# Patient Record
Sex: Female | Born: 1935 | Race: Black or African American | Hispanic: No | Smoking: Never smoker
Health system: Southern US, Community
[De-identification: ages and names within clinical notes are randomized; demographics above are authoritative.]

## PROBLEM LIST (undated history)

## (undated) DIAGNOSIS — N189 Chronic kidney disease, unspecified: Secondary | ICD-10-CM

## (undated) DIAGNOSIS — F0280 Dementia in other diseases classified elsewhere without behavioral disturbance: Secondary | ICD-10-CM

## (undated) DIAGNOSIS — K259 Gastric ulcer, unspecified as acute or chronic, without hemorrhage or perforation: Secondary | ICD-10-CM

## (undated) DIAGNOSIS — R001 Bradycardia, unspecified: Secondary | ICD-10-CM

## (undated) DIAGNOSIS — I96 Gangrene, not elsewhere classified: Secondary | ICD-10-CM

## (undated) DIAGNOSIS — I5020 Unspecified systolic (congestive) heart failure: Secondary | ICD-10-CM

## (undated) DIAGNOSIS — R4702 Dysphasia: Secondary | ICD-10-CM

## (undated) DIAGNOSIS — M1990 Unspecified osteoarthritis, unspecified site: Secondary | ICD-10-CM

## (undated) DIAGNOSIS — K269 Duodenal ulcer, unspecified as acute or chronic, without hemorrhage or perforation: Secondary | ICD-10-CM

## (undated) DIAGNOSIS — G309 Alzheimer's disease, unspecified: Secondary | ICD-10-CM

## (undated) DIAGNOSIS — K275 Chronic or unspecified peptic ulcer, site unspecified, with perforation: Secondary | ICD-10-CM

## (undated) DIAGNOSIS — F329 Major depressive disorder, single episode, unspecified: Secondary | ICD-10-CM

## (undated) DIAGNOSIS — I10 Essential (primary) hypertension: Secondary | ICD-10-CM

## (undated) DIAGNOSIS — T68XXXA Hypothermia, initial encounter: Secondary | ICD-10-CM

## (undated) DIAGNOSIS — F32A Depression, unspecified: Secondary | ICD-10-CM

## (undated) DIAGNOSIS — F419 Anxiety disorder, unspecified: Secondary | ICD-10-CM

## (undated) DIAGNOSIS — E039 Hypothyroidism, unspecified: Secondary | ICD-10-CM

## (undated) DIAGNOSIS — J9600 Acute respiratory failure, unspecified whether with hypoxia or hypercapnia: Secondary | ICD-10-CM

## (undated) DIAGNOSIS — L409 Psoriasis, unspecified: Secondary | ICD-10-CM

## (undated) DIAGNOSIS — I2510 Atherosclerotic heart disease of native coronary artery without angina pectoris: Secondary | ICD-10-CM

## (undated) HISTORY — PX: JOINT REPLACEMENT: SHX530

---

## 1997-09-04 ENCOUNTER — Ambulatory Visit (HOSPITAL_COMMUNITY): Admission: RE | Admit: 1997-09-04 | Discharge: 1997-09-04 | Payer: Self-pay | Admitting: Internal Medicine

## 1997-09-25 ENCOUNTER — Other Ambulatory Visit: Admission: RE | Admit: 1997-09-25 | Discharge: 1997-09-25 | Payer: Self-pay | Admitting: Family Medicine

## 1997-11-11 ENCOUNTER — Ambulatory Visit (HOSPITAL_COMMUNITY): Admission: RE | Admit: 1997-11-11 | Discharge: 1997-11-11 | Payer: Self-pay | Admitting: Family Medicine

## 1997-11-25 ENCOUNTER — Encounter: Admission: RE | Admit: 1997-11-25 | Discharge: 1998-02-23 | Payer: Self-pay | Admitting: Family Medicine

## 1999-03-11 ENCOUNTER — Other Ambulatory Visit: Admission: RE | Admit: 1999-03-11 | Discharge: 1999-03-11 | Payer: Self-pay | Admitting: Family Medicine

## 2000-01-28 ENCOUNTER — Encounter: Payer: Self-pay | Admitting: Family Medicine

## 2000-01-28 ENCOUNTER — Ambulatory Visit (HOSPITAL_COMMUNITY): Admission: RE | Admit: 2000-01-28 | Discharge: 2000-01-28 | Payer: Self-pay | Admitting: Family Medicine

## 2000-02-01 ENCOUNTER — Encounter: Payer: Self-pay | Admitting: Emergency Medicine

## 2000-02-01 ENCOUNTER — Emergency Department (HOSPITAL_COMMUNITY): Admission: EM | Admit: 2000-02-01 | Discharge: 2000-02-02 | Payer: Self-pay | Admitting: Emergency Medicine

## 2000-02-14 ENCOUNTER — Ambulatory Visit (HOSPITAL_COMMUNITY): Admission: RE | Admit: 2000-02-14 | Discharge: 2000-02-14 | Payer: Self-pay | Admitting: Family Medicine

## 2000-02-14 ENCOUNTER — Encounter: Payer: Self-pay | Admitting: Family Medicine

## 2000-06-01 ENCOUNTER — Ambulatory Visit (HOSPITAL_COMMUNITY): Admission: RE | Admit: 2000-06-01 | Discharge: 2000-06-01 | Payer: Self-pay | Admitting: Gastroenterology

## 2000-06-01 ENCOUNTER — Encounter (INDEPENDENT_AMBULATORY_CARE_PROVIDER_SITE_OTHER): Payer: Self-pay

## 2000-06-20 DIAGNOSIS — K275 Chronic or unspecified peptic ulcer, site unspecified, with perforation: Secondary | ICD-10-CM

## 2000-06-20 HISTORY — DX: Chronic or unspecified peptic ulcer, site unspecified, with perforation: K27.5

## 2000-10-11 ENCOUNTER — Encounter: Payer: Self-pay | Admitting: Emergency Medicine

## 2000-10-12 ENCOUNTER — Encounter: Payer: Self-pay | Admitting: Emergency Medicine

## 2000-10-12 ENCOUNTER — Inpatient Hospital Stay (HOSPITAL_COMMUNITY): Admission: EM | Admit: 2000-10-12 | Discharge: 2000-10-19 | Payer: Self-pay | Admitting: Emergency Medicine

## 2001-11-29 ENCOUNTER — Encounter: Admission: RE | Admit: 2001-11-29 | Discharge: 2001-11-29 | Payer: Self-pay | Admitting: Internal Medicine

## 2001-12-24 ENCOUNTER — Ambulatory Visit (HOSPITAL_COMMUNITY): Admission: RE | Admit: 2001-12-24 | Discharge: 2001-12-24 | Payer: Self-pay | Admitting: Internal Medicine

## 2001-12-24 ENCOUNTER — Encounter: Admission: RE | Admit: 2001-12-24 | Discharge: 2001-12-24 | Payer: Self-pay | Admitting: Internal Medicine

## 2002-03-22 ENCOUNTER — Ambulatory Visit (HOSPITAL_COMMUNITY): Admission: RE | Admit: 2002-03-22 | Discharge: 2002-03-23 | Payer: Self-pay | Admitting: Cardiology

## 2002-03-22 ENCOUNTER — Encounter: Payer: Self-pay | Admitting: Cardiology

## 2002-09-09 ENCOUNTER — Encounter: Admission: RE | Admit: 2002-09-09 | Discharge: 2002-09-09 | Payer: Self-pay | Admitting: Internal Medicine

## 2002-10-29 ENCOUNTER — Encounter: Admission: RE | Admit: 2002-10-29 | Discharge: 2002-10-29 | Payer: Self-pay | Admitting: Internal Medicine

## 2003-02-12 ENCOUNTER — Encounter: Admission: RE | Admit: 2003-02-12 | Discharge: 2003-02-12 | Payer: Self-pay | Admitting: Internal Medicine

## 2003-02-18 ENCOUNTER — Encounter: Admission: RE | Admit: 2003-02-18 | Discharge: 2003-02-18 | Payer: Self-pay | Admitting: Internal Medicine

## 2003-05-02 ENCOUNTER — Encounter: Admission: RE | Admit: 2003-05-02 | Discharge: 2003-05-02 | Payer: Self-pay | Admitting: Internal Medicine

## 2004-04-20 ENCOUNTER — Ambulatory Visit: Payer: Self-pay | Admitting: Internal Medicine

## 2004-10-12 ENCOUNTER — Ambulatory Visit: Payer: Self-pay | Admitting: Internal Medicine

## 2004-10-20 ENCOUNTER — Ambulatory Visit (HOSPITAL_COMMUNITY): Admission: RE | Admit: 2004-10-20 | Discharge: 2004-10-20 | Payer: Self-pay | Admitting: Internal Medicine

## 2004-10-20 ENCOUNTER — Ambulatory Visit: Payer: Self-pay | Admitting: Internal Medicine

## 2004-10-22 ENCOUNTER — Ambulatory Visit (HOSPITAL_COMMUNITY): Admission: RE | Admit: 2004-10-22 | Discharge: 2004-10-22 | Payer: Self-pay | Admitting: Internal Medicine

## 2004-11-03 ENCOUNTER — Ambulatory Visit: Payer: Self-pay | Admitting: Internal Medicine

## 2004-11-18 ENCOUNTER — Ambulatory Visit: Payer: Self-pay | Admitting: Internal Medicine

## 2005-01-06 ENCOUNTER — Ambulatory Visit: Payer: Self-pay | Admitting: Internal Medicine

## 2005-01-13 ENCOUNTER — Ambulatory Visit (HOSPITAL_COMMUNITY): Admission: RE | Admit: 2005-01-13 | Discharge: 2005-01-13 | Payer: Self-pay | Admitting: Internal Medicine

## 2005-01-13 ENCOUNTER — Ambulatory Visit: Payer: Self-pay | Admitting: Internal Medicine

## 2005-01-27 ENCOUNTER — Ambulatory Visit: Payer: Self-pay | Admitting: Internal Medicine

## 2005-02-14 ENCOUNTER — Ambulatory Visit: Payer: Self-pay | Admitting: Cardiology

## 2005-02-24 ENCOUNTER — Ambulatory Visit (HOSPITAL_COMMUNITY): Admission: RE | Admit: 2005-02-24 | Discharge: 2005-02-24 | Payer: Self-pay | Admitting: *Deleted

## 2005-02-24 ENCOUNTER — Ambulatory Visit: Payer: Self-pay | Admitting: Cardiology

## 2005-03-02 ENCOUNTER — Ambulatory Visit: Payer: Self-pay | Admitting: Cardiology

## 2005-03-07 ENCOUNTER — Inpatient Hospital Stay (HOSPITAL_BASED_OUTPATIENT_CLINIC_OR_DEPARTMENT_OTHER): Admission: RE | Admit: 2005-03-07 | Discharge: 2005-03-07 | Payer: Self-pay | Admitting: Internal Medicine

## 2005-03-17 ENCOUNTER — Ambulatory Visit: Payer: Self-pay | Admitting: Cardiology

## 2005-06-20 HISTORY — PX: TOTAL HIP ARTHROPLASTY: SHX124

## 2005-07-19 ENCOUNTER — Ambulatory Visit: Payer: Self-pay | Admitting: Hospitalist

## 2005-07-25 ENCOUNTER — Ambulatory Visit: Payer: Self-pay | Admitting: Internal Medicine

## 2005-08-08 ENCOUNTER — Ambulatory Visit: Payer: Self-pay | Admitting: Internal Medicine

## 2005-09-05 ENCOUNTER — Emergency Department (HOSPITAL_COMMUNITY): Admission: EM | Admit: 2005-09-05 | Discharge: 2005-09-05 | Payer: Self-pay | Admitting: Emergency Medicine

## 2005-09-29 ENCOUNTER — Ambulatory Visit: Payer: Self-pay | Admitting: Internal Medicine

## 2005-10-06 ENCOUNTER — Ambulatory Visit: Payer: Self-pay | Admitting: Internal Medicine

## 2005-11-24 ENCOUNTER — Inpatient Hospital Stay (HOSPITAL_COMMUNITY): Admission: RE | Admit: 2005-11-24 | Discharge: 2005-11-27 | Payer: Self-pay | Admitting: Orthopedic Surgery

## 2005-12-09 ENCOUNTER — Ambulatory Visit: Payer: Self-pay | Admitting: Internal Medicine

## 2006-03-08 ENCOUNTER — Ambulatory Visit (HOSPITAL_COMMUNITY): Admission: RE | Admit: 2006-03-08 | Discharge: 2006-03-08 | Payer: Self-pay | Admitting: Internal Medicine

## 2006-03-08 ENCOUNTER — Ambulatory Visit: Payer: Self-pay | Admitting: Internal Medicine

## 2006-03-15 ENCOUNTER — Ambulatory Visit: Payer: Self-pay | Admitting: Internal Medicine

## 2006-04-07 ENCOUNTER — Ambulatory Visit: Payer: Self-pay | Admitting: Internal Medicine

## 2006-04-07 ENCOUNTER — Inpatient Hospital Stay (HOSPITAL_COMMUNITY): Admission: EM | Admit: 2006-04-07 | Discharge: 2006-04-13 | Payer: Self-pay | Admitting: Emergency Medicine

## 2006-04-29 ENCOUNTER — Emergency Department (HOSPITAL_COMMUNITY): Admission: EM | Admit: 2006-04-29 | Discharge: 2006-04-29 | Payer: Self-pay | Admitting: Emergency Medicine

## 2006-06-29 DIAGNOSIS — M1990 Unspecified osteoarthritis, unspecified site: Secondary | ICD-10-CM | POA: Insufficient documentation

## 2006-06-29 DIAGNOSIS — R634 Abnormal weight loss: Secondary | ICD-10-CM

## 2006-06-29 DIAGNOSIS — Z9861 Coronary angioplasty status: Secondary | ICD-10-CM | POA: Insufficient documentation

## 2006-06-29 DIAGNOSIS — L408 Other psoriasis: Secondary | ICD-10-CM | POA: Insufficient documentation

## 2006-06-29 DIAGNOSIS — E7800 Pure hypercholesterolemia, unspecified: Secondary | ICD-10-CM

## 2006-06-29 DIAGNOSIS — K279 Peptic ulcer, site unspecified, unspecified as acute or chronic, without hemorrhage or perforation: Secondary | ICD-10-CM

## 2006-06-29 DIAGNOSIS — E1129 Type 2 diabetes mellitus with other diabetic kidney complication: Secondary | ICD-10-CM | POA: Insufficient documentation

## 2006-06-29 DIAGNOSIS — I10 Essential (primary) hypertension: Secondary | ICD-10-CM | POA: Insufficient documentation

## 2006-06-29 DIAGNOSIS — Z8659 Personal history of other mental and behavioral disorders: Secondary | ICD-10-CM | POA: Insufficient documentation

## 2006-06-29 DIAGNOSIS — Z9079 Acquired absence of other genital organ(s): Secondary | ICD-10-CM | POA: Insufficient documentation

## 2006-06-29 DIAGNOSIS — E669 Obesity, unspecified: Secondary | ICD-10-CM

## 2006-06-29 DIAGNOSIS — Z96649 Presence of unspecified artificial hip joint: Secondary | ICD-10-CM | POA: Insufficient documentation

## 2006-06-29 DIAGNOSIS — R413 Other amnesia: Secondary | ICD-10-CM

## 2006-06-29 DIAGNOSIS — Z9889 Other specified postprocedural states: Secondary | ICD-10-CM | POA: Insufficient documentation

## 2006-08-09 ENCOUNTER — Telehealth: Payer: Self-pay | Admitting: *Deleted

## 2006-08-09 DIAGNOSIS — E785 Hyperlipidemia, unspecified: Secondary | ICD-10-CM | POA: Insufficient documentation

## 2007-01-29 ENCOUNTER — Ambulatory Visit: Payer: Self-pay | Admitting: *Deleted

## 2007-01-29 ENCOUNTER — Encounter (INDEPENDENT_AMBULATORY_CARE_PROVIDER_SITE_OTHER): Payer: Self-pay | Admitting: *Deleted

## 2007-01-29 LAB — CONVERTED CEMR LAB
ALT: 10 units/L (ref 0–35)
Alkaline Phosphatase: 90 units/L (ref 39–117)
BUN: 15 mg/dL (ref 6–23)
Blood Glucose, Fingerstick: 117
CO2: 24 meq/L (ref 19–32)
Creatinine, Ser: 1.05 mg/dL (ref 0.40–1.20)
Glucose, Bld: 78 mg/dL (ref 70–99)
Hemoglobin: 12.6 g/dL (ref 12.0–15.0)
MCV: 79 fL (ref 78.0–100.0)
Platelets: 221 10*3/uL (ref 150–400)
RBC: 4.8 M/uL (ref 3.87–5.11)
Total Protein: 7.1 g/dL (ref 6.0–8.3)
WBC: 5.7 10*3/uL (ref 4.0–10.5)

## 2007-04-12 ENCOUNTER — Encounter: Payer: Self-pay | Admitting: Internal Medicine

## 2007-04-26 ENCOUNTER — Encounter (INDEPENDENT_AMBULATORY_CARE_PROVIDER_SITE_OTHER): Payer: Self-pay | Admitting: *Deleted

## 2007-05-24 ENCOUNTER — Telehealth: Payer: Self-pay | Admitting: Internal Medicine

## 2007-05-31 ENCOUNTER — Ambulatory Visit: Payer: Self-pay | Admitting: Internal Medicine

## 2007-05-31 ENCOUNTER — Encounter: Payer: Self-pay | Admitting: Licensed Clinical Social Worker

## 2007-05-31 DIAGNOSIS — I2510 Atherosclerotic heart disease of native coronary artery without angina pectoris: Secondary | ICD-10-CM

## 2007-05-31 DIAGNOSIS — D126 Benign neoplasm of colon, unspecified: Secondary | ICD-10-CM

## 2007-05-31 LAB — CONVERTED CEMR LAB
ALT: <8 units/L (ref 0–35)
Albumin: 4 g/dL (ref 3.5–5.2)
BUN: 20 mg/dL (ref 6–23)
Basophils Relative: 1 % (ref 0–1)
Blood Glucose, Fingerstick: 121
Chloride: 107 meq/L (ref 96–112)
Cholesterol: 149 mg/dL (ref 0–200)
Creatinine, Ser: 1.05 mg/dL (ref 0.40–1.20)
Eosinophils Absolute: 0.2 10*3/uL (ref 0.2–0.7)
HCT: 37.3 % (ref 36.0–46.0)
HDL: 62 mg/dL (ref >39)
Lymphocytes Relative: 23 % (ref 12–46)
MCHC: 33.2 g/dL (ref 30.0–36.0)
Monocytes Relative: 8 % (ref 3–12)
Neutro Abs: 4 10*3/uL (ref 1.7–7.7)
Potassium: 4.7 meq/L (ref 3.5–5.3)
RBC: 4.62 M/uL (ref 3.87–5.11)
RDW: 16.1 % — ABNORMAL HIGH (ref 11.5–15.5)
Sodium: 143 meq/L (ref 135–145)
TSH: 1.704 microintl units/mL (ref 0.350–5.50)
Total CHOL/HDL Ratio: 2.4
Total Protein: 7.5 g/dL (ref 6.0–8.3)
Triglycerides: 95 mg/dL (ref <150)

## 2007-06-04 LAB — CONVERTED CEMR LAB: OCCULT 3: NEGATIVE

## 2007-06-07 ENCOUNTER — Ambulatory Visit: Payer: Self-pay | Admitting: Internal Medicine

## 2007-06-21 DIAGNOSIS — K259 Gastric ulcer, unspecified as acute or chronic, without hemorrhage or perforation: Secondary | ICD-10-CM

## 2007-06-21 DIAGNOSIS — K269 Duodenal ulcer, unspecified as acute or chronic, without hemorrhage or perforation: Secondary | ICD-10-CM

## 2007-06-21 HISTORY — DX: Duodenal ulcer, unspecified as acute or chronic, without hemorrhage or perforation: K26.9

## 2007-06-21 HISTORY — DX: Gastric ulcer, unspecified as acute or chronic, without hemorrhage or perforation: K25.9

## 2007-06-29 ENCOUNTER — Encounter: Payer: Self-pay | Admitting: Internal Medicine

## 2007-08-07 ENCOUNTER — Telehealth: Payer: Self-pay | Admitting: Internal Medicine

## 2007-08-15 ENCOUNTER — Ambulatory Visit: Payer: Self-pay | Admitting: *Deleted

## 2007-08-15 ENCOUNTER — Encounter: Payer: Self-pay | Admitting: Internal Medicine

## 2007-08-15 ENCOUNTER — Ambulatory Visit: Payer: Self-pay | Admitting: Cardiology

## 2007-08-15 ENCOUNTER — Encounter: Admission: RE | Admit: 2007-08-15 | Discharge: 2007-08-15 | Payer: Self-pay | Admitting: Internal Medicine

## 2007-08-15 ENCOUNTER — Inpatient Hospital Stay (HOSPITAL_COMMUNITY): Admission: AD | Admit: 2007-08-15 | Discharge: 2007-08-18 | Payer: Self-pay | Admitting: *Deleted

## 2007-08-15 ENCOUNTER — Ambulatory Visit: Payer: Self-pay | Admitting: Internal Medicine

## 2007-08-15 DIAGNOSIS — R079 Chest pain, unspecified: Secondary | ICD-10-CM

## 2007-08-15 LAB — CONVERTED CEMR LAB: Hgb A1c MFr Bld: 6.3 %

## 2007-08-16 ENCOUNTER — Encounter (INDEPENDENT_AMBULATORY_CARE_PROVIDER_SITE_OTHER): Payer: Self-pay | Admitting: *Deleted

## 2007-08-17 ENCOUNTER — Encounter: Payer: Self-pay | Admitting: Gastroenterology

## 2007-08-17 ENCOUNTER — Encounter: Payer: Self-pay | Admitting: Internal Medicine

## 2007-08-17 DIAGNOSIS — K2980 Duodenitis without bleeding: Secondary | ICD-10-CM | POA: Insufficient documentation

## 2007-08-17 LAB — HM COLONOSCOPY

## 2007-08-20 ENCOUNTER — Ambulatory Visit: Payer: Self-pay | Admitting: Gastroenterology

## 2007-08-23 ENCOUNTER — Encounter: Payer: Self-pay | Admitting: Internal Medicine

## 2007-08-23 LAB — HM DIABETES EYE EXAM

## 2007-09-14 DIAGNOSIS — K5730 Diverticulosis of large intestine without perforation or abscess without bleeding: Secondary | ICD-10-CM | POA: Insufficient documentation

## 2007-09-17 ENCOUNTER — Encounter: Payer: Self-pay | Admitting: Internal Medicine

## 2007-09-17 ENCOUNTER — Ambulatory Visit: Payer: Self-pay

## 2007-10-09 ENCOUNTER — Telehealth: Payer: Self-pay | Admitting: Internal Medicine

## 2007-10-24 ENCOUNTER — Emergency Department (HOSPITAL_COMMUNITY): Admission: EM | Admit: 2007-10-24 | Discharge: 2007-10-24 | Payer: Self-pay | Admitting: Emergency Medicine

## 2007-10-31 ENCOUNTER — Ambulatory Visit: Payer: Self-pay | Admitting: Internal Medicine

## 2007-10-31 LAB — CONVERTED CEMR LAB
ALT: 10 units/L (ref 0–35)
Alkaline Phosphatase: 116 units/L (ref 39–117)
Basophils Relative: 1 % (ref 0–1)
Blood Glucose, Fingerstick: 104
Calcium: 9.6 mg/dL (ref 8.4–10.5)
Chloride: 107 meq/L (ref 96–112)
Eosinophils Absolute: 1 10*3/uL — ABNORMAL HIGH (ref 0.0–0.7)
Eosinophils Relative: 15 % — ABNORMAL HIGH (ref 0–5)
HCT: 37.7 % (ref 36.0–46.0)
Hgb A1c MFr Bld: 6.1 %
Lymphocytes Relative: 17 % (ref 12–46)
Lymphs Abs: 1.1 10*3/uL (ref 0.7–4.0)
MCHC: 32.9 g/dL (ref 30.0–36.0)
Monocytes Absolute: 0.5 10*3/uL (ref 0.1–1.0)
Monocytes Relative: 8 % (ref 3–12)
Neutro Abs: 3.9 10*3/uL (ref 1.7–7.7)
Potassium: 5.1 meq/L (ref 3.5–5.3)
RBC: 4.71 M/uL (ref 3.87–5.11)
Total Bilirubin: 1 mg/dL (ref 0.3–1.2)
Total Protein: 8 g/dL (ref 6.0–8.3)
WBC: 6.6 10*3/uL (ref 4.0–10.5)

## 2007-11-22 ENCOUNTER — Encounter: Payer: Self-pay | Admitting: Internal Medicine

## 2008-06-09 ENCOUNTER — Telehealth: Payer: Self-pay | Admitting: Internal Medicine

## 2008-08-11 ENCOUNTER — Telehealth: Payer: Self-pay | Admitting: Internal Medicine

## 2008-09-03 ENCOUNTER — Ambulatory Visit: Payer: Self-pay | Admitting: Internal Medicine

## 2008-09-03 LAB — CONVERTED CEMR LAB
BUN: 12 mg/dL (ref 6–23)
Basophils Absolute: 0 10*3/uL (ref 0.0–0.1)
Basophils Relative: 1 % (ref 0–1)
CO2: 25 meq/L (ref 19–32)
Calcium: 9 mg/dL (ref 8.4–10.5)
Chloride: 104 meq/L (ref 96–112)
Creatinine, Ser: 1 mg/dL (ref 0.40–1.20)
HCT: 36.3 % (ref 36.0–46.0)
HDL: 51 mg/dL (ref >39)
Hgb A1c MFr Bld: 6 %
LDL Cholesterol: 129 mg/dL — ABNORMAL HIGH (ref 0–99)
Lymphocytes Relative: 17 % (ref 12–46)
Lymphs Abs: 0.9 10*3/uL (ref 0.7–4.0)
MCV: 75.2 fL — ABNORMAL LOW (ref 78.0–100.0)
Monocytes Absolute: 0.4 10*3/uL (ref 0.1–1.0)
Monocytes Relative: 8 % (ref 3–12)
Neutro Abs: 3.7 10*3/uL (ref 1.7–7.7)
Neutrophils Relative %: 71 % (ref 43–77)
Potassium: 4.4 meq/L (ref 3.5–5.3)
RBC: 4.83 M/uL (ref 3.87–5.11)
Total Bilirubin: 0.6 mg/dL (ref 0.3–1.2)
Total CHOL/HDL Ratio: 3.9
Total Protein: 7.9 g/dL (ref 6.0–8.3)
VLDL: 19 mg/dL (ref 0–40)

## 2008-09-08 ENCOUNTER — Ambulatory Visit (HOSPITAL_COMMUNITY): Admission: RE | Admit: 2008-09-08 | Discharge: 2008-09-08 | Payer: Self-pay | Admitting: Internal Medicine

## 2008-09-10 ENCOUNTER — Telehealth (INDEPENDENT_AMBULATORY_CARE_PROVIDER_SITE_OTHER): Payer: Self-pay | Admitting: Pharmacy Technician

## 2008-11-20 ENCOUNTER — Telehealth: Payer: Self-pay | Admitting: Internal Medicine

## 2009-02-27 ENCOUNTER — Ambulatory Visit: Payer: Self-pay | Admitting: Infectious Diseases

## 2009-03-13 ENCOUNTER — Ambulatory Visit (HOSPITAL_COMMUNITY): Admission: RE | Admit: 2009-03-13 | Discharge: 2009-03-13 | Payer: Self-pay | Admitting: Internal Medicine

## 2009-03-13 ENCOUNTER — Encounter: Payer: Self-pay | Admitting: Internal Medicine

## 2009-03-31 ENCOUNTER — Ambulatory Visit: Payer: Self-pay | Admitting: Internal Medicine

## 2009-04-02 LAB — CONVERTED CEMR LAB
ALT: 9 units/L (ref 0–35)
Albumin: 4.4 g/dL (ref 3.5–5.2)
BUN: 20 mg/dL (ref 6–23)
CO2: 26 meq/L (ref 19–32)
Chloride: 103 meq/L (ref 96–112)
Cholesterol: 210 mg/dL — ABNORMAL HIGH (ref 0–200)
Glucose, Bld: 91 mg/dL (ref 70–99)
HDL: 50 mg/dL (ref >39)
LDL Cholesterol: 142 mg/dL — ABNORMAL HIGH (ref 0–99)
Potassium: 4.3 meq/L (ref 3.5–5.3)
Total CHOL/HDL Ratio: 4.2

## 2009-04-14 ENCOUNTER — Ambulatory Visit: Payer: Self-pay | Admitting: Internal Medicine

## 2009-04-15 ENCOUNTER — Telehealth: Payer: Self-pay | Admitting: Internal Medicine

## 2009-04-23 ENCOUNTER — Telehealth: Payer: Self-pay | Admitting: Internal Medicine

## 2009-07-06 ENCOUNTER — Telehealth: Payer: Self-pay | Admitting: *Deleted

## 2009-07-29 ENCOUNTER — Ambulatory Visit: Payer: Self-pay | Admitting: Internal Medicine

## 2009-07-29 DIAGNOSIS — R718 Other abnormality of red blood cells: Secondary | ICD-10-CM

## 2009-07-29 LAB — CONVERTED CEMR LAB: Hgb A1c MFr Bld: 6.4 %

## 2009-09-29 ENCOUNTER — Telehealth: Payer: Self-pay | Admitting: Internal Medicine

## 2009-11-19 ENCOUNTER — Telehealth: Payer: Self-pay | Admitting: *Deleted

## 2009-11-25 ENCOUNTER — Ambulatory Visit: Payer: Self-pay | Admitting: Internal Medicine

## 2009-11-25 LAB — CONVERTED CEMR LAB
Albumin: 4.1 g/dL (ref 3.5–5.2)
BUN: 13 mg/dL (ref 6–23)
Basophils Absolute: 0 10*3/uL (ref 0.0–0.1)
CO2: 22 meq/L (ref 19–32)
Calcium: 9.2 mg/dL (ref 8.4–10.5)
Chloride: 109 meq/L (ref 96–112)
Cholesterol: 168 mg/dL (ref 0–200)
Creatinine, Ser: 0.97 mg/dL (ref 0.40–1.20)
Eosinophils Relative: 7 % — ABNORMAL HIGH (ref 0–5)
Glucose, Bld: 88 mg/dL (ref 70–99)
HCT: 36.8 % (ref 36.0–46.0)
HDL: 50 mg/dL (ref >39)
Hemoglobin: 12.2 g/dL (ref 12.0–15.0)
LDL Cholesterol: 105 mg/dL — ABNORMAL HIGH (ref 0–99)
Lymphocytes Relative: 19 % (ref 12–46)
Lymphs Abs: 0.8 10*3/uL (ref 0.7–4.0)
Monocytes Absolute: 0.3 10*3/uL (ref 0.1–1.0)
Neutro Abs: 2.8 10*3/uL (ref 1.7–7.7)
Potassium: 3.9 meq/L (ref 3.5–5.3)
RDW: 16.4 % — ABNORMAL HIGH (ref 11.5–15.5)
Triglycerides: 66 mg/dL (ref <150)
VLDL: 13 mg/dL (ref 0–40)
WBC: 4.3 10*3/uL (ref 4.0–10.5)

## 2009-12-09 ENCOUNTER — Ambulatory Visit: Payer: Self-pay | Admitting: Internal Medicine

## 2009-12-09 LAB — HM DIABETES FOOT EXAM

## 2010-01-09 ENCOUNTER — Emergency Department (HOSPITAL_COMMUNITY): Admission: EM | Admit: 2010-01-09 | Discharge: 2010-01-09 | Payer: Self-pay | Admitting: Family Medicine

## 2010-07-07 ENCOUNTER — Ambulatory Visit: Admit: 2010-07-07 | Payer: Self-pay | Admitting: Internal Medicine

## 2010-07-10 ENCOUNTER — Encounter: Payer: Self-pay | Admitting: Internal Medicine

## 2010-07-11 ENCOUNTER — Encounter: Payer: Self-pay | Admitting: Internal Medicine

## 2010-07-20 NOTE — Progress Notes (Signed)
Summary: refill/ hla  Phone Note Refill Request Message from:  Fax from Pharmacy on July 06, 2009 5:14 PM  Refills Requested: Medication #1:  PRAVACHOL 40 MG TABS take 1 pill by mouth daily.   Last Refilled: 12/4  Medication #2:  FUROSEMIDE 40 MG TABS Take 1 tablet by mouth once a day Initial call taken by: Marin Roberts RN,  July 06, 2009 5:15 PM  Follow-up for Phone Call        Refilled electronically. Please remind patient to keep appointment as scheduled in February. Follow-up by: Margarito Liner MD,  July 14, 2009 7:39 PM    Prescriptions: FUROSEMIDE 40 MG TABS (FUROSEMIDE) Take 1 tablet by mouth once a day  #30 Each x 0   Entered and Authorized by:   Margarito Liner MD   Signed by:   Margarito Liner MD on 07/14/2009   Method used:   Electronically to        [facility] [phone]* (retail)       [address]       [place], [place]  [address]       Ph: [phone]       Fax: [phone]   RxID:   [id number] PRAVACHOL 40 MG TABS (PRAVASTATIN SODIUM) take 1 pill by mouth daily.  #30 x 0   Entered and Authorized by:   Margarito Liner MD   Signed by:   Margarito Liner MD on 07/14/2009   Method used:   Electronically to        [facility] [phone]* (retail)       [address]       [place], [place]  [address]       Ph: [phone]       Fax: [phone]   RxID:   [id number]

## 2010-07-20 NOTE — Assessment & Plan Note (Signed)
Summary: EST-CK/FU/MEDS/CFB   Vital Signs:  Patient profile:   77 year old female Height:      69 inches Weight:      202.8 pounds BMI:     30.06 Temp:     97.8 degrees F oral Pulse rate:   66 / minute BP sitting:   160 / 91  (right arm)  Vitals Entered By: Filomena Jungling NT II (July 29, 2009 9:18 AM) CC: checkup Is Patient Diabetic? Yes Pain Assessment Patient in pain? no      Nutritional Status BMI of > 30 = obese  Does patient need assistance? Functional Status Self care Ambulation Normal   Primary Care Provider:  Dr. Meredith Pel  CC:  checkup.  History of Present Illness: Patient returns for follow-up of her hypertension, hyperlipidemia, diet-controlled diabetes mellitus, severe psoriasis, and other chronic medical problems.  She was accompanied to clinic today by her daughter.  Her main complaint is itching related to her psoriasis, which is extensive and involved her trunk and extremities.  Although we advised dermatology referral last year, she has apparently not yet seen a dermatologist.  She reports that she is compliant with her medications.  Preventive Screening-Counseling & Management  Alcohol-Tobacco     Smoking Status: quit     Smoking Cessation Counseling: no     Year Started: used to smoke 'years ago'; dip snuff     Cans of tobacco/week: yes     Passive Smoke Exposure: no  Caffeine-Diet-Exercise     Does Patient Exercise: no  Current Medications (verified): 1)  Lisinopril 10 Mg Tabs (Lisinopril) .... Take 4 Tablets By Mouth Two Times A Day 2)  Pravachol 40 Mg Tabs (Pravastatin Sodium) .... Take 1 Pill By Mouth Daily. 3)  Aspirin 325 Mg Tabs (Aspirin) .... Take 1 Tablet By Mouth Once A Day 4)  Furosemide 40 Mg Tabs (Furosemide) .... Take 1 Tablet By Mouth Once A Day 5)  Coreg 6.25 Mg Tabs (Carvedilol) .... Take 1 Pill By Mouth Two Times A Day. 6)  Amlodipine Besylate 10 Mg  Tabs (Amlodipine Besylate) .... Take 1 Tablet By Mouth Once A Day 7)  Klor-Con  M20 20 Meq Cr-Tabs (Potassium Chloride Crys Cr) .... Take 2 Pills By Mouth Daily.  Allergies (verified): No Known Drug Allergies  Past History:  Past Medical History: Coronary artery disease, S/P PTCA of RCA 03/2002 by Dr. Elsie Lincoln; moderate multivessel CAD by cath 02/2005 by Dr. Eden Emms Diabetes mellitus, type II Diverticulosis, colon Hypertension Psoriasis Hyperlipidemia Degenerative joint disease Peptic ulcer disease Obesity Memory Loss Adenomatous colonic polyp by colonoscopy 05/2000 by Dr. Randa Evens; last colonoscopy was in 2009 by Dr. Melvia Heaps  Review of Systems CV:  Denies chest pain or discomfort. Resp:  Denies shortness of breath. GI:  Denies abdominal pain, nausea, and vomiting. MS:  Denies muscle aches and cramps.  Physical Exam  General:  alert, no distress Lungs:  normal respiratory effort, normal breath sounds, no crackles, and no wheezes.   Heart:  normal rate, regular rhythm, no murmur, no gallop, and no rub.   Abdomen:  soft, non-tender, and normal bowel sounds.   Extremities:  no edema  Diabetes Management Exam:    Foot Exam (with socks and/or shoes not present):       Sensory-Monofilament:          Left foot: normal          Right foot: normal   Impression & Recommendations:  Problem # 1:  DIABETES MELLITUS,  TYPE II (ICD-250.00) Patient's diabetes mellitus is well controlled on diet alone.  Plan is to continue current management.  Her updated medication list for this problem includes:    Lisinopril 10 Mg Tabs (Lisinopril) .Marland Kitchen... Take 4 tablets by mouth two times a day    Aspirin 325 Mg Tabs (Aspirin) .Marland Kitchen... Take 1 tablet by mouth once a day  Labs Reviewed: Creat: 1.00 (03/31/2009)     Last Eye Exam: No diabetic retinopathy.   Cataract OD.   Visual acuity OD (best corrected):     20/40 Visual acuity OS (best corrected):     20/40 -1 Intraocular pressure OD:     WNL Intraocular pressure OS:     WNL  Exam by Dr. Loraine Leriche [facility]  (08/23/2007) Reviewed HgBA1c results: 6.4 (07/29/2009)  6.0 (09/03/2008)  Orders: T- Capillary Blood Glucose ([id number]) T-Hgb A1C (in-house) ([id number])Future Orders: T-Urine Microalbumin w/creat. ratio [id number]) ... 07/30/2009 T-Comprehensive Metabolic Panel [id number]) ... 07/30/2009  Problem # 2:  HYPERTENSION (ICD-401.9)  Patient's blood pressure is moderately elevated.  The plan is to increase carvedilol to a dose of 12.5 mg b.i.d.  Her updated medication list for this problem includes:    Lisinopril 10 Mg Tabs (Lisinopril) .Marland Kitchen... Take 4 tablets by mouth two times a day    Furosemide 40 Mg Tabs (Furosemide) .Marland Kitchen... Take 1 tablet by mouth once a day    Carvedilol 12.5 Mg Tabs (Carvedilol) .Marland Kitchen... Take 1 tablet by mouth two times a day    Amlodipine Besylate 10 Mg Tabs (Amlodipine besylate) .Marland Kitchen... Take 1 tablet by mouth once a day  BP today: 160/91 Prior BP: 179/86 (04/14/2009)  10 Yr Risk Heart Disease: 27 %  Labs Reviewed: K+: 4.3 (03/31/2009) Creat: : 1.00 (03/31/2009)   Chol: 210 (03/31/2009)   HDL: 50 (03/31/2009)   LDL: 142 (03/31/2009)   TG: 88 (03/31/2009)  Problem # 3:  HYPERLIPIDEMIA (ICD-272.4) Patient is doing well on current dose of pravastatin with no apparent side effects.  She will return for a fasting lipid panel within one week.  Her updated medication list for this problem includes:    Pravachol 40 Mg Tabs (Pravastatin sodium) .Marland Kitchen... Take 1 pill by mouth daily.  Future Orders: T-Lipid Profile [id number]) ... 07/30/2009  Labs Reviewed: SGOT: 17 (03/31/2009)   SGPT: 9 (03/31/2009)  10 Yr Risk Heart Disease: 27 %   HDL:50 (03/31/2009), 51 (09/03/2008)  LDL:142 (03/31/2009), 129 (09/03/2008)  Chol:210 (03/31/2009), 199 (09/03/2008)  Trig:88 (03/31/2009), 97 (09/03/2008)  Problem # 4:  PSORIASIS (ICD-696.1) Patient has extensive psoriasis, and has not seen a dermatologist in over a year.  The plan is to refer for dermatology evaluation and  treatment.  Orders: Dermatology Referral (Derma)  Problem # 5:  MICROCYTOSIS (ICD-790.09) Will check a CBC and ferritin level today.  Future Orders: T-CBC w/Diff [id number]) ... 07/30/2009 T-Ferritin ([id number]) ... 07/30/2009  Complete Medication List: 1)  Lisinopril 10 Mg Tabs (Lisinopril) .... Take 4 tablets by mouth two times a day 2)  Pravachol 40 Mg Tabs (Pravastatin sodium) .... Take 1 pill by mouth daily. 3)  Aspirin 325 Mg Tabs (Aspirin) .... Take 1 tablet by mouth once a day 4)  Furosemide 40 Mg Tabs (Furosemide) .... Take 1 tablet by mouth once a day 5)  Carvedilol 12.5 Mg Tabs (Carvedilol) .... Take 1 tablet by mouth two times a day 6)  Amlodipine Besylate 10 Mg Tabs (Amlodipine besylate) .... Take 1 tablet by mouth once a day 7)  Klor-con  M20 20 Meq Cr-tabs (Potassium chloride crys cr) .... Take 2 pills by mouth daily.  Other Orders: Pneumococcal Vaccine (16109) Admin 1st Vaccine (60454)  Patient Instructions: 1)  Please schedule a follow-up appointment in 2 months. 2)  Please return within 1 week for fasting labs. 3)  Increase carvedilol (Coreg) to a dose of 12.5 mg one tablet two times a day . 4)  Please keep appointment with dermatologist. 5)  Please make an appointment with your podiatrist for nail and foot care. Prescriptions: CARVEDILOL 12.5 MG TABS (CARVEDILOL) Take 1 tablet by mouth two times a day  #60 x 5   Entered and Authorized by:   Margarito Liner MD   Signed by:   Margarito Liner MD on 07/30/2009   Method used:   Electronically to        [facility] [phone]* (retail)       [address]       [place], [place]  [address]       Ph: [phone]       Fax: [phone]   RxID:   [id number]   Prevention & Chronic Care Immunizations   Influenza vaccine: Fluvax MCR  (04/14/2009)    Tetanus booster: Not documented    Pneumococcal vaccine: Pneumovax (Medicare)  (07/29/2009)    H. zoster vaccine: Not documented  Colorectal Screening    Hemoccult: negative x 3  (06/07/2007)   Hemoccult action/deferral: Deferred  (07/29/2009)   Hemoccult due: 06/2008    Colonoscopy: Diffuse diverticulosis. Exam by Barbette Hair. Arlyce Dice, M.D.  (08/17/2007)  Other Screening   Pap smear: Not documented   Pap smear action/deferral: Not indicated S/P hysterectomy  (07/29/2009)    Mammogram: ASSESSMENT: Negative - BI-RADS 1^MM DIGITAL SCREENING  (09/08/2008)    DXA bone density scan: Lumbar spine T score was -1.4, consistent with osteopenia.  (03/13/2009)   DXA bone density action/deferral: Ordered  (02/27/2009)  Reports requested:  Smoking status: quit  (07/29/2009)  Diabetes Mellitus   HgbA1C: 6.4  (07/29/2009)    Eye exam: No diabetic retinopathy.   Cataract OD.   Visual acuity OD (best corrected):     20/40 Visual acuity OS (best corrected):     20/40 -1 Intraocular pressure OD:     WNL Intraocular pressure OS:     WNL  Exam by Dr. Loraine Leriche [facility]   (08/23/2007)   Last eye exam report requested.   Eye exam due: 08/2008    Foot exam: yes  (07/29/2009)   Foot exam action/deferral: Do today   High risk foot: Yes  (07/29/2009)   Foot care education: Done  (07/29/2009)   Foot exam due: 07/29/2010    Urine microalbumin/creatinine ratio: Not documented   Urine microalbumin action/deferral: Ordered    Diabetes flowsheet reviewed?: Yes   Progress toward A1C goal: At goal  Lipids   Total Cholesterol: 210  (03/31/2009)   Lipid panel action/deferral: Lipid Panel ordered   LDL: 142  (03/31/2009)   LDL Direct: Not documented   HDL: 50  (03/31/2009)   Triglycerides: 88  (03/31/2009)    SGOT (AST): 17  (03/31/2009)   BMP action: Ordered   SGPT (ALT): 9  (03/31/2009) CMP ordered    Alkaline phosphatase: 102  (03/31/2009)   Total bilirubin: 0.7  (03/31/2009)    Lipid flowsheet reviewed?: Yes   Progress toward LDL goal: Unchanged  Hypertension   Last Blood Pressure: 160 / 91  (07/29/2009)   Serum creatinine:  1.00  (03/31/2009)  BMP action: Ordered   Serum potassium 4.3  (03/31/2009) CMP ordered     Hypertension flowsheet reviewed?: Yes   Progress toward BP goal: Unchanged  Self-Management Support :   Personal Goals (by the next clinic visit) :     Personal A1C goal: 7  (03/31/2009)     Personal blood pressure goal: 130/80  (07/29/2009)     Personal LDL goal: 70  (03/31/2009)    Patient will work on the following items until the next clinic visit to reach self-care goals:     Medications and monitoring: take my medicines every day  (07/29/2009)     Eating: eat more vegetables, use fresh or frozen vegetables, eat foods that are low in salt, eat baked foods instead of fried foods  (02/27/2009)    Diabetes self-management support: Written self-care plan  (07/29/2009)   Diabetes care plan printed    Hypertension self-management support: Written self-care plan  (07/29/2009)   Hypertension self-care plan printed.    Lipid self-management support: Written self-care plan  (07/29/2009)   Lipid self-care plan printed.   Nursing Instructions: Give Pneumovax today Diabetic foot exam today Request report of last diabetic eye exam     Diabetic Foot Exam Foot Inspection Is there a history of a foot ulcer?              No Is there a foot ulcer now?              No Can the patient see the bottom of their feet?          Yes Are the shoes appropriate in style and fit?          No Is there swelling or an abnormal foot shape?          No Are the toenails long?                Yes Are the toenails thick?                Yes Is there heavy callous build-up?              Yes Is there pain in the calf muscle (Intermittent claudication) when walking?    NoIs there a claw toe deformity?              No Is there elevated skin temperature?            No Is there limited ankle dorsiflexion?            No Is there foot or ankle muscle weakness?            No  Diabetic Foot Care Education Patient  educated on appropriate care of diabetic feet.  Pulse Check          Right Foot          Left Foot Posterior Tibial:        diminished            diminished Dorsalis Pedis:        diminished            diminished Comments: long and thick toenails on both sides High Risk Feet? Yes Set Next Diabetic Foot Exam here: 07/29/2010   10-g (5.07) Semmes-Weinstein Monofilament Test Performed by: Filomena Jungling NT II          Right Foot          Left Foot Site 1  normal         normal Site 2         normal         normal Site 3         normal         normal Site 4         normal         normal Site 5         normal         normal Site 6         normal         normal Site 7         normal         normal Site 8         normal         normal Site 9         normal         normal  Impression      normal         normal   Process Orders Check Orders Results:     [facility]: Check successful Tests Sent for requisitioning (July 30, 2009 3:02 PM):     07/30/2009: [facility] -- T-Urine Microalbumin w/creat. ratio [[id number]] (signed)     07/30/2009: [facility] -- T-Comprehensive Metabolic Panel [[id number]] (signed)     07/30/2009: [facility] -- T-CBC w/Diff [[id number]] (signed)     07/30/2009: [facility] -- T-Ferritin [phone] (signed)     07/30/2009: [facility] -- T-Lipid Profile [phone] (signed)    Laboratory Results   Blood Tests   Date/Time Received: July 29, 2009 9:45 AM  Date/Time Reported: Burke Keels  July 29, 2009 9:45 AM   HGBA1C: 6.4%   (Normal Range: Non-Diabetic - 3-6%   Control Diabetic - 6-8%) CBG Fasting:: 99mg /dL       Pneumovax Vaccine    Vaccine Type: Pneumovax (Medicare)    Site: right deltoid    Mfr: Merck    Dose: 0.5 ml    Route: IM    Given by: Chinita Pester RN    Exp. Date: 10/08/2010    Lot #: [id number]    VIS  given: 01/16/96 version given July 29, 2009.

## 2010-07-20 NOTE — Progress Notes (Signed)
----   Converted from flag ---- ---- 11/19/2009 9:00 AM, Shon Hough wrote: Contacted the caregiver and set up lab appt for 11/24/2009 and  Dr. Alfonzo Beers on 12/09/2009.  ---- 11/19/2009 8:41 AM, Chinita Pester RN wrote: Juanell Fairly, Ms. Angola needs labs and f/u appt. per Dr. Meredith Pel. Thanks ------------------------------  Appended Document:     Clinical Lists Changes  Orders: Added new Test order of T-Comprehensive Metabolic Panel [id number]) - Signed Added new Test order of T-Lipid Profile ([id number]) - Signed Added new Test order of T-CBC w/Diff [id number]) - Signed

## 2010-07-20 NOTE — Progress Notes (Signed)
Summary: refill/gg  Phone Note Refill Request  on September 29, 2009 9:39 AM  Refills Requested: Medication #1:  LISINOPRIL 10 MG TABS Take 4 tablets by mouth two times a day   Last Refilled: 07/24/2009  Method Requested: Electronic Initial call taken by: Merrie Roof RN,  September 29, 2009 9:40 AM  Follow-up for Phone Call        Refilled electronically.  Follow-up by: Margarito Liner MD,  September 30, 2009 3:30 PM    Prescriptions: LISINOPRIL 10 MG TABS (LISINOPRIL) Take 4 tablets by mouth two times a day  #240 x 5   Entered and Authorized by:   Margarito Liner MD   Signed by:   Margarito Liner MD on 09/30/2009   Method used:   Electronically to        [facility] [phone]* (retail)       [address]       [place], [place]  [address]       Ph: [phone]       Fax: [phone]   RxID:   [id number]

## 2010-07-20 NOTE — Assessment & Plan Note (Signed)
Summary: EST-CK/FU/MEDS/CFB   Vital Signs:  Patient profile:   77 year old female Weight:      195.7 pounds (88.95 kg) BMI:     29.00 Temp:     97.6 degrees F (36.44 degrees C) Pulse rate:   58 / minute BP sitting:   156 / 82  (left arm) Cuff size:   regular  Vitals Entered By: Theotis Barrio NT II (December 09, 2009 12:08 PM) CC: dm/COMPLAINS OF BILATERAL LEG PAIN AND RIGHT ARM PAIN Is Patient Diabetic? Yes Did you bring your meter with you today? No/NEED A METER Pain Assessment Patient in pain? yes     Location: B/LEGS AND R/ARM Intensity: 5 Type: STIFFNESS Onset of pain  Chronic Nutritional Status BMI of 25 - 29 = overweight  Have you ever been in a relationship where you felt threatened, hurt or afraid?No   Does patient need assistance? Functional Status Self care Ambulation Normal   Primary Care Provider:  Dr. Meredith Pel  CC:  dm/COMPLAINS OF BILATERAL LEG PAIN AND RIGHT ARM PAIN.  History of Present Illness: Patient returns for follow-up of her hypertension, hyperlipidemia, diet-controlled diabetes mellitus, , and other medical problems.  Her daughter accompanied her to clinic today.  She is doing well without acute complaints.  She reports that she is compliant with her medications.  She has not yet seen a dermatologist for management of her psoriasis, although I have recommended this more than once in the past  Preventive Screening-Counseling & Management  Alcohol-Tobacco     Smoking Status: quit  Current Medications (verified): 1)  Lisinopril 10 Mg Tabs (Lisinopril) .... Take 4 Tablets By Mouth Two Times A Day 2)  Pravachol 40 Mg Tabs (Pravastatin Sodium) .... Take 1 Pill By Mouth Daily. 3)  Aspirin 325 Mg Tabs (Aspirin) .... Take 1 Tablet By Mouth Once A Day 4)  Furosemide 40 Mg Tabs (Furosemide) .... Take 1 Tablet By Mouth Once A Day 5)  Carvedilol 12.5 Mg Tabs (Carvedilol) .... Take 1 Tablet By Mouth Two Times A Day 6)  Amlodipine Besylate 10 Mg  Tabs  (Amlodipine Besylate) .... Take 1 Tablet By Mouth Once A Day 7)  Klor-Con M20 20 Meq Cr-Tabs (Potassium Chloride Crys Cr) .... Take 2 Pills By Mouth Daily.  Allergies (verified): No Known Drug Allergies  Review of Systems General:  Denies chills, fever, loss of appetite, sleep disorder, and sweats. CV:  Denies chest pain or discomfort, difficulty breathing at night, difficulty breathing while lying down, shortness of breath with exertion, and swelling of feet. Resp:  Denies chest discomfort and shortness of breath. GI:  Denies abdominal pain, loss of appetite, and vomiting; occasional nausea. GU:  Denies dysuria. MS:  Denies muscle aches and cramps.  Physical Exam  General:  alert, no distress Lungs:  normal respiratory effort, normal breath sounds, no crackles, and no wheezes.   Heart:  normal rate, regular rhythm, no murmur, no gallop, and no rub.   Abdomen:  soft, non-tender, and normal bowel sounds.   Extremities:  no edema  Diabetes Management Exam:    Foot Exam (with socks and/or shoes not present):       Inspection:          Left foot: abnormal          Right foot: abnormal       Nails:          Left foot: thickened          Right foot: thickened  Impression & Recommendations:  Problem # 1:  DIABETES MELLITUS, TYPE II (ICD-250.00) Patient's diabetes is well controlled on diet alone.  Her updated medication list for this problem includes:    Lisinopril 10 Mg Tabs (Lisinopril) .Marland Kitchen... Take 4 tablets by mouth two times a day    Aspirin 325 Mg Tabs (Aspirin) .Marland Kitchen... Take 1 tablet by mouth once a day  Orders: T-Hgb A1C (in-house) (45809XI) T- Capillary Blood Glucose (33825)  Labs Reviewed: Creat: 0.97 (11/25/2009)     Last Eye Exam: No diabetic retinopathy.   Cataract OD.   Visual acuity OD (best corrected):     20/40 Visual acuity OS (best corrected):     20/40 -1 Intraocular pressure OD:     WNL Intraocular pressure OS:     WNL  Exam by Dr. Loraine Leriche [facility]  (08/23/2007) Reviewed HgBA1c results: 6.6 (12/09/2009)  6.4 (07/29/2009)  Problem # 2:  HYPERTENSION (ICD-401.9) Patient's blood pressure is above target range on current regimen; the plan is to increase carvedilol 12.5 mg to a dose of 1-1/2 tablets 2 times a day.  Her updated medication list for this problem includes:    Lisinopril 10 Mg Tabs (Lisinopril) .Marland Kitchen... Take 4 tablets by mouth two times a day    Furosemide 40 Mg Tabs (Furosemide) .Marland Kitchen... Take 1 tablet by mouth once a day    Carvedilol 12.5 Mg Tabs (Carvedilol) .Marland Kitchen... Take 1and 1/2  tablets by mouth two times a day    Amlodipine Besylate 10 Mg Tabs (Amlodipine besylate) .Marland Kitchen... Take 1 tablet by mouth once a day  BP today: 156/82 Prior BP: 160/91 (07/29/2009)  Prior 10 Yr Risk Heart Disease: 27 % (07/29/2009)  Labs Reviewed: K+: 3.9 (11/25/2009) Creat: : 0.97 (11/25/2009)   Chol: 168 (11/25/2009)   HDL: 50 (11/25/2009)   LDL: 105 (11/25/2009)   TG: 66 (11/25/2009)  Problem # 3:  HYPERLIPIDEMIA (ICD-272.4) Patient's LDL is slightly above target range. The plan is to increase pravastatin 40 mg to a dose of 1-1/2 tablets daily.  Her updated medication list for this problem includes:    Pravachol 40 Mg Tabs (Pravastatin sodium) .Marland Kitchen... Take 1 and 1/2 tablets by mouth once a day  Labs Reviewed: SGOT: 13 (11/25/2009)   SGPT: <8 U/L (11/25/2009)  Prior 10 Yr Risk Heart Disease: 27 % (07/29/2009)   HDL:50 (11/25/2009), 50 (03/31/2009)  LDL:105 (11/25/2009), 142 (03/31/2009)  Chol:168 (11/25/2009), 210 (03/31/2009)  Trig:66 (11/25/2009), 88 (03/31/2009)  Complete Medication List: 1)  Lisinopril 10 Mg Tabs (Lisinopril) .... Take 4 tablets by mouth two times a day 2)  Pravachol 40 Mg Tabs (Pravastatin sodium) .... Take 1 and 1/2 tablets by mouth once a day 3)  Aspirin 325 Mg Tabs (Aspirin) .... Take 1 tablet by mouth once a day 4)  Furosemide 40 Mg Tabs (Furosemide) .... Take 1 tablet by mouth once a day 5)  Carvedilol  12.5 Mg Tabs (Carvedilol) .... Take 1and 1/2  tablets by mouth two times a day 6)  Amlodipine Besylate 10 Mg Tabs (Amlodipine besylate) .... Take 1 tablet by mouth once a day 7)  Klor-con M20 20 Meq Cr-tabs (Potassium chloride crys cr) .... Take 2 pills by mouth daily.  Other Orders: T-Hemoccult Card-Multiple (take home) (05397)  Patient Instructions: 1)  Please schedule a follow-up appointment in 2 months. 2)  Please increase Pravachol 40 mg to a dose of 1 and 1/2 tablets daily. 3)  Please increase carvedilol 12.5 mg to a dose of 1  and 1/2 tablets two times a day 4)  Please schedule an appointment with your dermatologist.  Prescriptions: CARVEDILOL 12.5 MG TABS (CARVEDILOL) Take 1and 1/2  tablets by mouth two times a day  #45 x 5   Entered and Authorized by:   Margarito Liner MD   Signed by:   Margarito Liner MD on 12/11/2009   Method used:   Electronically to        [facility] [phone]* (retail)       [address]       [place], [place]  [address]       Ph: [phone]       Fax: [phone]   RxID:   [id number] PRAVACHOL 40 MG TABS (PRAVASTATIN SODIUM) Take 1 and 1/2 tablets by mouth once a day  #45 x 5   Entered and Authorized by:   Margarito Liner MD   Signed by:   Margarito Liner MD on 12/11/2009   Method used:   Electronically to        [facility] [phone]* (retail)       [address]       [place], [place]  [address]       Ph: [phone]       Fax: [phone]   RxID:   [id number]    Prevention & Chronic Care Immunizations   Influenza vaccine: Fluvax MCR  (04/14/2009)   Influenza vaccine due: 02/18/2010    Tetanus booster: Not documented    Pneumococcal vaccine: Pneumovax (Medicare)  (07/29/2009)    H. zoster vaccine: Not documented  Colorectal Screening   Hemoccult: negative x 3  (06/07/2007)   Hemoccult action/deferral: Ordered  (12/09/2009)   Hemoccult due: 06/2008    Colonoscopy: Diffuse diverticulosis. Exam by Barbette Hair. Arlyce Dice, M.D.   (08/17/2007)  Other Screening   Pap smear: Not documented   Pap smear action/deferral: Not indicated S/P hysterectomy  (07/29/2009)    Mammogram: ASSESSMENT: Negative - BI-RADS 1^MM DIGITAL SCREENING  (09/08/2008)    DXA bone density scan: Lumbar spine T score was -1.4, consistent with osteopenia.  (03/13/2009)   DXA bone density action/deferral: Ordered  (02/27/2009)  Reports requested:   Last mammogram report requested.  Smoking status: quit  (12/09/2009)    Screening comments: Per daughter, patient had mammogram this year; report requested.  Diabetes Mellitus   HgbA1C: 6.6  (12/09/2009)    Eye exam: No diabetic retinopathy.   Cataract OD.   Visual acuity OD (best corrected):     20/40 Visual acuity OS (best corrected):     20/40 -1 Intraocular pressure OD:     WNL Intraocular pressure OS:     WNL  Exam by Dr. Loraine Leriche [facility]   (08/23/2007)   Eye exam due: 08/2008    Foot exam: yes  (12/09/2009)   Foot exam action/deferral: Do today   High risk foot: Yes  (12/09/2009)   Foot care education: Done  (12/09/2009)   Foot exam due: 07/29/2010    Urine microalbumin/creatinine ratio: Not documented   Urine microalbumin action/deferral: Ordered    Diabetes flowsheet reviewed?: Yes   Progress toward A1C goal: At goal  Lipids   Total Cholesterol: 168  (11/25/2009)   Lipid panel action/deferral: Lipid Panel ordered   LDL: 105  (11/25/2009)   LDL Direct: Not documented   HDL: 50  (11/25/2009)   Triglycerides: 66  (11/25/2009)    SGOT (AST): 13  (11/25/2009)   BMP action: Ordered  SGPT (ALT): <8 U/L  (11/25/2009)   Alkaline phosphatase: 85  (11/25/2009)   Total bilirubin: 0.7  (11/25/2009)    Lipid flowsheet reviewed?: Yes   Progress toward LDL goal: Improved  Hypertension   Last Blood Pressure: 156 / 82  (12/09/2009)   Serum creatinine: 0.97  (11/25/2009)   BMP action: Ordered   Serum potassium 3.9  (11/25/2009)    Hypertension flowsheet  reviewed?: Yes   Progress toward BP goal: Unchanged  Self-Management Support :   Personal Goals (by the next clinic visit) :     Personal A1C goal: 7  (03/31/2009)     Personal blood pressure goal: 130/80  (07/29/2009)     Personal LDL goal: 70  (03/31/2009)    Patient will work on the following items until the next clinic visit to reach self-care goals:     Medications and monitoring: take my medicines every day, bring all of my medications to every visit  (12/09/2009)     Eating: drink diet soda or water instead of juice or soda, eat more vegetables, use fresh or frozen vegetables, eat baked foods instead of fried foods, limit or avoid alcohol  (12/09/2009)    Diabetes self-management support: Written self-care plan  (12/09/2009)   Diabetes care plan printed    Hypertension self-management support: Written self-care plan  (12/09/2009)   Hypertension self-care plan printed.    Lipid self-management support: Written self-care plan  (12/09/2009)   Lipid self-care plan printed.    Self-management comments: STATES SHE WALKS AROUND YARD AT TIMES.    Nursing Instructions: Provide Hemoccult cards with instructions (see order) Request report of last mammogram Diabetic foot exam today    Diabetic Foot Exam Foot Inspection  Diabetic Foot Care Education Patient educated on appropriate care of diabetic feet.  Pulse Check          Right Foot          Left Foot Posterior Tibial:        diminished            diminished Dorsalis Pedis:        diminished            diminished Comments: extensive psoriasis; thick dysmorphic toenails High Risk Feet? Yes   Laboratory Results   Blood Tests   Date/Time Received: December 09, 2009 12:59 PM Date/Time Reported: Alric Quan  December 09, 2009 12:59 PM   HGBA1C: 6.6%   (Normal Range: Non-Diabetic - 3-6%   Control Diabetic - 6-8%) CBG Fasting:: 97mg /dL

## 2010-08-02 ENCOUNTER — Other Ambulatory Visit: Payer: Self-pay | Admitting: Internal Medicine

## 2010-08-04 NOTE — Telephone Encounter (Signed)
Refilled electronically.  Please schedule a follow-up appointment within 1 month and notify patient.

## 2010-08-05 NOTE — Telephone Encounter (Signed)
Message sent to scheduling to make appointment.

## 2010-08-24 ENCOUNTER — Ambulatory Visit: Payer: Self-pay | Admitting: Internal Medicine

## 2010-09-04 ENCOUNTER — Other Ambulatory Visit: Payer: Self-pay | Admitting: Internal Medicine

## 2010-09-04 DIAGNOSIS — E7800 Pure hypercholesterolemia, unspecified: Secondary | ICD-10-CM

## 2010-09-06 NOTE — Telephone Encounter (Signed)
Pt home called and message left with caregiver.   Meds ready and she will see pt keeps April appointment

## 2010-09-06 NOTE — Telephone Encounter (Signed)
Refill approved.  Please call patient and remind her to keep her scheduled appointment in April.

## 2010-09-07 ENCOUNTER — Ambulatory Visit: Payer: Self-pay | Admitting: Internal Medicine

## 2010-09-28 ENCOUNTER — Emergency Department: Payer: Medicare Other | Admitting: Emergency Medicine

## 2010-09-29 ENCOUNTER — Ambulatory Visit: Payer: Self-pay | Admitting: Internal Medicine

## 2010-10-12 ENCOUNTER — Other Ambulatory Visit: Payer: Self-pay | Admitting: Internal Medicine

## 2010-10-15 ENCOUNTER — Other Ambulatory Visit: Payer: Self-pay | Admitting: Internal Medicine

## 2010-10-15 MED ORDER — LISINOPRIL 10 MG PO TABS: 10.0000 mg | ORAL_TABLET | Freq: Every day | ORAL | Status: DC

## 2010-11-02 NOTE — Consult Note (Signed)
NAME:  Emily [name], Emily [name]                  ACCOUNT NO.:  [id number]   MEDICAL RECORD NO.:  [id number]          PATIENT TYPE:  INP   LOCATION:  2926                         FACILITY:  [facility]   PHYSICIAN:  Gerrit Friends. Dietrich Pates, MD, FACCDATE OF BIRTH:  05-Jul-1930   DATE OF CONSULTATION:  08/15/2007  DATE OF DISCHARGE:                                 CONSULTATION   CHIEF COMPLAINT:  Epigastric pain/chest pain.   HISTORY OF PRESENT ILLNESS:  The patient is a 77 year old woman with  past medical history significant for CAD status post catheterization in  2006 with moderate CAD in which medical management was used, stenting of  the RCA in 2003, DM2, hypertension, hyperlipidemia, anemia, and remote  history of tobacco abuse, presenting to the hospital secondary to  epigastric/chest pain for five months that has been progressing in  frequency.  The patient is a poor historian secondary to dementia, but  she reports chest pain that is substernal/epigastric, dull, pressure-  like, associated with rest and exertion, but not associated with  shortness of breath, nausea and vomiting, diaphoresis.  History is very  limited in this case.   PAST MEDICAL HISTORY:  1. CAD.  The patient had a catheterization in 2006 with moderate CAD.      Medical management.  The patient also had a catheterization in 2003      with a stent of the RCA, ejection fraction noted in the      catheterization in 2006 was 45.  2. Diabetes mellitus type 2.  3. Hypertension.  4. Hyperlipidemia.  5. Psoriasis.  6. Dementia.  7. Iron deficiency anemia.  8. History of tobacco abuse.  9. Question remote MI.  10.CHF ischemic with EF of 45%.   SOCIAL HISTORY:  The patient lives in [place] by herself.  She used to work in [employer].  She is currently married but her  husband is in a nursing home.  She has a 60-pack-year history of  tobacco, but quit 20 years ago.  She used to be a heavy drinker but quit  in 1984.  She  denies illicit drug and herbal medication usage and is  very inactive.   FAMILY HISTORY:  Her mother passed away with pancreatic cancer, no CAD.  Her father passed away of COPD at the age of 72.  The patient has one  brother who is status post a pacemaker placement and one brother status  post CABG and MI in his early 84s.   REVIEW OF SYSTEMS:  Per HPI.   ALLERGIES:  No known drug allergies.   MEDICATIONS:  1. Glucophage 500 mg p.o. b.i.d.  2. Lisinopril 30 mg two pills daily.  3. Lipitor 40 mg p.o. daily.  4. Aspirin 325 mg p.o. daily.  5. KCL 40 mEq p.o. daily.  6. Atenolol 100 mg p.o. daily.  7. Hydroxyzine 10 mg p.o. q.i.d.  8. Ultram 50 mg one to two tablets p.o. q.4-6 hours p.r.n.  9. Aricept 5 mg p.o. q.h.s.   PHYSICAL EXAMINATION:  VITAL SIGNS:  Pulse 76, respiratory rate 16,  blood pressure  209/87.  O2 saturations 100% on 2 liters.  GENERAL:  No acute distress.  Diffuse psoriasis.  HEENT:  NCAT.  PERRLA.  Dry oral mucosa.  NECK:  Supple with no JVD and no carotid bruits.  CARDIOVASCULAR:  Regular rate and rhythm, S1 and S2.  No murmurs, rubs,  or gallops.  LUNGS:  Clear to auscultation bilaterally anteriorly.  SKIN:  Positive psoriasis diffusely.  ABDOMEN:  Soft and nontender.  Positive bowel sounds.  No guarding and  no rebound.  RECTAL:  The patient is grossly FOBT positive.  EXTREMITIES:  No edema, but diffuse psoriasis.  NEUROLOGY:  The patient is pleasantly confused.  Neurologic examination  seems to be otherwise grossly intact.   EKG showed no real changes.   ADMISSION LABS:  WBC 6.4, hemoglobin 12.3, hematocrit 38.3, platelets  267.  Sodium 144, potassium 3.5, chloride 107, bicarb 29, BUN 9,  creatinine 0.91, glucose 89, total bilirubin 0.8, alkaline phosphatase  100, AST 17, ALT 10, total protein 7.7, albumin 3.7, calcium 9.2.  Hemoglobin A1C 6.3.  Cardiac enzymes; CK 128, CK-MB 1.5, troponin 0.02.   ASSESSMENT:  1. Chest pain/epigastric pain.  It  seems most likely gastrointestinal      in etiology, but given history of diabetes mellitus, hypertension,      hyperlipidemia, age, and previous known coronary artery disease, we      will need to rule out ACS.  We will cycle cardiac enzymes x3 q.8      hours, put on telemetry, check TSH, check hemoglobin A1C, check      FLP, hold heparin and aspirin secondary to FOBT positive, continue      beta blocker, statin, ACE inhibitor.  We will start Norvasc 5 mg      p.o. daily and we will give morphine p.r.n.  We will also need to      check a two-dimensional echocardiogram given history of CHF with an      EF of 45%.  If cardiac enzymes are negative and no new EKG changes,      then we will get Adenosine stress test hopefully in the a.m.  If      positive enzymes or change in EKG then the patient will need      catheterization.  2. Other issues per primary team.     Rufina Falco, M.D.  Electronically Signed      Gerrit Friends. Dietrich Pates, MD, [facility]  Electronically Signed   JY/MEDQ  D:  08/15/2007  T:  08/16/2007  Job:  [id number]

## 2010-11-05 NOTE — H&P (Signed)
[place]  Patient:    Emily [name], Emily [name]                         MRN: [id number] Adm. Date:  [id number] Attending:  Cherylynn Ridges                         History and Physical  CHIEF COMPLAINT: The patient is a 77 year old woman with abdominal pain, peritonitis, and likely perforated viscus.  HISTORY OF PRESENT ILLNESS: The patient has had two weeks of abdominal pain, worsening over the past likely eight to 12 hours.  She had been worked up in December 2001 for lower abdominal pain, at which time it was found she had diverticulosis and polyps, none of which appeared to be acute in nature.  She was subsequently treated with medications including Procardia, Atenolol, Premarin, Lipitor, and Protonix; however, she had no improvement in her symptoms.  Today at approximately 4 p.m. she developed severe acute abdominal pain, mostly in the upper abdomen and radiating toward the right shoulder.  She had multiple episodes of vomiting associated with severe nausea.  She came to the emergency room and was found to have peritonitis on the ED physicians examination, and subsequent CT scan without oral or IV contrast demonstrated what appeared to be air around the duodenum extending up into the porta hepatis.  With these findings surgical consultation was obtained along with the findings of acute abdomen.  PAST MEDICAL HISTORY:  1. Hypertension, for which she takes Dyazide, Atenolol, and Procardia.  2. Non-insulin dependent diabetes, for which she takes Glucophage.  OTHER MEDICATIONS:  1. Lipitor as mentioned.  2. Tylenol.  3. Premarin.  ALLERGIES: No known drug allergies.  PAST SURGICAL HISTORY:  1. Hysterectomy through a lower midline incision.  2. Colonoscopy in December 2001.  3. Appendectomy in the past.  REVIEW OF SYSTEMS: Last bowel movement was yesterday and appeared to be normal in quality.  She has not appetite and has not eaten anything since  the episode began at approximately 4 p.m.  PHYSICAL EXAMINATION:  VITAL SIGNS: She is afebrile.  Her other vital signs are actually stable.  She is not tachycardic or hypertensive.  HEENT: Sclerae anicteric.  Normoactive.  NECK: Supple.  No bruits.  CHEST: Clear to auscultation.  ABDOMEN: Distended, with hypoactive bowel sounds.  She has rebound and guarding.  RECTAL: No gross blood, guaiac negative.  LABORATORY DATA: WBC is only 6100 but she does have a strong left shift, platelet count is normal; hematocrit 35%.  UA is normal.  Chest x-ray does not show any infiltrates or cardiomegaly.  EKG shows no acute changes.  Total bilirubin is normal, as are liver function tests.  Electrolytes are within normal limits.  IMPRESSION: Likely perforated viscus in a non-insulin dependent diabetic with hypertension, likely perforated duodenal ulcer, though could be perforated diverticulosis - at which time she would need a colostomy and resection.  The risks and benefits of the procedure and possibility of colostomy were mentioned to the patient and her family and they understand and wish to proceed.  PLAN: Give her IV antibiotics, probably Primaxin, preoperatively and then take her to the OR for perforated viscus. DD:  10/12/00 TD:  10/12/00 Job: [id number]

## 2010-11-05 NOTE — Cardiovascular Report (Signed)
NAME:  Emily [name], Emily [name]                  ACCOUNT NO.:  [id number]   MEDICAL RECORD NO.:  [id number]          PATIENT TYPE:  OIB   LOCATION:  1966                         FACILITY:  [facility]   PHYSICIAN:  Charlton Haws, M.D.     DATE OF BIRTH:  [birth year]   DATE OF PROCEDURE:  DATE OF DISCHARGE:  03/07/2005                              CARDIAC CATHETERIZATION   Coronary arteriography.   INDICATIONS:  Previous coronary artery disease with PTCA of the right  coronary artery by Dr. Elsie Lincoln. Cardiolite study suggesting a picked anterior  lateral wall defect at the apex as well as mid to basal inferior defect with  some reversibility.   The ejection fraction was estimated at 46%.   Cine catheterization done with 4-French catheters in the right femoral  artery.   Left main coronary was normal.   Left anterior descending artery was extremely tortuous particularly in the  proximal segment. There appeared to be 40-50% tubular disease in the  midvessel. The distal vessel was small. The first diagonal branch had a 70%  ostial lesion.   The patient had a medium-sized intermediate branch with 70% tubular disease.   The circumflex coronary artery was somewhat small. There was a 70-80% distal  AV groove branch lesion.   Right coronary artery was dominant. There was high shepherd's crook type  takeoff. There was 30-40% proximal disease. There appeared to be a stent in  the mid vessel which had 40% smooth diffuse in-stent restenosis. The distal  aspect of the right coronary artery had 60% multiple discreet lesions.  Posterior lateral branch was small and diffusely diseased.   The PDA had 30% mild discreet lesions.   RAO ventriculography:  RAO ventriculography was normal. EF was 60%. There  was somewhat of a spade-like ventricle.   The patient was somewhat hypertensive during the case at 170/14 for an LV  pressure of 170/60 for an aortic pressure.   IMPRESSION:  The patient has moderate coronary  artery disease. None of her  lesions seem critical. She has not had any significant chest pain in two  months, although the interpretation of her Cardiolite is somewhat difficult.  It appears to be primarily fixed defects. I think continued medical therapy  is warranted. She will follow up with Dr. Daleen Squibb in the office in a week or  two. The addition of Imdur may be worthwhile in regards to her blood  pressure control and symptoms.   The ostial diagonal lesion should remain quite stable. Of the other lesions  that she has, the  only intermediate lesion would be easy to intervene on.   I am not sure that this matches up with her Myoview defects. The patient  tolerated the procedure well.           ______________________________  Charlton Haws, M.D.     PN/MEDQ  D:  03/07/2005  T:  03/07/2005  Job:  [id number]   cc:   Thomas C. Wall, M.D.  [address]  [address]  [place]  [place] [address]

## 2010-11-05 NOTE — Discharge Summary (Signed)
NAME:  Emily [name], Emily [name]                  ACCOUNT NO.:  [id number]   MEDICAL RECORD NO.:  [id number]          PATIENT TYPE:  INP   LOCATION:  2035                         FACILITY:  [facility]   PHYSICIAN:  Manning Charity, MD     DATE OF BIRTH:  [birth year]   DATE OF ADMISSION:  08/15/2007  DATE OF DISCHARGE:  08/18/2007                               DISCHARGE SUMMARY   DISCHARGE DIAGNOSES:  1. Chest pain probably because of gastric erosion and duodenitis.  2. Gastrointestinal bleed, status post esophagogastroduodenoscopy and      colonoscopy, gastric and duodenal erosions, and diffuse      diverticulosis from ascending colon to transverse colon.  3. Hypertensive urgency.  4. Diabetes mellitus with last hemoglobin A1c of 6.2 on December 2008.  5. Hypokalemia.  6. Transient delirium.   DISCHARGE MEDICATIONS:  1. Norvasc 10 mg p.o. daily.  2. Ammonium lactate to apply topically b.i.d.  3. Lipitor 60 mg p.o. daily.  4. Aricept 5 mg p.o. daily.  5. Lasix 40 mg p.o. daily.  6. Lisinopril 30 mg p.o. b.i.d.  7. Metoprolol 25 mg p.o. b.i.d.  8. Protonix 40 mg p.o. daily.  9. Hydroxyzine 10 mg p.o. p.r.n. q. 6 for itching.  10.Glucophage 500 mg p.o. b.i.d.  11.Aspirin 325 mg p.o. daily.  12.Potassium chloride 40 mEq p.o. daily.  13.Ultram 50 mg 1 to 2 tablets q.4-6 h. p.r.n. for pain.   CONSULTATION:  1.[place], cardiology.  1. GI with Dr. Melvia Heaps.  2. PT/OT.   PROCEDURES DONE DURING THIS HOSPITALIZATION:  1. Upper GI endoscopy done on August 17, 2007, positive for erosions      of body to duodenal bulb with erythematous mucosa.  Multiple areas      of erythema in the bulb and stomach with superficial erosions      present.  Duodenal apex to duodenal second portion was normal.  2. Colonoscopy done on the same date is positive for ascending colon      to transverse colon diverticulosis with normal cecum.  There was      limited rectal bleeding probably secondary to hemorrhoids.  3. A  2D echo done on August 16, 2007, is positive for an EF of 55%.      There is left ventricular wall thickness, moderate to markedly      increased, mildly calcified aortic valve with mild aortic      regurgitation, and left atrium was mildly dilated.   DISPOSITION AND FOLLOWUP:  Emily [name] will get an appointment in  outpatient clinic.  She needs followup on certain issues:  1. She needs to be arranged for an outpatient Myoview.  2. She needs to be on Protonix for the gastritis and duodenitis.  3. Please note that there was certain change in the blood pressure      medications from the admission.  Once again, she was on lisinopril      30 b.i.d., Lasix 40 daily, atenolol 100 mg daily.  She was      discharged with Norvasc 10 mg daily, Lasix 40 mg daily,  lisinopril      30 mg b.i.d., metoprolol 25 mg b.i.d.  With all this regimen, her      blood pressure was well controlled before discharge.  Please adjust      the medications as necessary to reach the target blood pressure for      her.  4. Please get a BMET on the followup to follow the creatinine and the      potassium.   BRIEF HISTORY OF PRESENT ILLNESS:  Emily [name] is a 77 year old lady with  a past medical history of coronary artery disease, status post PTCA of  right coronary artery in 2003 with the repeat cath in 2006 with  noncritical lesions and EF of 46% and peptic ulcer disease status post  perforation review, came to the clinic with complaint of chest pain.  She had some on and off nature of central chest pain for last few months  both at rest and activity.  Since 1 day prior to admission, she had 2  episodes of worsening chest pain which is central, sharp, and pressure-  like, 8 out of 10 initially noted at rest.  There is no shortness of  breath but is present when she walks.  First episode of chest pain was  present when she was watching TV and next one when she was in bed.  There is no radiation, no nausea, vomiting,  or diaphoresis.  She has  some suprapubic abdominal pain, no dysuria or frequency.  The daughter  noticed some blood in the pant but not sure if the blood was from the  stool or the urine.  Couple of weeks earlier, the patient had one  episode of vomiting but no blood, no fever, or chills.   PHYSICAL EXAMINATION:  VITAL SIGNS:  Temperature 97, blood pressures  199/100, and pulse 69.  GENERAL:  She is not in distress.  EYES:  PERRLA and anicteric.  NECK:  JVD negative.  No mass or lymph nodes.  CHEST:  Bilaterally clear.  HEART:  First and second heart sounds normal.  Regular rate and rhythm.  No rubs, murmur, or gallops.  ABDOMINAL :  Bowel sound normal.  Soft and mild tenderness in right  upper quadrant.  No rebound.  No organomegaly.  DIGITAL RECTAL:  Red/black tinged to brown stool with positive Hemoccult  stool.   LABS AT ADMISSION:  WBC 6.4, hemoglobin 12.3, MCV 82.3, RDW 15.5,  platelets 267,000, sodium 144, potassium 3.5, chloride 107, bicarbonate  29, BUN 9, creatinine 0.91, glucose 89, bilirubin 0.8, alkaline  phosphatase 100, AST 17, ALT 10, protein 7.7, albumin 13.7, calcium 9.2  with hemoglobin A1c 6.3, CK 128, CK-MB 1.5, relative index 1.2, troponin  0.02.  EKG normal sinus rhythm with rate of 65 beats per minute.  There  is T inversion in leads V4, V5, V6 which is unchanged from the last EKG  done on May 2007.   HOSPITAL COURSE:  1. Chest pain.  She was basically admitted to the  step-down unit.      She was given first dose 325 mg of aspirin from the ER.  Cardiac      enzymes were followed as well as EKG, a repeat EKG was unchanged.      Cardiac enzymes x3 were within normal limits and Emily [name] did not      have any chest pain immediately after admission.  She was also      given p.r.n. morphine for pain.  She was also given beta blocker      including metoprolol.  We continued home medications including      lisinopril and Lasix.  Norvasc was started per  cardiac      recommendation.  [facility] was consulted and the aspirin      was discontinued because of the concern for the GI bleed for      positive Hemoccult stool.  The patient was basically treated      conservatively.  A 2D echo was obtained which was showing an EF of      55% with some increased left ventricular wall thickness.  The      patient is discharged home with plan for outpatient Myoview      arranged from the outpatient clinic.  2. GI bleed.  A GI consult was done and the patient had an EGD and      colonoscopy on February 27th.  The finding of the endoscopy is      mentioned in the procedures.  The patient was basically treated      with two large bore IV Protonix b.i.d. and n.p.o.  Hemoccult x1 was      negative and she is basically stable.  Her hemoglobin was done 8      hourly and it was basically stable.  She is discharged home on      Protonix once daily.  3. Hypertensive urgency.  Her blood pressure was gradually decreased      with the addition of Norvasc which was initially started with 5 mg      p.o. daily but later it was increased to 10 mg p.o. daily and on      the day of discharge her blood pressure was stable at 116/93.  4. Abdominal pain.  This is presumed to be because of duodenitis and      the gastritis and was resolved with treatment for GI bleed.  5. Diabetes.  She was basically started on sliding scale, and she had      a hemoglobin A1c done during this admission from the clinic which      was normal at 6.3.  6. Hypokalemia secondary to Lasix.  She had lowest potassium of 3.2.      It was repleted.  A magnesium level was obtained which was normal      at 2.4.  7. Mild delirium.  Emily [name] was confused transiently and was      somewhat combative as well.  She was put on restraint transiently      and the delirium was resolved spontaneously.   DISCHARGE VITALS:  Temperature 98.3, pulse 60, respiration 20, blood  pressure 116/93, saturating  96 on room air.   DISCHARGE LABS:  Sodium 139, potassium 3.8, chloride 104, bicarbonate  27, glucose 106, BUN 15, creatinine 1.12, calcium 8.9, WBC 5.5,  hemoglobin 11.8, platelets 264,000, RDW 16.2, MCV 82.2.   CONDITION ON DISCHARGE:  No chest pain and no abdominal pain.  The  patient is alert and oriented.      Jason Coop, MD  Electronically Signed      Manning Charity, MD  Electronically Signed    YP/MEDQ  D:  08/21/2007  T:  08/22/2007  Job:  [id number]   cc:   Rufina Falco, M.D.

## 2010-11-05 NOTE — Procedures (Signed)
[place]. [facility]  Patient:    Emily [name], Emily [name]                         MRN: [id number] Proc. Date: 06/01/00 Adm. Date:  [id number] Attending:  Orland Mustard CC:         Dr. Beverley Fiedler, [facility]   Procedure Report  DATE OF BIRTH:  Mar 22, 1931.  PROCEDURE:  Colonoscopy and polypectomy with coagulation of polyp.  MEDICATIONS:  Fentanyl 75 mcg, Versed 6 mg IV.  INDICATION:  A 77 year old woman with heme-positive stool.  DESCRIPTION OF PROCEDURE:  The procedure had been explained to the patient and consent obtained.  Patient in left lateral decubitus position.  The pediatric Olympus scope was inserted and advanced under direct visualization.  The prep was excellent, and we were able to advance to the cecum without difficulty. The ileocecal valve and appendiceal orifice were seen.  The scope was withdrawn to the mid-ascending colon, and a 3-4 mm polyp was seen that was sessile, was cauterized with the hot biopsy forceps.  No polyps were seen in the remainder of the colon until the descending colon was reached, and approximately 15-20 cm apart were two 0.75 cm polyps.  Both were removed and sucked through the scope.  No other polyps were seen.  The scope was withdrawn, and the patient tolerated the procedure well.  ASSESSMENT:  Multiple colon polyps with the larger polyps in the left colon and somewhat smaller polyp in the right colon.  PLAN:  Will check pathology, and will recommend repeating procedure in two years.  Routine postpolypectomy instructions. DD:  06/01/00 TD:  06/01/00 Job: [id number]

## 2010-11-05 NOTE — Op Note (Signed)
[place]. [facility]  Patient:    [name], Emily B                         MRN: [id number] Proc. Date: 10/12/00 Adm. Date:  [id number] Attending:  Cherylynn Ridges                           Operative Report  PREOPERATIVE DIAGNOSIS:  Perforated viscus with likely perforated duodenal ulcer.  POSTOPERATIVE DIAGNOSIS:  Perforated pyloric channel ulcer 1.5 cm in size.  OPERATION PERFORMED:  Oversew and Graham patch of a perforated pyloric channel ulcer.  SURGEON:  Jimmye Norman, M.D.  ASSISTANT:  None.  ANESTHESIA:  General endotracheal.  ESTIMATED BLOOD LOSS:  Less than 100 cc.  COMPLICATIONS:  None.  CONDITION:  Stable.  FINDINGS:  1.5 cm pyloric channel ulcer without posterior involvement.  SPECIMENS:  None.  INDICATIONS FOR PROCEDURE:  The patient is a 77 year old female with abdominal pain since 4 oclock this evening, severe in nature, ____________ peritonitis who has evidence of perforated viscus on CT scan.  DESCRIPTION OF PROCEDURE:  The patient was taken to the operating room and placed on the table in supine position.  After an adequate general anesthetic was administered, she was prepped and draped in the usual sterile manner exposing the midline of the abdomen.  A #10 blade was used to make a midline incision approximately 15 cm long.  It was taken down to and through the midline fascia using electrocautery.  We entered the peritoneal cavity bluntly with blunt finger dissection and there was bilious cloudy return.  We placed appropriate retractors into the peritoneal cavity including Richardson, isolated the drainage from the right upper quadrant.  After aspirating fluid throughout all four quadrants, including the pericolic gutters in the subdiaphragmatic areas and also down from the pelvis, we were able to isolate the ulcer.  It consisted of a 1.5 cm pyloric channel ulcer. Right at the pylorus.  Five oversewing stitches were placed in the  ulcer with 2-0 silk.  Then subsequently an omental patch was overlapped into the previously tied serosal approximating stitches.  Additional 2-0 silks were placed in order to buttress the patch and once this was done, we irrigated with 3L of warm saline solution and then subsequently closed the abdomen.  The fascia was reapproximated using a running #1 PDS suture.  The skin was irrigated with saline and then the skin was closed using stainless steel staples. Sponge, needle and instrument counts were correct at the conclusion of the case. DD:  10/12/00 TD:  10/12/00 Job: [id number]

## 2010-11-05 NOTE — Discharge Summary (Signed)
[place]. [facility]  Patient:    Emily [name], Emily [name] Visit Number: [id number] MRN: [id number]          Service Type: MED Location: [phone] Attending Physician:  Cherylynn Ridges Admit Date:  10/11/2000 Discharge Date: 10/19/2000                             Discharge Summary  DISCHARGE DIAGNOSIS:  Perforated pyloric channel ulcer.  PROCEDURES:  Oversew and ______ patch of pyloric channel ulcer.  Surgeon: Dr. Lindie Spruce.  DISCHARGE MEDICATIONS:  Vicodin and Pepcid, to be taken as directed.  DIET:  Regular.  No restrictions.  CONDITION ON DISCHARGE:  Stable.  FOLLOW-UP:  To see me on Oct 24, 2000, for staple removal and wound check.  ACTIVITY:  She is limited to no heavy lifting over 20 pounds for the next month, but she can ambulate as tolerated.  WOUND CARE:  She can shower and pat the wound dry up until the staples are removed and continue to do so afterwards.  HISTORY OF PRESENT ILLNESS AND HOSPITAL COURSE:  The patient is a 77 year old woman who came in with diffuse abdominal pain.  A CT scan demonstrated air around the duodenum indicative of possible perforated duodenal ulcer.  She was taken to surgery early on the morning of October 12, 2000, at which time she had an oversew of an ulcer with a ______ patch.  The procedure went very well, and she was given perioperative antibiotics.  Her wound looked well, with no evidence of infection, though some bilious ______ inside; however, this did not lead to an overwhelming infection.  She had complaints of heel pain in both lower extremities and also left hip pain during this hospitalization, likely secondary to osteoarthritis.  There was no acute ankle, toe, or hip swelling indicative of gout.  However, she will be followed up as an outpatient by her primary care physicians at [facility].  She is to follow up to see me on Oct 24, 2000, for staple removal.Attending Physician:  Cherylynn Ridges DD:   10/19/00 TD:  10/20/00 Job: [id number]

## 2010-11-05 NOTE — H&P (Signed)
NAME:  Emily [name], Emily [name]                  ACCOUNT NO.:  [id number]   MEDICAL RECORD NO.:  [id number]          PATIENT TYPE:  INP   LOCATION:  NA                           FACILITY:  [facility]   PHYSICIAN:  Madlyn Frankel. Charlann Boxer, M.D.  DATE OF BIRTH:  09-May-1931   DATE OF ADMISSION:  DATE OF DISCHARGE:                                HISTORY & PHYSICAL   DATE OF ADMISSION TO [facility]:  November 24, 2005   CHIEF COMPLAINT:  Pain in my right hip.   PRESENT ILLNESS:  This 77 year old lady seen by Dr. Charlann Boxer for continuing and  progressive problems concerning pain into her right hip.  She has had to go  to the emergency room earlier this year for severe right hip pain and groin  pain.  She has been followed by [facility] and referred to  [place] for further evaluation.  She is extremely uncomfortable with this hip,  having more and more difficulty getting around, and she is a relatively  young lady who now has to use a walker for ambulation.  She has limited her  driving due to this pain since it is the right lower extremity.  She has  difficulty with break and accelerator.  After much discussion including the  risks and benefits of surgery, we have decided to ahead with total hip  replacement arthroplasty of the right hip.   PAST MEDICAL HISTORY:  She has no medical allergies.   Current medications are:  1.  Amlodipine 10 mg one daily.  2.  Lisinopril 30 mg b.i.d.  3.  Atenolol 100 mg one daily.  4.  Hydrochlorothiazide 25 mg one daily.  5.  Metformin 500 mg b.i.d.  6.  Furosemide 40 mg one daily.  7.  Lipitor 40 mg daily.  8.  Hydrocodone for discomfort.   The patient has headaches in the past, hypertension, history of a myocardial  infarction back in 1992 mild.  She has non-insulin-dependent diabetes  mellitus.   PAST SURGERIES:  Include diskectomy some 30 years ago in lumbar spine,  hysterectomy in 1960, herniorrhaphy in the 1960s.   FAMILY HISTORY:  Positive for  pancreatic cancer in her mother.   SOCIAL HISTORY:  The patient married, retired, has no intake of alcohol or  tobacco products.  Her children will be caregivers after surgery.   REVIEW OF SYSTEMS:  CNS:  No seizure disorder, paralysis, numbness, double  vision.  RESPIRATORY:  No productive cough, no hemoptysis or shortness of  breath.  CARDIOVASCULAR:  No chest pain, no angina, no orthopnea.  GASTROINTESTINAL:  No nausea, vomiting, melena, or bloody stools.  GENITOURINARY:  No discharge, dysuria, hematuria.  MUSCULOSKELETAL:  Primarily in present illness.   PHYSICAL EXAMINATION:  GENERAL:  Alert and cooperative, friendly 77 year old  black female who is accompanied by her daughter.  She is walking using a  walker for ambulation.  VITAL SIGNS:  Blood pressure 160/78, pulse 80, respirations 12.  HEENT:  Normocephalic, PERRLA, EOM intact, Oropharynx is clear.  CHEST:  Clear to auscultation.  No rhonchi, no rales.  HEART:  With a regular rate and rhythm.  No murmurs are heard.  ABDOMEN:  Soft, nontender.  Liver and spleen not felt.  GENITALIA, RECTAL:  Not done, not pertinent to present illness.  EXTREMITIES:  Painful range of motion of the right hip.  Neurovascular is  intact to the right lower extremity.  Dr. Charlann Boxer was in during this  examination.  The patient has psoriasis and should be no problem with her  right hip incision.   ADMITTING DIAGNOSES:  1.  Hypertension.  2.  Non-insulin-dependent diabetes mellitus.  3.  Remote history of myocardial infarction.  4.  Psoriasis.      Dooley L. Cherlynn June.      Madlyn Frankel Charlann Boxer, M.D.  Electronically Signed    DLU/MEDQ  D:  11/21/2005  T:  11/21/2005  Job:  [id number]

## 2010-11-05 NOTE — Op Note (Signed)
NAME:  Emily [name], [place]                  ACCOUNT NO.:  [id number]   MEDICAL RECORD NO.:  [id number]          PATIENT TYPE:  INP   LOCATION:  1509                         FACILITY:  [facility]   PHYSICIAN:  Madlyn Frankel. Charlann Boxer, M.D.  DATE OF BIRTH:  Aug 23, 1930   DATE OF PROCEDURE:  11/24/2005  DATE OF DISCHARGE:                                 OPERATIVE REPORT   PREOPERATIVE DIAGNOSIS:  Right hip osteoarthritis.   POSTOPERATIVE DIAGNOSIS:  Right hip osteoarthritis.   PROCEDURE:  Right total hip replacement.   COMPONENTS USED:  DePuy hip system 54 pinnacle cup, 36 metal-on-metal liner,  two cancellous bone screws, hole eliminator, Trialock 12.5 lateralized  offset stem with a 36 plus 5 ball.   SURGEON:  Madlyn Frankel. Charlann Boxer, M.D.   ASSISTANTDruscilla Brownie. Underwood, P.A.-C.   ANESTHESIA:  General.   BLOOD LOSS:  300 mL.   DRAINS:  One.   COMPLICATIONS:  None.   INDICATIONS FOR PROCEDURE:  [name] is a 77 year old female who presented  to the office with long standing right hip pain.  This significantly  diminished her quality of life and upon review of the risks and benefits  including her medical comorbidities, infection, DVT, dislocation, need for  revision, postoperative pain, she consented for right total hip placement as  she chose not to proceed with any further nonoperative interventions in the  form of pain injection and medications.   PROCEDURE IN DETAIL:  The patient was brought to operative theater.  Once  adequate anesthesia and 2 grams of Ancef were administered, the patient was  positioned in the left lateral decubitus position with the right side up.  The right lower extremity prepped and draped in a sterile fashion.  A  lateral based incision was made for a posterior approach to the hip.  The  iliotibial band and gluteal fascia was incised in line with the incision.  The short external rotators were taken down and separated from the posterior  capsule which was saved for  protection against the sciatic nerve with  retractors and then later repair anatomically.  The hip was dislocated and  neck osteotomy was made based off anatomic landmarks and preoperative  templating based relative to the tip of the trochanter.  Following neck  osteotomy, attention was first directed to the femur.  Femoral exposure was  obtained and the canal was entered and irrigated to prevent fat emboli.  Broaching commenced and was carried up to a 12.5 broach with excellent bony  purchase.   At this point, attention was directed to the acetabulum.  Acetabular  exposure was obtained including labrectomy.  Reaming commenced with a 45  reamer and I carried up to a 51 reamer.  At this point, there was not as  much subchondral bone removal and for that reason I chose to go up.  The  posterior wall was somewhat thin at this point, however, reaming was carried  up into the pelvis to get good bony contact.  I was satisfied with the 53  reaming.  I had to curet out some  cysts and packed these with bone graft  from the femoral head.  The final 54 pinnacle cup was impacted with 35  degrees of abduction and 20 degrees of forward flexion beneath the anterior  rim anteriorly.  Two cancellous bone screws were placed with excellent  purchase.   The trial liner was positioned and attention was redirected back to the  femur.  A 12.5 stem broach was then place with about 25 degrees of  anteversion compared to the leg in a perpendicular position.  A lateralized  offset neck was positioned followed by trial reduction, first with a 36  +1.5.  The hip was very stable and the combined anteversion was 45-50  degrees, however, there was about 5 mm shuck.  For this reason, I went with  a 36 plus 5 ball trial. With this in place, the shuck was significantly less  in extension and the hip stability remained and I did not feel that she was  lengthened.  Given this, the final hole eliminator was positioned on  the  acetabular component followed by positioning of the 36 metal neutral liner.  The final 12.5 lateralized offset femoral stem was then impacted to the  level where the broach was.   I did re-trial 1.5 but then decided with the +5 ball the hip stability was  excellent, stable in extension, external rotation, flexion, internal  rotation as well as in the sleep position with adduction internal rotation.  The leg lengths appeared to be equal, the shuck was as noted, 1-2 mm.  Given  this, the hip was copiously irrigated with normal saline solution.  I  reapproximated the posterior capsule to the superior leaflet anatomically.  I then placed a Hemovac drain deep.  The iliotibial band was reapproximated  using #1 Ethilon, #1 Vicryl was utilized for the gluteal fascia.  The  remainder of the wound was closed in layers with a running 4-0 Monocryl.  The hip was cleaned, dried, and dressed sterilely with Steri-Strips,  dressing sponges, and tape.  The patient was then extubated and brought to  the recovery room in stable condition.      Madlyn Frankel Charlann Boxer, M.D.  Electronically Signed     MDO/MEDQ  D:  11/24/2005  T:  11/24/2005  Job:  [id number]

## 2010-11-05 NOTE — Cardiovascular Report (Signed)
NAME:  Emily [name], Emily [name]                            ACCOUNT NO.:  [id number]   MEDICAL RECORD NO.:  [id number]                   PATIENT TYPE:  OIB   LOCATION:  6532                                 FACILITY:  [facility]   PHYSICIAN:  Madaline Savage, M.D.             DATE OF BIRTH:  1931-03-30   DATE OF PROCEDURE:  03/22/2002  DATE OF DISCHARGE:  03/23/2002                              CARDIAC CATHETERIZATION   PROCEDURES PERFORMED:  1. Selective coronary angiography by Judkins technique.  2. Retrograde left heart catheterization.  3. Left ventricular angiography.  4. Abdominal aortography.  5. Percutaneous coronary angioplasty of the mid right coronary artery,     successful.  6. Attempted intracoronary artery stenting of the mid right coronary artery,     unsuccessful due to failure to cross lesion.   COMPLICATIONS:  None.   ENTRY SITE:  Right femoral.   DYE USED:  Omnipaque.   MEDICATIONS GIVEN:  1. Phentanyl IV.  2. Nubain IV.  3. Intravenous nitroglycerin.  4. Integrilin.  5. Heparin.   PATIENT PROFILE:  The patient is a 77 year old African-American female who  has known coronary disease, having had an angioplasty many years ago, and  her last catheterization was in 2/97 by Dr. [place].  She was referred to [place]  by Dr. Darnelle Catalan at the [facility] for chest pain  evaluation.  Her Cardiolite stress test showed an ejection fraction of 69%.  There was mild anterolateral wall ischemia.  The patient presents today for  outpatient cardiac catheterization.   RESULTS/PRESSURES:  The left ventricular pressure was 175/33, end diastolic  pressure of 45, central aortic pressure of 175/82, mean of 125.  No aortic  valve gradient by pull back technique.   ANGIOGRAPHIC RESULTS:  The left main coronary artery was calcified.  It was  approximately 20% stenosed.  It was a large vessel.   The left anterior descending coursed the cardiac apex and gave rise to a  bifurcating diagonal branch arising in the proximal portion of the vessel.  The left anterior descending contained a lesion of 50% just after the  diagonal branches.  It was a diffusely irregular vessel, however.   The circumflex was non-dominant.  It was comprised of a twin pair of  circumflex and obtuse marginal branch #1.  Both vessels contained 50%  stenosis in the mid portion of the vessel, and had other diffuse disease  noted.   The right coronary artery had been previously angioplastied in the upper mid  segment of the vessel some years ago.  Today, she was noted to have an 80%  lesion in the mid portion of the vessel in the lower end of the mid right  coronary artery.  There was 80%.   More distally, there was a 70% stenosis in the posterior descending and the  posterior lateral branches were rather small and diffusely diseased.  Left ventricular ejection fraction was felt to be 55%, and there was  inferobasal hypokinesis of a moderate degree.  There was no mitral  regurgitation.   The percutaneous intervention was long and complicated and difficult.   The guide catheter ultimately used to successfully complete this procedure  was a left Amplatz #2 guide catheter which gave the best backup support of  any guide used.  Several other guides were used prior to this being the best  one for the procedure.   The guide wire used was a Radiographer, therapeutic.  The pre-dilatation balloon was a  3.0 x 12 mm Maverick balloon.  The second balloon used was a 3.5 x 15 mm  Maverick balloon.  The stent that was unsuccessful at crossing the lesion  was a 3.5 x 15 mm Express 2 stent.   This intervention was difficult in many ways.  The guide catheters used  prior to the Amplatz #2 all provided very poor backup support.  The Amplatz  #2 was every bit long enough to intubate the ostium of the right coronary  artery ostium, but due to the tortuosity of the vessel, this guide tended to  back out even  in the best of circumstances.  The guide wire crossing the  vessel was difficult due to technical factors related to diffuse disease and  tortuosity.  The initial Maverick balloon was placed across the vessel with  difficulty, and was inflated on two occasions with unsatisfactory results,  so a 3.5 Maverick balloon was inflated multiple times and gave a very nice  result with an 80%  stenosis initially and a 20% stenosis following  dilatation.  I wanted to try to stent the vessel because of the tortuosity  of vessel, the fact that this stenosis occurred on a S-shaped portion of the  vessel, and because it did show some elastic recoil angiographically.  All  attempts at trying to cross with the stent lead to the guide wire migrating  back out of the lesion, and the guide catheter actually coming completely  out and pulling the whole system out on at least two occasions.  Due to the  length of the procedure, the patient's hip pain and my strong suspicion that  we would never get this or any stent to cross the lesion lead me to stop the  procedure with the balloon angioplasty result.   FINAL DIAGNOSES:  1. Multi-vessel coronary disease, diffuse and mild in all but the right     coronary artery.  That vessel contained an 80% stenosis in the lower     portion of the mid right coronary artery.  2. Successful percutaneous transluminal coronary angioplasty of the mid     right coronary artery with reduction of an 80% lesion to 20%,     unsuccessful crossing with stent.  3. Good left ventricular systolic function, ejection fraction 55% with     inferobasal wall hypokinesis.    RECOMMENDATIONS:  The patient will be treated with aspirin and a platelet  inhibitor.  She was entered into the Jumbo trial, and will get either Plavix  or an unidentified study drug for 30 days.                                                 Madaline Savage, M.D.   WHG/MEDQ  D:  03/22/2002  T:  03/26/2002  Job:   [id number]   cc:   Hillery Aldo, M.D.  Int. Med. - Resident - [address]  [place], [place] [address]  Fax: [phone]   [facility]

## 2010-11-05 NOTE — Discharge Summary (Signed)
NAME:  Emily [name], Emily [name]                  ACCOUNT NO.:  [id number]   MEDICAL RECORD NO.:  [id number]          PATIENT TYPE:  INP   LOCATION:  1509                         FACILITY:  [facility]   PHYSICIAN:  Emily Frankel. [name], M.D.  DATE OF BIRTH:  19-Mar-1931   DATE OF ADMISSION:  11/24/2005  DATE OF DISCHARGE:  11/27/2005                                 DISCHARGE SUMMARY   ADMITTING DIAGNOSES:  1.  Severe osteoarthritis of the right hip.  2.  Hypertension.  3.  Non-insulin-dependent diabetes mellitus.  4.  Remote history of myocardial infarction.  5.  Psoriasis.   DISCHARGE DIAGNOSES:  1.  Severe osteoarthritis of the right hip.  2.  Hypertension.  3.  Non-insulin-dependent diabetes mellitus.  4.  Remote history of myocardial infarction.  5.  Psoriasis.  6.  Mild postoperative anemia.   OPERATION:  On [name] 07, 2007, the patient underwent right total hip  replacement arthroplasty.  [facility] PA-C assisted.   BRIEF HISTORY:  This 77 year old lady with progressive problems concerning  pain into the right hip.  She has had an intermittent hip pain and some so  severe when she had to the emergency room for pain and discomfort.  She is  referred [place] through the courtesy of [facility].  X-rays  have shown a marked deterioration of the and joint space of the right hip.  She is an active lady, has had more and more difficulty in driving and  getting herself about and overall her overall quality of life has  deteriorated.  After much discussion including the risks and benefits of  surgery, it was  decided she would benefit with the above procedure.   COURSE IN THE HOSPITAL:  The patient tolerated surgical procedure quite  well, began working with physical therapy quite diligently.  The wound  remained clean and dry postoperatively, neurovascular remained intact in the  operative extremity.  She was able to maintain limited weightbearing and  doing quite well.  Once home  arrangements were made as well as home  equipment, it was decided she could be maintained in her home environment  and arrangements were made for discharge.  She did have a hypokalemia  postoperatively and we gave her a potassium supplement.   LABORATORY VALUES IN THE HOSPITAL:  Hematologically, showed a CBC  preoperatively completely within normal limits.  Hemoglobin 12.6, hematocrit  was 38.3.  Final hemoglobin was 10.1, hematocrit 30.6.  Potassium  postoperatively dropped to 3.1 and then to 3.  Urinalysis essentially  negative for urinary tract infection.   Electrocardiogram showed normal sinus rhythm with possible left atrial  enlargement.  Electrocardiogram showed cardiomegaly with no congestive heart  failure nor active disease.   CONDITION ON DISCHARGE:  Improved, stable.   PLAN:  The patient discharged to her home.  Use Vicodin for discomfort,  Robaxin 5 mg for muscle spasms, K-Dur 40 mEq daily, (will follow-up with her  [facility] primary care), Coumadin protocol for 4 weeks after date of  surgery.  Return to see Dr. [name] 2  weeks after date of surgery.      Emily [name].      Emily [name], M.D.  Electronically Signed    DLU/MEDQ  D:  12/14/2005  T:  12/14/2005  Job:  [id number]

## 2010-11-05 NOTE — Discharge Summary (Signed)
NAME:  Emily [name], Emily [name]                            ACCOUNT NO.:  [id number]   MEDICAL RECORD NO.:  [id number]                   PATIENT TYPE:  OIB   LOCATION:  6532                                 FACILITY:  [facility]   PHYSICIAN:  Madaline Savage, M.D.             DATE OF BIRTH:  25-Dec-1930   DATE OF ADMISSION:  03/22/2002  DATE OF DISCHARGE:  03/23/2002                                 DISCHARGE SUMMARY   [facility] MEDICAL RECORD [phone]:   ADMISSION DIAGNOSES:  1. Recurrent angina.  2. Coronary artery disease.  3. Abnormal Cardiolite.  4. Hypertension.  5. Non-insulin-dependent diabetes mellitus type 2.  6. Psoriasis.   DISCHARGE DIAGNOSES:  1. Angina, status post cardiac catheterization by Dr. Elsie [name], with     percutaneous transluminal coronary angioplasty of the right coronary     artery.  Jumbo study.  2. Coronary artery disease.  3. Abnormal Cardiolite.  4. Hypertension.  5. Non-insulin-dependent diabetes mellitus type 2.  6. Psoriasis.   HISTORY OF PRESENT ILLNESS:  The patient is a 77 year old patient of Dr.  Elsie [name] who has known coronary artery disease.  She was recently referred to  Dr. Elsie [name] for evaluation of chest pain.  She underwent Persantine  Cardiolite stress testing which revealed mild anterolateral ischemia with  inferolateral thinning with an EF of 59% on February 05, 2002.  As a result,  plans were made for cardiac catheterization at [facility].   PROCEDURE:  Cardiac catheterization March 22, 2002, by Dr. Chanda Busing  with intervention to the RCA.   COMPLICATIONS:  None.   CONSULTATIONS:  Psychologist, forensic.   HOSPITAL COURSE:  The patient was admitted to [facility] on March 22, 2002, for elective cardiac catheterization after having an abnormal  Cardiolite and recurrent chest pain.   Preprocedure laboratories:  WBC 3.5, hemoglobin 10.4, platelets 197.  INR  1.2.  Cardiac enzymes  negative x 1.  BMET on  March 18, 2002, showed a  BUN of 17, creatinine 0.7, potassium 4.2.   The patient was taken to the cardiac catheterization lab by Dr. Chanda Busing on March 22, 2002.  This revealed a 20% ostial left main, LAD with a  50% stenosis up to the diagonal 1 takeoff, circumflex with a 50% mid lesion.  An OM with a 50% mid lesion, and an RCA with diffuse 50% proximal narrowing,  80% mid narrowing, and 70% distal narrowing before the takeoff of the PDA  and PLA.  These two vessels also had diffuse disease.  EF 55%.  Dr. Elsie [name]  proceeded with PTCA of the mid RCA, reducing the lesion from 80 to 20%.  There was an unsuccessful attempt to cross the lesion with a stent because  the vessel was tortuous.  The guide catheter was not functioning properly.  The patient was treated with Integrilin infusion and enrolled in the  Jumbo  study through [employer].   On March 23, 2002, the patient remained stable.  Vital signs were stable  and groin stable.   Laboratories on March 23, 2002, showed a potassium of 3.4, BUN 12,  creatinine 1.0.  Potassium will be repleted before discharge.  CK 122.   The patient will be discharged to home today.  She will follow up with Dr.  Elsie [name] in two weeks.   DISCHARGE MEDICATIONS:  1. JUMBO study drug x 1 month.  2. Coated aspirin 325 a day.  3. Metoprolol 50 mg [name].i.d.  4. Hyzaar 50/12.5 2 a day.  5. Lipitor 20 mg.  6. Nifedipine ER 90 mg.  7. Metformin 500 mg [name].i.d. to restart on Sunday, March 24, 2002.  8. Nitroglycerin as needed for chest pain.   DISCHARGE INSTRUCTIONS:  1. No strenuous activity, lifting more than five pounds, or driving for two     days.  2. Low-fat, low-cholesterol, low-salt diet.  3. May shower.   FOLLOW-UP:  She is asked to call Dr. Truett Perna office on Monday for a two-  week follow-up appointment.      Georgiann Cocker Jernejcic, P.A.                   Madaline Savage, M.D.    TCJ/MEDQ  D:  03/23/2002  T:  03/26/2002  Job:   [id number]

## 2010-11-05 NOTE — Discharge Summary (Signed)
NAME:  Emily [name], Emily [name]                  ACCOUNT NO.:  [id number]   MEDICAL RECORD NO.:  [id number]          PATIENT TYPE:  INP   LOCATION:  5706                         FACILITY:  [facility]   PHYSICIAN:  Duncan Dull, M.D.     DATE OF BIRTH:  29-Apr-1931   DATE OF ADMISSION:  04/07/2006  DATE OF DISCHARGE:  04/13/2006                               DISCHARGE SUMMARY   DISCHARGE DIAGNOSES:  1. Bilateral foot pain secondary to gout, versus pseudogout, versus      psoriatic arthritis.  2. Severe psoriasis.  3. Sundowning.  4. Hypertension.  5. Iron deficiency anemia.  6. Diet-controlled diabetes type 2.  7. Moderate coronary artery disease status post angioplasty to the      right coronary artery in 2003 and an ejection fraction 46% on      catheterization in 2006.  8. Congestive heart failure with an ejection fraction of 45%.  9. Progressive dementia.   DISCHARGE MEDICATIONS:  1. Lisinopril 60 mg by mouth once a day.  2. Prednisone taper pack 60 mg once on Friday, then 50 mg once on      Saturday, then 40 mg once on Sunday, then 30 mg once on Monday,      then 20 mg once on Tuesday, then 10 mg once on Wednesday, and then      stop.  3. Furosemide 40 mg once a day by mouth.  4. Potassium chloride 40 mEq by mouth once a day.  5. Aspirin 325 mg by mouth once a day.  6. Lipitor 40 mg by mouth once a day.  7. Atenolol 100 mg by mouth once a day.  8. Glucophage 500 mg by mouth twice a day.  9. Lac-Hydrin topical cream to apply to areas of skin affected by      psoriasis twice a day as needed.   DISPOSITION:  Improved with some resolution of her bilateral foot pain  and considerable resolution of her ankle swelling.  She was ambulating  well with only mild pain that she said she could tolerate and was much  improved from when she had previously come in.  Concerning her other  issues, they were all either back at baseline or improved, especially  her psoriasis, which was considerably improved  following the  administration of the Lac-Hydrin.   FOLLOWUP APPOINTMENTS:  She has a dermatologist appointment on April 18, 2006, which is apparently confirmed, but they did not recall the  name of that dermatologist.  She will have an MRI of her head on April 21, 2006 at 11 o'clock in  the morning.  She is to return to see Dr. Noel Gerold on May 03, 2006 at 3 o'clock p.m.    Marland Kitchen  Follow up of her dermatology appointment considering her psoriasis  should be done.  It is possible that she may have been on methotrexate  in the past.  We considered doing it in house, however, we were  concerned about the potential side effects.  We are interested to see  what her dermatologist has advised.  We should  also call her pharmacy  again to double check on which medications she is taking and to see if  she has had home health to insure that she is actually taking these  medications.   PROCEDURES PERFORMED:  An aspiration of the left ankle.  This was done  by Dr. Erasmo Leventhal on April 08, 2006.   CONSULTATIONS:  1. Dr. Erasmo Leventhal on April 08, 2006.  2. Dr. Phylliss Bob of rheumatology on April 10, 2006.   BRIEF ADMITTING HISTORY AND PHYSICAL:  Emily [name] is a 77 year old  female with a history of ischemic cardiomyopathy with reduced LV  function and untreated psoriasis who presented to the emergency  department with bilateral foot swelling, which began 2 to 3 weeks PTA.  The swelling was accompanied by pain which was not releived by ASA and  Tylenol, or Epsom soaks.  She denied shortness of breath, orthopnea,  dyspnea on exertion, or numbness.  According to the clinic, she was  taking Klor-Con 40 mEq once daily, though she did not remember.  Also,  additional medications, she should have been taking furosemide 40 mg  once every day, which she did not remember.  She used to be on  hydrochlorothiazide, but that was stopped.   PHYSICAL EXAMINATION:  GENERAL:  She is  in no apparent distress.  VITAL SIGNS:  Temperature 97.5, blood pressure 157/84, pulse 89,  respiratory rate 18, oxygen saturation 97% on room air.  NECK:  She has no thyromegaly or JVD.  LUNGS:  Clear to auscultation bilaterally.  No rhonchi or crackles.  CARDIOVASCULAR:  S1, S2.  No murmur.  ABDOMEN:  Bowel sounds positive.  EXTREMITIES:  Notable for some numbness and fairly significant swelling  on her left foot, which was worse medially and swelling present, but not  as bad on the right foot.  She did have 1+ posterior tibial pulses  bilaterally and 1+ to 2+ dorsal pedal pulses bilaterally.  SKIN:  Notable for pretty extensive psoriasis on her arms, legs, face.  Also chest and back.  She had multiple large, scaly plaques.  NEUROLOGIC:  She is alert and oriented to person, place, and time.  PSYCHIATRIC:  Normal and pleasant affect.   LABORATORY DATA:  Sodium 140, potassium 3.5, chloride 107, bicarb 23,  BUN 11, creatinine 0.9, glucose 87.  Calcium 8.9, albumin 2.5, bilirubin  0.6, alkaline phosphatase 69, SGOT 13, SGPT less than 8, protein 6.7.  Hemoglobin 9.4, hematocrit 28.7, white blood cell count 8.1, platelets  341,000.  Uric acid 7.3, borderline high.  ANC 83, MCV 83.  A foot scan  of her left ankle showed some soft tissue swelling and flat foot with  mid foot arthropathy and spurring.  For a more detailed H&P, please refer to the chart.   HOSPITAL COURSE:  1. Bilateral foot swelling with soreness in the setting of well      controlled diabetes and worsening psoriasis.  Although the swelling      improved with use of furosemide, the pain persisted.  Plain films      showed soft tissue swelling, but nothing that point to either gout      or psoriatic arthritis, which is pain secondary to swelling because      she was also diuretic.  Dr. Thomasena Edis performed a dry tap.  Due to a      strong suspician of gout, she was treated with prednisone and     colchicine.  On the Day  3, she  developed  delirium so the      prednisone and indomethacin were stopped. A Rheumatology consult      was obtained on April 10, 2006 for consideration of psoriatic      arthritis as the etiology, which was rejected in favor of gout      flare.  Prednisone was resumed with no recurrence of delirium.  She      improved and was able to walk without difficulty using her walker.   1. Psoriasis, severe, diffuse.  Patient had large scaly plaques spread      out pretty diffusely over her body, some large and scaly, others      showing signs that they were scratched recently and bleeding.  We      started her on Lac-Hydrin at first and noticed that the dryness and      the scaling improved considerably and fairly rapidly.  We      considered starting her on methotrexate, but here relative renal      insufficiency and age were contraindications, so any additional      management decisions were deferred until her outpatinet dermatology      appointment.  Her lesions improved, likely due to the prednisone      and Lac-Hydrin.  ,  2. Delirium.  patient developed overnight agitation  versus      sundowning.  According to her family, this was common  behavior for      her in unfamiliar surroundings.  Prn Haldol was used withj good      results.  3. Chronic anemia.  Iron studies were low, and folate and B12 were      normal.  She is overdue for a colonoscopy and this is something      that she can be worked up on as an outpatient because, again, her      hemoglobin remained stable throughout her hospital course.  4. Hypertension.  No changes were made to her home regimen.   1. Ischemic cardiomyopathy with LVEF of 46% by prior study.  Aspirin,      beta-blocker and ACE inhibitor were continued.  Lasix was restarted      and LE edema improved significantly.  .  2. Progressive dementia.  Reversible causes were ruled out with normal      TSH, B12, and negative RPR.  She is scheduled for an oupatinet  MRI      for further evaluation.  Her last MMSE score one year agowas 24 out      of 30, and should be repeated annually as an outpatient.  She has a      strong family history of Alzheimer's disease.   DISCHARGE LABS AND VITALS:  Afebrile, blood pressure 151/62, pulse 50,  respiratory rate 20, oxygen saturation 97% on room air.   Her last 4 CBGs have been 98, 262, 181, and 94.  Hemoglobin 9.8 up from  9.3, hematocrit 30.3, white blood cell count 10.1, platelets 462,000.  Sodium 141, potassium 4.3, creatinine 1, glucose 104, chloride 107,  bicarb 26, BUN 20.   Labs to follow up on include a hemoglobin A1c and a urine protein  electrophoresis.  Also, the MRI.      Valetta Close, M.D.  Electronically Signed      Duncan Dull, M.D.  Electronically Signed    JC/MEDQ  D:  04/18/2006  T:  04/19/2006  Job:  [id number]   cc:   Dorene Sorrow  Izora Ribas, M.D.  Erasmo Leventhal, M.D.

## 2010-11-05 NOTE — Consult Note (Signed)
NAME:  Emily [name], Emily [name]                  ACCOUNT NO.:  [id number]   MEDICAL RECORD NO.:  [id number]          PATIENT TYPE:  INP   LOCATION:  5706                         FACILITY:  [facility]   PHYSICIAN:  Erasmo Leventhal, M.D.DATE OF BIRTH:  Mar 11, 1931   DATE OF CONSULTATION:  04/08/2006  DATE OF DISCHARGE:                                   CONSULTATION   TIME:  One p.m.   HISTORY OF PRESENT ILLNESS:  [name] is a 77 year old female with a  history of psoriasis.  She has chronic bilateral foot and ankle pain.  She  says her feet have been bad and are tolerable.  However, she has  increasing pain over the past 2 weeks both of the bilateral foot and ankle.  She went to the foot center and they could not give her a diagnosis.  She  was admitted on April 07, 2006 for bilateral foot and ankle pain,  orthopedic consult was requested.  There has been no history of trauma.  No  history of fevers, chills or constitutional symptoms.  Since admission, her  diagnosis has been bilateral foot and ankle with swelling, normocytic  anemia, psoriasis, diabetes mellitus, hypertension, ischemic cardiomyopathy.   ALLERGIES TO MEDICATIONS:  AS IN CHART.   PHYSICAL EXAMINATION:  Awake, alert, moderately uncomfortable, very pleasant  lady.  Multiple psoriatic lesions throughout ___________.  She has bilateral  panus.  There was no evidence of ulcer or infection at this time.  No  evidence of active cellulitis.   The right ankle has mild swelling.  She has diffuse tenderness there.  Very  tender at the mid on the right.  Her circulation is intact.  There is,  again, no erythema, no induration.  Left ankle moderate swelling, diffusely  tender.  Moderate foot pain.  Circulation okay and again no erythema and no  induration.  Skin is okay, except for the psoriatic lesions.   White count is normal, afebrile.  X-rays revealed no evidence of infection  or subluxation.  Mild ankle degenerative change.  There  is some mid foot  arthropathy.   On discussion with the patient, she agreed to an aspiration.  Her nurse  assisted.   PROCEDURE:  Sterile prep, aspiration of left ankle.  No effusion.  No fluid.  Joint was dry.   IMPRESSION/RECOMMENDATION:  Bilateral foot and ankle pain, acute and on  chronic.  No evidence of infection, joint or abscess.  Etiology is unknown.  Specific outburst is arthritis flare-up.  Consider colchicine versus NSAIDs  versus steroids.   I have discussed this with the house officer, Dr. Pearlie Oyster.  She will proceed  with her treatment of choice.  __________followup her.  Recommend  appropriate comfort shoe wear.  Physical therapy consult.  Pain medication.  All questions encouraged and answered per patient and house office agreed  via the phone.           ______________________________  Erasmo Leventhal, M.D.     RAC/MEDQ  D:  04/08/2006  T:  04/09/2006  Job:  [id number]

## 2010-12-22 ENCOUNTER — Other Ambulatory Visit: Payer: Self-pay | Admitting: Internal Medicine

## 2010-12-22 DIAGNOSIS — E7800 Pure hypercholesterolemia, unspecified: Secondary | ICD-10-CM

## 2010-12-23 NOTE — Telephone Encounter (Signed)
.  Refill approved; please schedule a follow-up appointment and notify patient.

## 2010-12-23 NOTE — Telephone Encounter (Signed)
Flag sent to front desk for appointment

## 2011-03-11 LAB — TROPONIN I

## 2011-03-11 LAB — CBC
HCT: 36.3
HCT: 38.1
HCT: 38.3
HCT: 42.8
Hemoglobin: 11.8 — ABNORMAL LOW
Hemoglobin: 11.9 — ABNORMAL LOW
Hemoglobin: 12.2
Hemoglobin: 12.3
Hemoglobin: 13.9
MCHC: 31.8
MCHC: 32.1
MCHC: 32.1
MCHC: 32.4
MCHC: 32.5
MCHC: 32.6
MCV: 81.7
MCV: 82.2
MCV: 82.3
MCV: 82.6
Platelets: 264
Platelets: 267
Platelets: 279
Platelets: 299
RBC: 4.41
RBC: 4.42
RBC: 4.61
RBC: 4.66
RBC: 5.23 — ABNORMAL HIGH
RDW: 15.5
RDW: 15.7 — ABNORMAL HIGH
RDW: 16 — ABNORMAL HIGH
RDW: 16 — ABNORMAL HIGH
RDW: 16.2 — ABNORMAL HIGH
WBC: 5.5
WBC: 5.7
WBC: 5.8
WBC: 6.4

## 2011-03-11 LAB — BASIC METABOLIC PANEL
CO2: 27
CO2: 29
Calcium: 8.9
Calcium: 9.1
Chloride: 104
Creatinine, Ser: 1.03
GFR calc Af Amer: 58 — ABNORMAL LOW
Glucose, Bld: 103 — ABNORMAL HIGH
Sodium: 139

## 2011-03-11 LAB — URINE CULTURE: Colony Count: NO GROWTH

## 2011-03-11 LAB — CARDIAC PANEL(CRET KIN+CKTOT+MB+TROPI)
CK, MB: 1.2
CK, MB: 1.5
Relative Index: 1.2
Relative Index: INVALID
Total CK: 121
Total CK: 94

## 2011-03-11 LAB — TYPE AND SCREEN
ABO/RH(D): O POS
Antibody Screen: NEGATIVE

## 2011-03-11 LAB — URINALYSIS, ROUTINE W REFLEX MICROSCOPIC
Bilirubin Urine: NEGATIVE
Glucose, UA: NEGATIVE
Hgb urine dipstick: NEGATIVE
Ketones, ur: NEGATIVE
Nitrite: NEGATIVE
Protein, ur: NEGATIVE
Specific Gravity, Urine: 1.007
Urobilinogen, UA: 0.2
pH: 7

## 2011-03-11 LAB — PHOSPHORUS: Phosphorus: 3.5

## 2011-03-11 LAB — MAGNESIUM: Magnesium: 2.4

## 2011-03-11 LAB — B-NATRIURETIC PEPTIDE (CONVERTED LAB): Pro B Natriuretic peptide (BNP): 461 — ABNORMAL HIGH

## 2011-03-11 LAB — BASIC METABOLIC PANEL WITH GFR
BUN: 12
CO2: 29
Calcium: 9.1
Chloride: 106
Creatinine, Ser: 1.03
GFR calc non Af Amer: 52 — ABNORMAL LOW
Glucose, Bld: 106 — ABNORMAL HIGH
Potassium: 3.6
Sodium: 144

## 2011-03-11 LAB — COMPREHENSIVE METABOLIC PANEL WITH GFR
ALT: 10
AST: 17
Albumin: 3.7
Alkaline Phosphatase: 100
BUN: 9
CO2: 29
Calcium: 9.2
Chloride: 107
Creatinine, Ser: 0.91
GFR calc non Af Amer: >60
Glucose, Bld: 89
Potassium: 3.5
Sodium: 144
Total Bilirubin: 0.8
Total Protein: 7.7

## 2011-03-11 LAB — LIPID PANEL
Cholesterol: 165
Total CHOL/HDL Ratio: 3.8

## 2011-03-11 LAB — CK TOTAL AND CKMB (NOT AT ARMC)
CK, MB: 1.5
Total CK: 128

## 2011-03-11 LAB — ABO/RH: ABO/RH(D): O POS

## 2011-03-11 LAB — TSH: TSH: 1.783

## 2011-04-19 ENCOUNTER — Other Ambulatory Visit: Payer: Self-pay | Admitting: Internal Medicine

## 2011-04-20 NOTE — Telephone Encounter (Signed)
Patient has not been seen in over 1 year, and needs an office visit.

## 2011-04-21 NOTE — Telephone Encounter (Signed)
Denial called to [facility].  Attempts to call pt to tell her that she needs to come in for an appointment- no answer.  Unable to leave a message.  Angelina Ok, RN 04/21/2011 2:26 PM

## 2011-05-14 ENCOUNTER — Other Ambulatory Visit: Payer: Self-pay | Admitting: Internal Medicine

## 2011-05-16 NOTE — Telephone Encounter (Signed)
As previously noted, patient has not been seen in over 1 year, and needs an office visit.

## 2011-05-17 NOTE — Telephone Encounter (Signed)
Message sent to front desk for an appt. 

## 2011-05-27 ENCOUNTER — Ambulatory Visit: Payer: Self-pay | Admitting: Internal Medicine

## 2011-09-20 ENCOUNTER — Other Ambulatory Visit: Payer: Self-pay | Admitting: *Deleted

## 2011-09-20 NOTE — Telephone Encounter (Signed)
Pt takes 1 1/2 tabs bid so should get # 90 a month.

## 2011-09-20 NOTE — Telephone Encounter (Signed)
Patient has not been seen since June of 2011 and has no-showed for her last 3 scheduled clinic appointments.  I cannot continue refilling her medications if she will not come in for follow up.  Please contact her and find out if she has established care with another physician.  If she wishes to continue here, then please schedule an appointment within 1 month with any available physician in our clinic; if she agrees to come in, I will refill the medication.

## 2011-09-23 NOTE — Telephone Encounter (Signed)
Have tried to call pt several times. Have left messages. Will try again on Monday.

## 2011-09-27 NOTE — Telephone Encounter (Signed)
I am having your name removed as PCP. Please deny the coreg.

## 2011-09-27 NOTE — Telephone Encounter (Signed)
Called pt and she is seeing Dr Kathrynn Speed at [facility].  He is doing all her refills.

## 2011-10-31 ENCOUNTER — Encounter (HOSPITAL_COMMUNITY): Payer: Self-pay

## 2011-10-31 ENCOUNTER — Emergency Department (HOSPITAL_COMMUNITY)
Admission: EM | Admit: 2011-10-31 | Discharge: 2011-10-31 | Disposition: A | Discharge status: Home / Self Care | Payer: Medicare Other | Attending: Emergency Medicine | Admitting: Emergency Medicine

## 2011-10-31 DIAGNOSIS — R1110 Vomiting, unspecified: Secondary | ICD-10-CM | POA: Insufficient documentation

## 2011-10-31 DIAGNOSIS — F0280 Dementia in other diseases classified elsewhere without behavioral disturbance: Secondary | ICD-10-CM | POA: Insufficient documentation

## 2011-10-31 DIAGNOSIS — Z79899 Other long term (current) drug therapy: Secondary | ICD-10-CM | POA: Insufficient documentation

## 2011-10-31 DIAGNOSIS — I10 Essential (primary) hypertension: Secondary | ICD-10-CM | POA: Insufficient documentation

## 2011-10-31 DIAGNOSIS — E119 Type 2 diabetes mellitus without complications: Secondary | ICD-10-CM | POA: Insufficient documentation

## 2011-10-31 DIAGNOSIS — G309 Alzheimer's disease, unspecified: Secondary | ICD-10-CM | POA: Insufficient documentation

## 2011-10-31 HISTORY — DX: Alzheimer's disease, unspecified: F02.80

## 2011-10-31 HISTORY — DX: Alzheimer's disease, unspecified: G30.9

## 2011-10-31 HISTORY — DX: Essential (primary) hypertension: I10

## 2011-10-31 LAB — BASIC METABOLIC PANEL
BUN: 17 mg/dL (ref 6–23)
Calcium: 8.8 mg/dL (ref 8.4–10.5)
Chloride: 102 mEq/L (ref 96–112)
Creatinine, Ser: 1.12 mg/dL — ABNORMAL HIGH (ref 0.50–1.10)
GFR calc Af Amer: 53 mL/min — ABNORMAL LOW (ref >90)
GFR calc non Af Amer: 45 mL/min — ABNORMAL LOW (ref >90)

## 2011-10-31 LAB — URINALYSIS, ROUTINE W REFLEX MICROSCOPIC
Bilirubin Urine: NEGATIVE
Ketones, ur: NEGATIVE mg/dL
Leukocytes, UA: NEGATIVE
Nitrite: NEGATIVE
Protein, ur: NEGATIVE mg/dL
Urobilinogen, UA: 1 mg/dL (ref 0.0–1.0)
pH: 7 (ref 5.0–8.0)

## 2011-10-31 LAB — CBC
HCT: 35.3 % — ABNORMAL LOW (ref 36.0–46.0)
Hemoglobin: 12 g/dL (ref 12.0–15.0)
MCH: 27.1 pg (ref 26.0–34.0)
MCHC: 34 g/dL (ref 30.0–36.0)
RDW: 15.1 % (ref 11.5–15.5)

## 2011-10-31 LAB — DIFFERENTIAL
Basophils Absolute: 0 10*3/uL (ref 0.0–0.1)
Basophils Relative: 0 % (ref 0–1)
Eosinophils Absolute: 0.2 10*3/uL (ref 0.0–0.7)
Eosinophils Relative: 2 % (ref 0–5)
Monocytes Absolute: 0.3 10*3/uL (ref 0.1–1.0)
Monocytes Relative: 3 % (ref 3–12)
Neutro Abs: 7.6 10*3/uL (ref 1.7–7.7)

## 2011-10-31 MED ORDER — ONDANSETRON HCL 4 MG/2ML IJ SOLN
INTRAMUSCULAR | Status: AC
  Administered 2011-10-31: 4 mg via INTRAVENOUS
  Filled 2011-10-31: qty 2

## 2011-10-31 MED ORDER — ONDANSETRON HCL 4 MG PO TABS: 4.0000 mg | ORAL_TABLET | Freq: Four times a day (QID) | ORAL | Status: AC

## 2011-10-31 MED ORDER — ONDANSETRON HCL 4 MG/2ML IJ SOLN
4.0000 mg | Freq: Once | INTRAMUSCULAR | Status: AC
  Administered 2011-10-31: 4 mg via INTRAVENOUS
  Filled 2011-10-31: qty 2

## 2011-10-31 MED ORDER — SODIUM CHLORIDE 0.9 % IV BOLUS (SEPSIS)
500.0000 mL | Freq: Once | INTRAVENOUS | Status: AC
  Administered 2011-10-31: 500 mL via INTRAVENOUS

## 2011-10-31 NOTE — ED Notes (Signed)
Patient discharge with written and verbal instructions with daughter with steady gait. Respirations equal and unlabored. Skin warm and dry. No acute distress noted.

## 2011-10-31 NOTE — ED Provider Notes (Signed)
History     CSN: [id number]  Arrival date & time 10/31/11  2022   First MD Initiated Contact with Patient 10/31/11 2041      Chief Complaint  Patient presents with  . Emesis  . Nausea    (Consider location/radiation/quality/duration/timing/severity/associated sxs/prior treatment) Patient is a 78 y.o. female presenting with vomiting. The history is provided by a relative. The history is limited by the condition of the patient.  Emesis    the patient is a 78 year old, female, with profound dementia.  Level V caveat applies for dementia.  Her daughter, who lives with the patient.  Noted that she has had decreased activity.  For the past 3 days.  Today.  She had vomiting.  She has not had diarrhea.  She has not had a cough, or any other symptoms.  The patient denies pain anywhere  Past Medical History  Diagnosis Date  . Diabetes mellitus   . Hypertension   . Alzheimer disease     History reviewed. No pertinent past surgical history.  History reviewed. No pertinent family history.  History  Substance Use Topics  . Smoking status: Not on file  . Smokeless tobacco: Not on file  . Alcohol Use: No    OB History    Grav Para Term Preterm Abortions TAB SAB Ect Mult Living                  Review of Systems  Unable to perform ROS Gastrointestinal: Positive for vomiting.    Allergies  Review of patient's allergies indicates no known allergies.  Home Medications   Current Outpatient Rx  Name Route Sig Dispense Refill  . AMLODIPINE BESYLATE 10 MG PO TABS Oral Take 10 mg by mouth daily.      . ASPIRIN 325 MG PO TABS Oral Take 325 mg by mouth daily.     . ASPIRIN-ACETAMINOPHEN-CAFFEINE 250-250-65 MG PO TABS Oral Take 1 tablet by mouth every 6 (six) hours as needed. Pain.    Marland Kitchen CARVEDILOL 12.5 MG PO TABS Oral Take 18.75 mg by mouth 2 (two) times daily with a meal.     . FUROSEMIDE 40 MG PO TABS  TAKE ONE TABLET BY MOUTH EVERY DAY 30 tablet 11  . LISINOPRIL 10 MG PO TABS  Oral Take 10 mg by mouth daily.    Marland Kitchen METHOTREXATE SODIUM 5 MG PO TABS Oral Take 20 mg by mouth once a week. Take 4 tablets by mouth every week on Tuesday. Caution: Chemotherapy. Protect from light.    Marland Kitchen MELATONIN PO Oral Take 1 tablet by mouth at bedtime.     Marland Kitchen POTASSIUM CHLORIDE CRYS ER 20 MEQ PO TBCR Oral Take 40 mEq by mouth daily.     Marland Kitchen PRAVASTATIN SODIUM 40 MG PO TABS Oral Take 1 tablet (40 mg total) by mouth daily. 30 tablet 1  . VITAMIN D (ERGOCALCIFEROL) 50000 UNITS PO CAPS Oral Take 50,000 Units by mouth 2 (two) times a week.    Marland Kitchen LISINOPRIL 10 MG PO TABS Oral Take 1 tablet (10 mg total) by mouth daily. 30 tablet 11    BP 159/79  Pulse 55  Temp(Src) 97.6 F (36.4 C) (Oral)  Resp 15  SpO2 100%  Physical Exam  Nursing note and vitals reviewed. Constitutional: She appears well-developed and well-nourished. No distress.  HENT:  Head: Normocephalic and atraumatic.  Eyes: Conjunctivae and EOM are normal.  Neck: Normal range of motion. Neck supple.  Cardiovascular: Regular rhythm.   No murmur heard.  Bradycardia  Pulmonary/Chest: Effort normal and breath sounds normal. No respiratory distress.  Abdominal: Soft. She exhibits no distension. There is no tenderness.  Musculoskeletal: Normal range of motion. She exhibits no edema and no tenderness.  Neurological: She is alert. No cranial nerve deficit.  Skin: Skin is warm and dry.  Psychiatric:       Confused at baseline, per family members    ED Course  Procedures (including critical care time) 78 year old, female, with dementia, presents to the emergency department with 3 days with decreased activity, and vomiting.  Today.  She appears to be in no distress.  We will establish an IV and give her fluids, and perform laboratory testing, to see if there are any consequences from the vomiting, or a potential etiology.  Labs Reviewed  CBC - Abnormal; Notable for the following:    HCT 35.3 (*)    All other components within  normal limits  DIFFERENTIAL - Abnormal; Notable for the following:    Neutrophils Relative 86 (*)    Lymphocytes Relative 9 (*)    All other components within normal limits  BASIC METABOLIC PANEL - Abnormal; Notable for the following:    Glucose, Bld 172 (*)    Creatinine, Ser 1.12 (*)    GFR calc non Af Amer 45 (*)    GFR calc Af Amer 53 (*)    All other components within normal limits  URINALYSIS, ROUTINE W REFLEX MICROSCOPIC   No results found.   No diagnosis found.    MDM  Vomiting Resolved No signs dehydration or toxicity        Cheri Guppy, MD 10/31/11 2256

## 2011-10-31 NOTE — ED Notes (Signed)
Pt from home, complains of n,v for one hour, per ems pt has some dementia and history isnt clear

## 2011-10-31 NOTE — Discharge Instructions (Signed)
Your blood and urine tests do not show any signs of significant illness or dehydration.  Use Zofran for recurrent vomiting.  Followup with your Dr. if your symptoms.  Last more than 2-3 days.  Return for worse or uncontrolled symptoms

## 2011-11-16 ENCOUNTER — Emergency Department (HOSPITAL_BASED_OUTPATIENT_CLINIC_OR_DEPARTMENT_OTHER)
Admission: EM | Admit: 2011-11-16 | Discharge: 2011-11-17 | Disposition: A | Discharge status: Home / Self Care | Payer: Medicare Other | Attending: Emergency Medicine | Admitting: Emergency Medicine

## 2011-11-16 ENCOUNTER — Emergency Department (HOSPITAL_BASED_OUTPATIENT_CLINIC_OR_DEPARTMENT_OTHER): Payer: Medicare Other

## 2011-11-16 ENCOUNTER — Encounter (HOSPITAL_BASED_OUTPATIENT_CLINIC_OR_DEPARTMENT_OTHER): Payer: Self-pay

## 2011-11-16 DIAGNOSIS — R0989 Other specified symptoms and signs involving the circulatory and respiratory systems: Secondary | ICD-10-CM | POA: Insufficient documentation

## 2011-11-16 DIAGNOSIS — R509 Fever, unspecified: Secondary | ICD-10-CM | POA: Insufficient documentation

## 2011-11-16 DIAGNOSIS — E119 Type 2 diabetes mellitus without complications: Secondary | ICD-10-CM | POA: Insufficient documentation

## 2011-11-16 DIAGNOSIS — R109 Unspecified abdominal pain: Secondary | ICD-10-CM | POA: Insufficient documentation

## 2011-11-16 DIAGNOSIS — I517 Cardiomegaly: Secondary | ICD-10-CM | POA: Insufficient documentation

## 2011-11-16 DIAGNOSIS — F0280 Dementia in other diseases classified elsewhere without behavioral disturbance: Secondary | ICD-10-CM | POA: Insufficient documentation

## 2011-11-16 DIAGNOSIS — G309 Alzheimer's disease, unspecified: Secondary | ICD-10-CM | POA: Insufficient documentation

## 2011-11-16 DIAGNOSIS — I10 Essential (primary) hypertension: Secondary | ICD-10-CM | POA: Insufficient documentation

## 2011-11-16 LAB — COMPREHENSIVE METABOLIC PANEL
AST: 12 U/L (ref 0–37)
Albumin: 3.2 g/dL — ABNORMAL LOW (ref 3.5–5.2)
BUN: 22 mg/dL (ref 6–23)
Creatinine, Ser: 1.3 mg/dL — ABNORMAL HIGH (ref 0.50–1.10)
Potassium: 4.1 mEq/L (ref 3.5–5.1)
Total Protein: 7.1 g/dL (ref 6.0–8.3)

## 2011-11-16 LAB — CBC
Hemoglobin: 11 g/dL — ABNORMAL LOW (ref 12.0–15.0)
MCH: 27.4 pg (ref 26.0–34.0)
MCHC: 34.9 g/dL (ref 30.0–36.0)
RDW: 15.3 % (ref 11.5–15.5)

## 2011-11-16 LAB — DIFFERENTIAL
Basophils Absolute: 0 10*3/uL (ref 0.0–0.1)
Basophils Relative: 0 % (ref 0–1)
Eosinophils Absolute: 0.2 10*3/uL (ref 0.0–0.7)
Monocytes Absolute: 0.4 10*3/uL (ref 0.1–1.0)
Monocytes Relative: 5 % (ref 3–12)
Neutro Abs: 5.8 10*3/uL (ref 1.7–7.7)
Neutrophils Relative %: 81 % — ABNORMAL HIGH (ref 43–77)

## 2011-11-16 LAB — URINALYSIS, ROUTINE W REFLEX MICROSCOPIC
Glucose, UA: NEGATIVE mg/dL
Leukocytes, UA: NEGATIVE
Nitrite: NEGATIVE
Specific Gravity, Urine: 1.015 (ref 1.005–1.030)
pH: 5.5 (ref 5.0–8.0)

## 2011-11-16 LAB — LIPASE, BLOOD: Lipase: 25 U/L (ref 11–59)

## 2011-11-16 MED ORDER — SODIUM CHLORIDE 0.9 % IV BOLUS (SEPSIS): 500.0000 mL | Freq: Once | INTRAVENOUS | Status: DC

## 2011-11-16 MED ORDER — SODIUM CHLORIDE 0.9 % IV BOLUS (SEPSIS)
1000.0000 mL | Freq: Once | INTRAVENOUS | Status: AC
  Administered 2011-11-16: 1000 mL via INTRAVENOUS

## 2011-11-16 NOTE — Discharge Instructions (Signed)
Fever, Adult A fever is a higher than normal body temperature. In an adult, an oral temperature around 98.6 F (37 C) is considered normal. A temperature of 100.4 F (38 C) or higher is generally considered a fever. Mild or moderate fevers generally have no long-term effects and often do not require treatment. Extreme fever (greater than or equal to 106 F or 41.1 C) can cause seizures. The sweating that may occur with repeated or prolonged fever may cause dehydration. Elderly people can develop confusion during a fever. A measured temperature can vary with:  Age.   Time of day.   Method of measurement (mouth, underarm, rectal, or ear).  The fever is confirmed by taking a temperature with a thermometer. Temperatures can be taken different ways. Some methods are accurate and some are not.  An oral temperature is used most commonly. Electronic thermometers are fast and accurate.   An ear temperature will only be accurate if the thermometer is positioned as recommended by the manufacturer.   A rectal temperature is accurate and done for those adults who have a condition where an oral temperature cannot be taken.   An underarm (axillary) temperature is not accurate and not recommended.  Fever is a symptom, not a disease.  CAUSES   Infections commonly cause fever.   Some noninfectious causes for fever include:   Some arthritis conditions.   Some thyroid or adrenal gland conditions.   Some immune system conditions.   Some types of cancer.   A medicine reaction.   High doses of certain street drugs such as methamphetamine.   Dehydration.   Exposure to high outside or room temperatures.   Occasionally, the source of a fever cannot be determined. This is sometimes called a "fever of unknown origin" (FUO).   Some situations may lead to a temporary rise in body temperature that may go away on its own. Examples are:   Childbirth.   Surgery.   Intense exercise.  HOME CARE  INSTRUCTIONS   Take appropriate medicines for fever. Follow dosing instructions carefully. If you use acetaminophen to reduce the fever, be careful to avoid taking other medicines that also contain acetaminophen. Do not take aspirin for a fever if you are younger than age 19. There is an association with Reye's syndrome. Reye's syndrome is a rare but potentially deadly disease.   If an infection is present and antibiotics have been prescribed, take them as directed. Finish them even if you start to feel better.   Rest as needed.   Maintain an adequate fluid intake. To prevent dehydration during an illness with prolonged or recurrent fever, you may need to drink extra fluid.Drink enough fluids to keep your urine clear or pale yellow.   Sponging or bathing with room temperature water may help reduce body temperature. Do not use ice water or alcohol sponge baths.   Dress comfortably, but do not over-bundle.  SEEK MEDICAL CARE IF:   You are unable to keep fluids down.   You develop vomiting or diarrhea.   You are not feeling at least partly better after 3 days.   You develop new symptoms or problems.  SEEK IMMEDIATE MEDICAL CARE IF:   You have shortness of breath or trouble breathing.   You develop excessive weakness.   You are dizzy or you faint.   You are extremely thirsty or you are making little or no urine.   You develop new pain that was not there before (such as in the head,   neck, chest, back, or abdomen).   You have persistant vomiting and diarrhea for more than 1 to 2 days.   You develop a stiff neck or your eyes become sensitive to light.   You develop a skin rash.   You have a fever or persistent symptoms for more than 2 to 3 days.   You have a fever and your symptoms suddenly get worse.  MAKE SURE YOU:   Understand these instructions.   Will watch your condition.   Will get help right away if you are not doing well or get worse.  Document Released:  11/30/2000 Document Revised: 05/26/2011 Document Reviewed: 04/07/2011 [employer] Patient Information 2012 [employer]. 

## 2011-11-16 NOTE — ED Notes (Signed)
EMS sts pt's family reports ABD pain X's 24/hrs sent for eval

## 2011-11-16 NOTE — ED Provider Notes (Addendum)
History     CSN: [id number]  Arrival date & time 11/16/11  2141   First MD Initiated Contact with Patient 11/16/11 2144      Chief Complaint  Patient presents with  . Abdominal Pain  Level 5 dementia   (Consider location/radiation/quality/duration/timing/severity/associated sxs/prior treatment) HPI  Patient unable to give any history.  Daughter states that she has been complaining of abdominal pain for 2 weeks. She was seen recently in the emergency department for vomiting. Daughter states that she grabs her upper abdomen when she is walking. She has been taking by mouth well. She has not been vomiting since the episode on 513. She has not had any diarrhea or fever noted. She has not had any known weight loss. She has not had any dark or tarry stools.  Past Medical History  Diagnosis Date  . Diabetes mellitus   . Hypertension   . Alzheimer disease     History reviewed. No pertinent past surgical history.  History reviewed. No pertinent family history.  History  Substance Use Topics  . Smoking status: Not on file  . Smokeless tobacco: Not on file  . Alcohol Use: No    OB History    Grav Para Term Preterm Abortions TAB SAB Ect Mult Living                  Review of Systems  Unable to perform ROS   Allergies  Review of patient's allergies indicates no known allergies.  Home Medications   Current Outpatient Rx  Name Route Sig Dispense Refill  . AMLODIPINE BESYLATE 10 MG PO TABS Oral Take 10 mg by mouth daily.      . ASPIRIN 325 MG PO TABS Oral Take 325 mg by mouth daily.     Marland Kitchen CARVEDILOL 12.5 MG PO TABS Oral Take 18.75 mg by mouth 2 (two) times daily with a meal.     . DONEPEZIL HCL 10 MG PO TABS Oral Take 10 mg by mouth at bedtime as needed.    Marland Kitchen FOLIC ACID 1 MG PO TABS Oral Take 1 mg by mouth daily.    . FUROSEMIDE 40 MG PO TABS  TAKE ONE TABLET BY MOUTH EVERY DAY 30 tablet 11  . LISINOPRIL 40 MG PO TABS Oral Take 40 mg by mouth daily.    Marland Kitchen METHOTREXATE 2.5  MG PO TABS Oral Take 2.5 mg by mouth once a week. Caution:Chemotherapy. Protect from light.  Take four tablets by mouth once a week.    Marland Kitchen POTASSIUM CHLORIDE CRYS ER 20 MEQ PO TBCR Oral Take 40 mEq by mouth daily.     . QUETIAPINE FUMARATE 50 MG PO TABS Oral Take 50 mg by mouth at bedtime.    Marland Kitchen TEMAZEPAM 15 MG PO CAPS Oral Take 15 mg by mouth at bedtime as needed.    . ASPIRIN-ACETAMINOPHEN-CAFFEINE 250-250-65 MG PO TABS Oral Take 1 tablet by mouth every 6 (six) hours as needed. Pain.    Marland Kitchen LISINOPRIL 10 MG PO TABS Oral Take 1 tablet (10 mg total) by mouth daily. 30 tablet 11  . MELATONIN PO Oral Take 1 tablet by mouth at bedtime.     Marland Kitchen PRAVASTATIN SODIUM 40 MG PO TABS Oral Take 1 tablet (40 mg total) by mouth daily. 30 tablet 1  . VITAMIN D (ERGOCALCIFEROL) 50000 UNITS PO CAPS Oral Take 50,000 Units by mouth 2 (two) times a week.      BP 137/67  Pulse 73  Temp(Src) 100.4 F (  38 C) (Rectal)  Resp 18  SpO2 96%  Physical Exam  Nursing note and vitals reviewed. Constitutional: She appears well-developed and well-nourished.  HENT:  Head: Normocephalic and atraumatic.  Eyes: Conjunctivae and EOM are normal. Pupils are equal, round, and reactive to light.  Neck: Normal range of motion. Neck supple.  Cardiovascular: Normal rate, regular rhythm, normal heart sounds and intact distal pulses.   Pulmonary/Chest: Effort normal and breath sounds normal.  Abdominal: Soft. Bowel sounds are normal. She exhibits no distension and no mass. There is no tenderness. There is no rebound and no guarding.  Musculoskeletal: Normal range of motion.  Neurological: She is alert.  Skin: Skin is warm and dry.       Erythematous skin under bilateral breasts consistent with candidiasis.  Psychiatric: She has a normal mood and affect. Thought content normal.    ED Course  Procedures (including critical care time)  Labs Reviewed  CBC - Abnormal; Notable for the following:    Hemoglobin 11.0 (*)    HCT 31.5 (*)     All other components within normal limits  DIFFERENTIAL - Abnormal; Notable for the following:    Neutrophils Relative 81 (*)    Lymphocytes Relative 10 (*)    All other components within normal limits  COMPREHENSIVE METABOLIC PANEL - Abnormal; Notable for the following:    Glucose, Bld 241 (*)    Creatinine, Ser 1.30 (*)    Albumin 3.2 (*)    GFR calc non Af Amer 38 (*)    GFR calc Af Amer 44 (*)    All other components within normal limits  LIPASE, BLOOD  URINALYSIS, ROUTINE W REFLEX MICROSCOPIC   Dg Chest Port 1 View  11/16/2011  *RADIOLOGY REPORT*  Clinical Data: Abdominal pain for 24 hours, history of diabetes and hypertension and Alzheimer's disease.  PORTABLE CHEST - 1 VIEW  Comparison: 08/15/2007  Findings: Cardiomegaly.  Mild vascular congestion. No infiltrates or failure.  Severe degenerative change both shoulders.  Calcified tortuous aorta.  IMPRESSION: Stable cardiomegaly and mild vascular congestion.  No active infiltrates.  Original Report Authenticated By: Elsie Stain, M.D.     No diagnosis found.  Results for orders placed during the hospital encounter of 11/16/11  CBC      Component Value Range   WBC 7.1  4.0 - 10.5 (K/uL)   RBC 4.01  3.87 - 5.11 (MIL/uL)   Hemoglobin 11.0 (*) 12.0 - 15.0 (g/dL)   HCT 16.1 (*) 09.6 - 46.0 (%)   MCV 78.6  78.0 - 100.0 (fL)   MCH 27.4  26.0 - 34.0 (pg)   MCHC 34.9  30.0 - 36.0 (g/dL)   RDW 04.5  40.9 - 81.1 (%)   Platelets 209  150 - 400 (K/uL)  DIFFERENTIAL      Component Value Range   Neutrophils Relative 81 (*) 43 - 77 (%)   Neutro Abs 5.8  1.7 - 7.7 (K/uL)   Lymphocytes Relative 10 (*) 12 - 46 (%)   Lymphs Abs 0.7  0.7 - 4.0 (K/uL)   Monocytes Relative 5  3 - 12 (%)   Monocytes Absolute 0.4  0.1 - 1.0 (K/uL)   Eosinophils Relative 3  0 - 5 (%)   Eosinophils Absolute 0.2  0.0 - 0.7 (K/uL)   Basophils Relative 0  0 - 1 (%)   Basophils Absolute 0.0  0.0 - 0.1 (K/uL)  COMPREHENSIVE METABOLIC PANEL      Component  Value Range  Sodium 139  135 - 145 (mEq/L)   Potassium 4.1  3.5 - 5.1 (mEq/L)   Chloride 105  96 - 112 (mEq/L)   CO2 25  19 - 32 (mEq/L)   Glucose, Bld 241 (*) 70 - 99 (mg/dL)   BUN 22  6 - 23 (mg/dL)   Creatinine, Ser 7.82 (*) 0.50 - 1.10 (mg/dL)   Calcium 8.9  8.4 - 95.6 (mg/dL)   Total Protein 7.1  6.0 - 8.3 (g/dL)   Albumin 3.2 (*) 3.5 - 5.2 (g/dL)   AST 12  0 - 37 (U/L)   ALT 5  0 - 35 (U/L)   Alkaline Phosphatase 104  39 - 117 (U/L)   Total Bilirubin 0.5  0.3 - 1.2 (mg/dL)   GFR calc non Af Amer 38 (*) >90 (mL/min)   GFR calc Af Amer 44 (*) >90 (mL/min)  LIPASE, BLOOD      Component Value Range   Lipase 25  11 - 59 (U/L)  URINALYSIS, ROUTINE W REFLEX MICROSCOPIC      Component Value Range   Color, Urine YELLOW  YELLOW    APPearance CLEAR  CLEAR    Specific Gravity, Urine 1.015  1.005 - 1.030    pH 5.5  5.0 - 8.0    Glucose, UA NEGATIVE  NEGATIVE (mg/dL)   Hgb urine dipstick NEGATIVE  NEGATIVE    Bilirubin Urine NEGATIVE  NEGATIVE    Ketones, ur NEGATIVE  NEGATIVE (mg/dL)   Protein, ur NEGATIVE  NEGATIVE (mg/dL)   Urobilinogen, UA 1.0  0.0 - 1.0 (mg/dL)   Nitrite NEGATIVE  NEGATIVE    Leukocytes, UA NEGATIVE  NEGATIVE      MDM   Results for orders placed during the hospital encounter of 11/16/11  CBC      Component Value Range   WBC 7.1  4.0 - 10.5 (K/uL)   RBC 4.01  3.87 - 5.11 (MIL/uL)   Hemoglobin 11.0 (*) 12.0 - 15.0 (g/dL)   HCT 21.3 (*) 08.6 - 46.0 (%)   MCV 78.6  78.0 - 100.0 (fL)   MCH 27.4  26.0 - 34.0 (pg)   MCHC 34.9  30.0 - 36.0 (g/dL)   RDW 57.8  46.9 - 62.9 (%)   Platelets 209  150 - 400 (K/uL)  DIFFERENTIAL      Component Value Range   Neutrophils Relative 81 (*) 43 - 77 (%)   Neutro Abs 5.8  1.7 - 7.7 (K/uL)   Lymphocytes Relative 10 (*) 12 - 46 (%)   Lymphs Abs 0.7  0.7 - 4.0 (K/uL)   Monocytes Relative 5  3 - 12 (%)   Monocytes Absolute 0.4  0.1 - 1.0 (K/uL)   Eosinophils Relative 3  0 - 5 (%)   Eosinophils Absolute 0.2  0.0 - 0.7  (K/uL)   Basophils Relative 0  0 - 1 (%)   Basophils Absolute 0.0  0.0 - 0.1 (K/uL)  COMPREHENSIVE METABOLIC PANEL      Component Value Range   Sodium 139  135 - 145 (mEq/L)   Potassium 4.1  3.5 - 5.1 (mEq/L)   Chloride 105  96 - 112 (mEq/L)   CO2 25  19 - 32 (mEq/L)   Glucose, Bld 241 (*) 70 - 99 (mg/dL)   BUN 22  6 - 23 (mg/dL)   Creatinine, Ser 5.28 (*) 0.50 - 1.10 (mg/dL)   Calcium 8.9  8.4 - 41.3 (mg/dL)   Total Protein 7.1  6.0 - 8.3 (g/dL)  Albumin 3.2 (*) 3.5 - 5.2 (g/dL)   AST 12  0 - 37 (U/L)   ALT 5  0 - 35 (U/L)   Alkaline Phosphatase 104  39 - 117 (U/L)   Total Bilirubin 0.5  0.3 - 1.2 (mg/dL)   GFR calc non Af Amer 38 (*) >90 (mL/min)   GFR calc Af Amer 44 (*) >90 (mL/min)  LIPASE, BLOOD      Component Value Range   Lipase 25  11 - 59 (U/L)  URINALYSIS, ROUTINE W REFLEX MICROSCOPIC      Component Value Range   Color, Urine YELLOW  YELLOW    APPearance CLEAR  CLEAR    Specific Gravity, Urine 1.015  1.005 - 1.030    pH 5.5  5.0 - 8.0    Glucose, UA NEGATIVE  NEGATIVE (mg/dL)   Hgb urine dipstick NEGATIVE  NEGATIVE    Bilirubin Urine NEGATIVE  NEGATIVE    Ketones, ur NEGATIVE  NEGATIVE (mg/dL)   Protein, ur NEGATIVE  NEGATIVE (mg/dL)   Urobilinogen, UA 1.0  0.0 - 1.0 (mg/dL)   Nitrite NEGATIVE  NEGATIVE    Leukocytes, UA NEGATIVE  NEGATIVE        Patient with report of abdominal pain but nontender on exam with no severe abnormalities on labs. She does have a mild temp of 100.4 here. There is no obvious source of this. I've advised her daughter and she will be rechecked by her primary care Dr. tomorrow she understands that should have her return if she appears worse anytime.    Hilario Quarry, MD 11/16/11 0454  Hilario Quarry, MD 11/16/11 2325

## 2012-02-13 ENCOUNTER — Encounter (HOSPITAL_COMMUNITY): Payer: Self-pay | Admitting: Emergency Medicine

## 2012-02-13 ENCOUNTER — Emergency Department (HOSPITAL_COMMUNITY)
Admission: EM | Admit: 2012-02-13 | Discharge: 2012-02-13 | Disposition: A | Discharge status: Home / Self Care | Payer: Medicare Other | Attending: Emergency Medicine | Admitting: Emergency Medicine

## 2012-02-13 ENCOUNTER — Emergency Department (HOSPITAL_COMMUNITY): Payer: Medicare Other

## 2012-02-13 DIAGNOSIS — G309 Alzheimer's disease, unspecified: Secondary | ICD-10-CM | POA: Insufficient documentation

## 2012-02-13 DIAGNOSIS — F0280 Dementia in other diseases classified elsewhere without behavioral disturbance: Secondary | ICD-10-CM | POA: Insufficient documentation

## 2012-02-13 DIAGNOSIS — I10 Essential (primary) hypertension: Secondary | ICD-10-CM | POA: Insufficient documentation

## 2012-02-13 DIAGNOSIS — L4050 Arthropathic psoriasis, unspecified: Secondary | ICD-10-CM

## 2012-02-13 DIAGNOSIS — E119 Type 2 diabetes mellitus without complications: Secondary | ICD-10-CM | POA: Insufficient documentation

## 2012-02-13 HISTORY — DX: Psoriasis, unspecified: L40.9

## 2012-02-13 LAB — URIC ACID: Uric Acid, Serum: 8 mg/dL — ABNORMAL HIGH (ref 2.4–7.0)

## 2012-02-13 MED ORDER — TRAMADOL HCL 50 MG PO TABS
50.0000 mg | ORAL_TABLET | Freq: Once | ORAL | Status: AC
  Administered 2012-02-13: 50 mg via ORAL
  Filled 2012-02-13: qty 1

## 2012-02-13 MED ORDER — PREDNISONE 20 MG PO TABS
40.0000 mg | ORAL_TABLET | Freq: Once | ORAL | Status: AC
  Administered 2012-02-13: 40 mg via ORAL
  Filled 2012-02-13: qty 2

## 2012-02-13 MED ORDER — TRAMADOL HCL 50 MG PO TABS: 50.0000 mg | ORAL_TABLET | Freq: Once | ORAL | Status: AC

## 2012-02-13 MED ORDER — PREDNISONE 20 MG PO TABS: 40.0000 mg | ORAL_TABLET | Freq: Every day | ORAL | Status: AC

## 2012-02-13 NOTE — ED Notes (Signed)
Patient transported to X-ray 

## 2012-02-13 NOTE — ED Notes (Signed)
Pt reports hx of psoriasis, here today for dry scaley skin, with swelling and flaking. Pt sts ongoing problem ,has medication for same and did not use  It today, pt usually uses it and it helps, son sts she said her hand hurt and she just needed to come.

## 2012-02-13 NOTE — Progress Notes (Signed)
Orthopedic Tech Progress Note Patient Details:  Emily [name] 26-Oct-1930 [id number] Velcro stabilizing wrist splint applied to Left wrist Ortho Devices Type of Ortho Device: Velcro wrist splint Ortho Device/Splint Location: Applied to Left wrist Ortho Device/Splint Interventions: Application   Asia R Thompson 02/13/2012, 11:18 AM

## 2012-02-13 NOTE — ED Notes (Signed)
Pt c/o left hand pain and swelling x 3 days; pt denies injury; hand noted to be warm to touch

## 2012-02-13 NOTE — ED Provider Notes (Signed)
History   This chart was scribed for Emily [name]. Oletta Lamas, MD by Melba Coon. The patient was seen in room TR09C/TR09C and the patient's care was started at 9:57AM.    CSN: [id number]  Arrival date & time 02/13/12  4098   None     Chief Complaint  Patient presents with  . Hand Pain    (Consider location/radiation/quality/duration/timing/severity/associated sxs/prior Treatment) A Level 5 Caveat applies due to the condition of the patient. (Alzheimer's disease) The history is provided by the patient and a relative (grandson). The history is limited by the condition of the patient (Alzheimer's disease). No language interpreter was used.   Emily [name] is a 78 y.o. female who presents to the Emergency Department complaining of constant, mild to moderate bilateral hand pain with associated left hand swelling with an onset 3 days ago. Grandson of pt states that pt takes a pill and applies cream for her psoriasis which does not alleviate the present symptoms. No trauma or injury to the hand. No HA, chills, fever, neck pain, sore throat, rash, back pain, CP, SOB, abd pain, n/v/d, dysuria, or extremity weakness, numbness, or tingling. Hx of psoriasis. No hx of gout. No known allergies. No other pertinent medical symptoms. Pt is right-handed.  PCP: [facility]  Past Medical History  Diagnosis Date  . Diabetes mellitus   . Hypertension   . Alzheimer disease   . Psoriasis     History reviewed. No pertinent past surgical history.  History reviewed. No pertinent family history.  History  Substance Use Topics  . Smoking status: Not on file  . Smokeless tobacco: Not on file  . Alcohol Use: No    OB History    Grav Para Term Preterm Abortions TAB SAB Ect Mult Living                  Review of Systems  Constitutional: Negative for fever and chills.  Cardiovascular: Negative for leg swelling.  Musculoskeletal: Positive for joint swelling and arthralgias.  Skin: Positive for  color change and rash. Negative for wound.  Neurological: Negative for weakness and numbness.   10 Systems reviewed and all are negative for acute change except as noted in the HPI.  Allergies  Review of patient's allergies indicates no known allergies.  Home Medications   Current Outpatient Rx  Name Route Sig Dispense Refill  . AMLODIPINE BESYLATE 10 MG PO TABS Oral Take 10 mg by mouth daily.      . ASPIRIN 325 MG PO TABS Oral Take 325 mg by mouth daily.     Marland Kitchen CARVEDILOL 12.5 MG PO TABS Oral Take 18.75 mg by mouth 2 (two) times daily with a meal.     . DONEPEZIL HCL 10 MG PO TABS Oral Take 10 mg by mouth at bedtime.     Marland Kitchen FOLIC ACID 1 MG PO TABS Oral Take 1 mg by mouth daily.    . FUROSEMIDE 40 MG PO TABS Oral Take 40 mg by mouth daily.    Marland Kitchen GLIPIZIDE ER 5 MG PO TB24 Oral Take 5 mg by mouth daily.    Marland Kitchen LISINOPRIL 40 MG PO TABS Oral Take 40 mg by mouth daily.    Marland Kitchen METHOTREXATE 2.5 MG PO TABS Oral Take 10 mg by mouth once a week. Caution:Chemotherapy. Protect from light.    Marland Kitchen MIRTAZAPINE 30 MG PO TABS Oral Take 30 mg by mouth at bedtime.    . NYSTATIN-TRIAMCINOLONE 100000-0.1 UNIT/GM-% EX OINT Topical Apply  1 application topically 2 (two) times daily.    Marland Kitchen POTASSIUM CHLORIDE CRYS ER 20 MEQ PO TBCR Oral Take 40 mEq by mouth daily.     . QUETIAPINE FUMARATE 100 MG PO TABS Oral Take 100 mg by mouth at bedtime.    Marland Kitchen TEMAZEPAM 30 MG PO CAPS Oral Take 30 mg by mouth at bedtime as needed. For sleep    . VITAMIN D (ERGOCALCIFEROL) 50000 UNITS PO CAPS Oral Take 50,000 Units by mouth 2 (two) times a week.    Marland Kitchen PREDNISONE 20 MG PO TABS Oral Take 2 tablets (40 mg total) by mouth daily. 10 tablet 0  . TRAMADOL HCL 50 MG PO TABS Oral Take 1 tablet (50 mg total) by mouth once. 30 tablet 0    BP 141/68  Pulse 70  Temp 98 F (36.7 C) (Oral)  Resp 18  SpO2 97%  Physical Exam  Nursing note and vitals reviewed. Constitutional: She is oriented to person, place, and time. She appears well-developed  and well-nourished. No distress.  HENT:  Head: Normocephalic and atraumatic.  Eyes: EOM are normal.  Neck: Neck supple. No tracheal deviation present.  Cardiovascular: Normal rate.  Exam reveals no decreased pulses.   Pulmonary/Chest: Effort normal. No respiratory distress.  Musculoskeletal: She exhibits edema and tenderness.       Left wrist: She exhibits decreased range of motion, tenderness and swelling. She exhibits no deformity and no laceration.       Right hand: She exhibits tenderness. She exhibits normal range of motion, no deformity and no swelling. Normal strength noted.       Left hand: She exhibits decreased range of motion, tenderness and swelling. She exhibits no bony tenderness, normal capillary refill and no deformity. Normal strength noted.       Decreased ROM to the left wrist.  Neurological: She is alert and oriented to person, place, and time.  Skin: Skin is warm and dry. Rash noted. No abrasion noted.       Minimal warmth to the left hand and wrist.  Psoriasis lesions and dry flaking skin noted to both hands, forearms, face.    Psychiatric: She has a normal mood and affect. Her behavior is normal.    ED Course  Procedures (including critical care time)  DIAGNOSTIC STUDIES: Oxygen Saturation is 97% on room air, normal by my interpretation.    COORDINATION OF CARE:  10:02AM - prednisone, pain meds, wrist brace, uric acid test, and left wrist XR will be ordered for the pt. Pt will talk to pt and family about psoriatic arthritis. Pt is also advised to take off a ring on her left hand. 11:30AM - lab and imaging results reviewed; results are unremarkable. Pt will be Rx steroids and tramadol. Pt will be d/c.   Labs Reviewed  URIC ACID - Abnormal; Notable for the following:    Uric Acid, Serum 8.0 (*)     All other components within normal limits   Dg Wrist Complete Left  02/13/2012  *RADIOLOGY REPORT*  Clinical Data: Bilateral wrist pain.  No known injury.  LEFT  WRIST - COMPLETE 3+ VIEW  Comparison: None.  Findings: The bones are diffusely demineralized.  There is advanced joint space loss with subchondral sclerosis and osteophyte formation at the trapezium/first metacarpal articulation.  There is mild cystic change in the ulnar styloid.  There is chondrocalcinosis of the triangular fibrocartilage.  No acute fracture, dislocation or definite erosive change is seen.  There appears to be mild  dorsal soft tissue swelling.  IMPRESSION: Degenerative changes as described with TFCC chondrocalcinosis and suspected dorsal soft tissue swelling.  No acute osseous findings or definite erosive changes.   Original Report Authenticated By: Gerrianne Scale, M.D.      1. Psoriatic arthritis       MDM  I personally performed the services described in this documentation, which was scribed in my presence. The recorded information has been reviewed and considered.   Pt with no systemic signs or symptoms of infection.  Wrist on left is warm with decreased ROM, but I suspect due to inflammatory condition like psoriatic arthritis or gout.  Will get plain film, uric acid.  I anticipate no acute findings here, will treat with ultram and prednisone.  Pt will need close follow up with PCP at [facility] for further evaluation and ensuring symptom improvement.  Info on psoriatic arthritis given to pt and family member.  Advised that she remove ring from her ring finger since swelling was starting to involved hand and fingers as well.  Right hand pain is likely similar process, not as pronounced.          Emily [name]. Vivek Grealish, MD 02/13/12 1152

## 2012-11-02 ENCOUNTER — Encounter (HOSPITAL_COMMUNITY): Payer: Self-pay | Admitting: Emergency Medicine

## 2012-11-02 ENCOUNTER — Emergency Department (HOSPITAL_COMMUNITY): Payer: Medicare (Managed Care)

## 2012-11-02 ENCOUNTER — Emergency Department (HOSPITAL_COMMUNITY)
Admission: EM | Admit: 2012-11-02 | Discharge: 2012-11-03 | Disposition: A | Discharge status: Skilled Nursing Facility | Payer: Medicare (Managed Care) | Attending: Emergency Medicine | Admitting: Emergency Medicine

## 2012-11-02 DIAGNOSIS — M549 Dorsalgia, unspecified: Secondary | ICD-10-CM | POA: Insufficient documentation

## 2012-11-02 DIAGNOSIS — IMO0002 Reserved for concepts with insufficient information to code with codable children: Secondary | ICD-10-CM | POA: Insufficient documentation

## 2012-11-02 DIAGNOSIS — G309 Alzheimer's disease, unspecified: Secondary | ICD-10-CM | POA: Insufficient documentation

## 2012-11-02 DIAGNOSIS — Z7982 Long term (current) use of aspirin: Secondary | ICD-10-CM | POA: Insufficient documentation

## 2012-11-02 DIAGNOSIS — F0280 Dementia in other diseases classified elsewhere without behavioral disturbance: Secondary | ICD-10-CM | POA: Insufficient documentation

## 2012-11-02 DIAGNOSIS — W19XXXA Unspecified fall, initial encounter: Secondary | ICD-10-CM

## 2012-11-02 DIAGNOSIS — Y939 Activity, unspecified: Secondary | ICD-10-CM | POA: Insufficient documentation

## 2012-11-02 DIAGNOSIS — E119 Type 2 diabetes mellitus without complications: Secondary | ICD-10-CM | POA: Insufficient documentation

## 2012-11-02 DIAGNOSIS — R296 Repeated falls: Secondary | ICD-10-CM | POA: Insufficient documentation

## 2012-11-02 DIAGNOSIS — G8929 Other chronic pain: Secondary | ICD-10-CM | POA: Insufficient documentation

## 2012-11-02 DIAGNOSIS — I10 Essential (primary) hypertension: Secondary | ICD-10-CM | POA: Insufficient documentation

## 2012-11-02 DIAGNOSIS — Z872 Personal history of diseases of the skin and subcutaneous tissue: Secondary | ICD-10-CM | POA: Insufficient documentation

## 2012-11-02 DIAGNOSIS — Y9210 Unspecified residential institution as the place of occurrence of the external cause: Secondary | ICD-10-CM | POA: Insufficient documentation

## 2012-11-02 DIAGNOSIS — Z79899 Other long term (current) drug therapy: Secondary | ICD-10-CM | POA: Insufficient documentation

## 2012-11-02 DIAGNOSIS — F0390 Unspecified dementia without behavioral disturbance: Secondary | ICD-10-CM | POA: Insufficient documentation

## 2012-11-02 NOTE — ED Notes (Signed)
YNW:[id number]<OZ> Expected date:<BR> Expected time:<BR> Means of arrival:<BR> Comments:<BR> EMS/79 yo female from SNF with unwitnessed fall-no LOC

## 2012-11-02 NOTE — ED Notes (Addendum)
Per EMS, pt. From [facility] who was reported for fall, unwitnessed. Pt. Do not remember what had happened but denies LOC, alert oriented x 2. EMS checked pt. For any visible injuries, none noted but pt. Claimed of pain on her left lower back.  With head blocks, c-collar and short spine board in placed. Pt. Was reported oh having chronic back pain. Pt. Able to follows command .

## 2012-11-03 LAB — URINALYSIS, ROUTINE W REFLEX MICROSCOPIC
Leukocytes, UA: NEGATIVE
Nitrite: NEGATIVE
Specific Gravity, Urine: 1.018 (ref 1.005–1.030)
Urobilinogen, UA: 1 mg/dL (ref 0.0–1.0)
pH: 7 (ref 5.0–8.0)

## 2012-11-03 NOTE — ED Provider Notes (Signed)
History     CSN: [id number]  Arrival date & time 11/02/12  2216   First MD Initiated Contact with Patient 11/02/12 2223      Chief Complaint  Patient presents with  . Fall    (Consider location/radiation/quality/duration/timing/severity/associated sxs/prior treatment) Patient is a 79 y.o. female presenting with fall. The history is provided by the patient. No language interpreter was used.  Fall Pertinent negatives include no fever, no abdominal pain and no headaches. Associated symptoms comments: The patient arrives from a nursing home after unwitnessed fall and complaint of back pain. She has a history of dementia and chronic back pain per EMS report. Here, she states her back pain is now better. She denies headache/head injury, neck pain, chest pain, abdominal pain. .    Past Medical History  Diagnosis Date  . Diabetes mellitus   . Hypertension   . Alzheimer disease   . Psoriasis     History reviewed. No pertinent past surgical history.  History reviewed. No pertinent family history.  History  Substance Use Topics  . Smoking status: Not on file  . Smokeless tobacco: Not on file  . Alcohol Use: No    OB History   Grav Para Term Preterm Abortions TAB SAB Ect Mult Living                  Review of Systems  Constitutional: Negative for fever.  HENT: Negative for neck pain.   Respiratory: Negative for shortness of breath.   Cardiovascular: Negative for chest pain.  Gastrointestinal: Negative for abdominal pain.  Genitourinary: Negative for dysuria.  Musculoskeletal: Positive for back pain.  Skin: Negative for wound.  Neurological: Negative for headaches.    Allergies  Review of patient's allergies indicates no known allergies.  Home Medications   Current Outpatient Rx  Name  Route  Sig  Dispense  Refill  . allopurinol (ZYLOPRIM) 300 MG tablet   Oral   Take 150 mg by mouth every morning.         Marland Kitchen amLODipine (NORVASC) 10 MG tablet   Oral   Take 10  mg by mouth every morning.          Marland Kitchen aspirin 325 MG tablet   Oral   Take 650 mg by mouth 2 (two) times daily.          . carvedilol (COREG) 12.5 MG tablet   Oral   Take 18.75 mg by mouth 2 (two) times daily with a meal.          . donepezil (ARICEPT) 10 MG tablet   Oral   Take 10 mg by mouth at bedtime.          . furosemide (LASIX) 40 MG tablet   Oral   Take 40 mg by mouth every morning.          Marland Kitchen glipiZIDE (GLUCOTROL XL) 2.5 MG 24 hr tablet   Oral   Take 2.5 mg by mouth every morning.         Marland Kitchen ketoconazole (NIZORAL) 2 % cream   Topical   Apply 1 application topically every morning.         Marland Kitchen losartan (COZAAR) 100 MG tablet   Oral   Take 100 mg by mouth every morning.         . mirtazapine (REMERON) 30 MG tablet   Oral   Take 30 mg by mouth at bedtime.         . potassium chloride SA (  KLOR-CON M20) 20 MEQ tablet   Oral   Take 20 mEq by mouth 2 (two) times daily.          . temazepam (RESTORIL) 30 MG capsule   Oral   Take 30 mg by mouth at bedtime. For sleep         . traZODone (DESYREL) 100 MG tablet   Oral   Take 100 mg by mouth at bedtime.         . triamcinolone cream (KENALOG) 0.1 %   Topical   Apply 1 application topically every morning. Apply to arms and legs         . cholecalciferol (VITAMIN D) 1000 UNITS tablet   Oral   Take 1,000 Units by mouth every morning.           BP 137/81  Pulse 58  Temp(Src) 97.5 F (36.4 C) (Oral)  Resp 18  SpO2 96%  Physical Exam  Constitutional: She is oriented to person, place, and time. She appears well-developed and well-nourished.  HENT:  Head: Normocephalic.  Eyes: Conjunctivae are normal.  Neck: Normal range of motion. Neck supple.  Cardiovascular: Normal rate and regular rhythm.   Pulmonary/Chest: Effort normal and breath sounds normal.  Abdominal: Soft. Bowel sounds are normal. There is no tenderness. There is no rebound and no guarding.  Musculoskeletal: Normal range  of motion.  No cervical spinal tenderness. Full, pain free range of motion. No palpable thoracic, lumbar or paraspinal tenderness. FROM all extremities. No hip or pelvic tenderness.   Neurological: She is alert and oriented to person, place, and time.  Skin: Skin is warm and dry. No rash noted.  Psychiatric: She has a normal mood and affect.    ED Course  Procedures (including critical care time)  Labs Reviewed  URINALYSIS, ROUTINE W REFLEX MICROSCOPIC   Dg Cervical Spine Complete  11/03/2012   *RADIOLOGY REPORT*  Clinical Data: Fall.  Confusion.  CERVICAL SPINE - COMPLETE 4+ VIEW  Comparison: None.  Findings: 3.5 mm anterior subluxation of C3-4.  Prominent sclerosis and prominent spurring with cortical irregularity at C4, C5, C6, and C7.  Loss of disc height at all levels between C4 and C7.  Mild prevertebral soft tissue swelling in the vicinity of the pharyngoesophageal junction.  Osseous foraminal stenosis due to spurring on the right at C3-4, C4- 5 and C6-7; and on the left at C3-4 and C4-5.  IMPRESSION:  1.  Grade 1 anterolisthesis at C3-4, nonspecific for degenerative disease versus acute subluxation. 2.  No obvious fracture.  Sensitivity for fractures is significantly reduced by the severe degree of spondylosis and degenerative disc disease.  CT scan or MRI should be strongly considered. 3.  Mild prevertebral soft tissue swelling in the vicinity of the pharyngoesophageal junction. 4.  Multilevel osseous foraminal stenosis due to spurring.   Original Report Authenticated By: Gaylyn Rong, M.D.   Dg Lumbar Spine Complete  11/03/2012   *RADIOLOGY REPORT*  Clinical Data: Status post fall.  LUMBAR SPINE - COMPLETE 4+ VIEW  Comparison: None  Findings: Normal alignment of the lumbar spine.  The vertebral body heights are well preserved.  Disc space narrowing and ventral endplate spurring is noted at T11-12 and L5 S1.  There is calcified atherosclerotic change involving the abdominal aorta.   IMPRESSION:  1.  No acute findings identified. 2.  Degenerative disc disease peri   Original Report Authenticated By: Signa Kell, M.D.   Dg Pelvis 1-2 Views  11/03/2012   *RADIOLOGY REPORT*  Clinical Data: Status post fall.  Confusion.  PELVIS - 1-2 VIEW  Comparison: 11/16/2011  Findings: Mild degenerative changes involve the left hip.  The patient is status post right total hip arthroplasty.  The hardware components are in anatomic alignment.  No complicating features noted.  IMPRESSION:  1.  Status post right hip arthroplasty. 2.  No acute findings identified.   Original Report Authenticated By: Signa Kell, M.D.     No diagnosis found.  1. Fall 2. Chronic back pain  MDM  She remains comfortable. Ambulatory without difficulty or complaint of pain.         Arnoldo Hooker, PA-C 11/03/12 [phone]

## 2012-11-03 NOTE — ED Notes (Signed)
Patient transported to CT 

## 2012-11-03 NOTE — ED Provider Notes (Signed)
Medical screening examination/treatment/procedure(s) were performed by non-physician practitioner and as supervising physician I was immediately available for consultation/collaboration.  Sunnie Nielsen, MD 11/03/12 [phone]

## 2012-12-27 ENCOUNTER — Other Ambulatory Visit: Payer: Self-pay

## 2013-03-03 ENCOUNTER — Encounter (HOSPITAL_COMMUNITY): Payer: Self-pay | Admitting: Neurology

## 2013-03-03 ENCOUNTER — Inpatient Hospital Stay (HOSPITAL_COMMUNITY)
Admission: EM | Admit: 2013-03-03 | Discharge: 2013-03-05 | DRG: 378 | Disposition: A | Discharge status: Home / Self Care | Payer: Medicare (Managed Care) | Attending: Internal Medicine | Admitting: Internal Medicine

## 2013-03-03 DIAGNOSIS — I5022 Chronic systolic (congestive) heart failure: Secondary | ICD-10-CM | POA: Diagnosis present

## 2013-03-03 DIAGNOSIS — G4700 Insomnia, unspecified: Secondary | ICD-10-CM | POA: Diagnosis present

## 2013-03-03 DIAGNOSIS — Z9861 Coronary angioplasty status: Secondary | ICD-10-CM

## 2013-03-03 DIAGNOSIS — K922 Gastrointestinal hemorrhage, unspecified: Secondary | ICD-10-CM

## 2013-03-03 DIAGNOSIS — K5730 Diverticulosis of large intestine without perforation or abscess without bleeding: Secondary | ICD-10-CM

## 2013-03-03 DIAGNOSIS — K269 Duodenal ulcer, unspecified as acute or chronic, without hemorrhage or perforation: Secondary | ICD-10-CM | POA: Diagnosis present

## 2013-03-03 DIAGNOSIS — I509 Heart failure, unspecified: Secondary | ICD-10-CM | POA: Diagnosis present

## 2013-03-03 DIAGNOSIS — Z66 Do not resuscitate: Secondary | ICD-10-CM | POA: Diagnosis present

## 2013-03-03 DIAGNOSIS — K259 Gastric ulcer, unspecified as acute or chronic, without hemorrhage or perforation: Secondary | ICD-10-CM | POA: Diagnosis present

## 2013-03-03 DIAGNOSIS — K649 Unspecified hemorrhoids: Secondary | ICD-10-CM | POA: Diagnosis present

## 2013-03-03 DIAGNOSIS — D62 Acute posthemorrhagic anemia: Secondary | ICD-10-CM

## 2013-03-03 DIAGNOSIS — K5731 Diverticulosis of large intestine without perforation or abscess with bleeding: Principal | ICD-10-CM | POA: Diagnosis present

## 2013-03-03 DIAGNOSIS — I2510 Atherosclerotic heart disease of native coronary artery without angina pectoris: Secondary | ICD-10-CM | POA: Diagnosis present

## 2013-03-03 DIAGNOSIS — I959 Hypotension, unspecified: Secondary | ICD-10-CM | POA: Diagnosis present

## 2013-03-03 DIAGNOSIS — E119 Type 2 diabetes mellitus without complications: Secondary | ICD-10-CM | POA: Diagnosis present

## 2013-03-03 DIAGNOSIS — Z96649 Presence of unspecified artificial hip joint: Secondary | ICD-10-CM

## 2013-03-03 DIAGNOSIS — F0280 Dementia in other diseases classified elsewhere without behavioral disturbance: Secondary | ICD-10-CM | POA: Diagnosis present

## 2013-03-03 DIAGNOSIS — Z7982 Long term (current) use of aspirin: Secondary | ICD-10-CM

## 2013-03-03 DIAGNOSIS — K2980 Duodenitis without bleeding: Secondary | ICD-10-CM | POA: Diagnosis present

## 2013-03-03 DIAGNOSIS — D509 Iron deficiency anemia, unspecified: Secondary | ICD-10-CM | POA: Diagnosis present

## 2013-03-03 DIAGNOSIS — I10 Essential (primary) hypertension: Secondary | ICD-10-CM | POA: Diagnosis present

## 2013-03-03 DIAGNOSIS — K279 Peptic ulcer, site unspecified, unspecified as acute or chronic, without hemorrhage or perforation: Secondary | ICD-10-CM | POA: Diagnosis present

## 2013-03-03 DIAGNOSIS — Z8711 Personal history of peptic ulcer disease: Secondary | ICD-10-CM

## 2013-03-03 DIAGNOSIS — G309 Alzheimer's disease, unspecified: Secondary | ICD-10-CM | POA: Diagnosis present

## 2013-03-03 DIAGNOSIS — Z79899 Other long term (current) drug therapy: Secondary | ICD-10-CM

## 2013-03-03 DIAGNOSIS — E1129 Type 2 diabetes mellitus with other diabetic kidney complication: Secondary | ICD-10-CM | POA: Diagnosis present

## 2013-03-03 DIAGNOSIS — K2960 Other gastritis without bleeding: Secondary | ICD-10-CM | POA: Diagnosis present

## 2013-03-03 HISTORY — DX: Chronic or unspecified peptic ulcer, site unspecified, with perforation: K27.5

## 2013-03-03 HISTORY — DX: Gastric ulcer, unspecified as acute or chronic, without hemorrhage or perforation: K25.9

## 2013-03-03 HISTORY — DX: Atherosclerotic heart disease of native coronary artery without angina pectoris: I25.10

## 2013-03-03 HISTORY — DX: Unspecified osteoarthritis, unspecified site: M19.90

## 2013-03-03 HISTORY — DX: Unspecified systolic (congestive) heart failure: I50.20

## 2013-03-03 HISTORY — DX: Duodenal ulcer, unspecified as acute or chronic, without hemorrhage or perforation: K26.9

## 2013-03-03 LAB — CBC WITH DIFFERENTIAL/PLATELET
Basophils Relative: 0 % (ref 0–1)
Eosinophils Absolute: 0.1 10*3/uL (ref 0.0–0.7)
Eosinophils Relative: 2 % (ref 0–5)
Lymphs Abs: 0.9 10*3/uL (ref 0.7–4.0)
MCH: 28.3 pg (ref 26.0–34.0)
MCHC: 33.7 g/dL (ref 30.0–36.0)
MCV: 83.9 fL (ref 78.0–100.0)
Platelets: 159 10*3/uL (ref 150–400)
RBC: 3.22 MIL/uL — ABNORMAL LOW (ref 3.87–5.11)

## 2013-03-03 LAB — BASIC METABOLIC PANEL
BUN: 22 mg/dL (ref 6–23)
Calcium: 8.2 mg/dL — ABNORMAL LOW (ref 8.4–10.5)
GFR calc Af Amer: 36 mL/min — ABNORMAL LOW (ref >90)
GFR calc non Af Amer: 31 mL/min — ABNORMAL LOW (ref >90)
Glucose, Bld: 258 mg/dL — ABNORMAL HIGH (ref 70–99)
Sodium: 143 mEq/L (ref 135–145)

## 2013-03-03 LAB — PROTIME-INR: INR: 1.26 (ref 0.00–1.49)

## 2013-03-03 MED ORDER — SODIUM CHLORIDE 0.9 % IV BOLUS (SEPSIS)
500.0000 mL | Freq: Once | INTRAVENOUS | Status: AC
  Administered 2013-03-03: 500 mL via INTRAVENOUS

## 2013-03-03 MED ORDER — ALLOPURINOL 150 MG HALF TABLET
150.0000 mg | ORAL_TABLET | Freq: Every day | ORAL | Status: DC
  Administered 2013-03-04 – 2013-03-05 (×2): 150 mg via ORAL
  Filled 2013-03-03 (×2): qty 1

## 2013-03-03 MED ORDER — SODIUM CHLORIDE 0.9 % IJ SOLN: 3.0000 mL | INTRAMUSCULAR | Status: DC | PRN

## 2013-03-03 MED ORDER — INFLUENZA VAC SPLIT QUAD 0.5 ML IM SUSP
0.5000 mL | INTRAMUSCULAR | Status: AC
  Administered 2013-03-04: 0.5 mL via INTRAMUSCULAR
  Filled 2013-03-03 (×2): qty 0.5

## 2013-03-03 MED ORDER — PANTOPRAZOLE SODIUM 40 MG IV SOLR: 40.0000 mg | Freq: Two times a day (BID) | INTRAVENOUS | Status: DC

## 2013-03-03 MED ORDER — SODIUM CHLORIDE 0.9 % IJ SOLN
3.0000 mL | Freq: Two times a day (BID) | INTRAMUSCULAR | Status: DC
  Administered 2013-03-04 – 2013-03-05 (×3): 3 mL via INTRAVENOUS

## 2013-03-03 MED ORDER — SODIUM CHLORIDE 0.9 % IV SOLN: 250.0000 mL | INTRAVENOUS | Status: DC | PRN

## 2013-03-03 MED ORDER — SODIUM CHLORIDE 0.9 % IV SOLN
80.0000 mg | Freq: Once | INTRAVENOUS | Status: AC
  Administered 2013-03-04: 80 mg via INTRAVENOUS
  Filled 2013-03-03: qty 80

## 2013-03-03 MED ORDER — SODIUM CHLORIDE 0.9 % IV SOLN
8.0000 mg/h | INTRAVENOUS | Status: DC
  Administered 2013-03-04: 8 mg/h via INTRAVENOUS
  Filled 2013-03-03 (×4): qty 80

## 2013-03-03 MED ORDER — INSULIN ASPART 100 UNIT/ML ~~LOC~~ SOLN
0.0000 [IU] | Freq: Four times a day (QID) | SUBCUTANEOUS | Status: DC
  Administered 2013-03-04: 2 [IU] via SUBCUTANEOUS

## 2013-03-03 NOTE — ED Notes (Signed)
Pt on bedside commode, family calling RN into room reporting pt was shaking and went unresponsive. When entering room no shaking was noted, pt was alert, was heaving and proceeded to vomit chunky emesis on the floor. Pt was assisted back into bed and her BP was 70/50. Second line established, Fluid bolus began.

## 2013-03-03 NOTE — ED Notes (Signed)
No signs or symptoms of a transfusion reaction noted at this time. Rate increased to 172ml/hr.

## 2013-03-03 NOTE — ED Provider Notes (Signed)
I saw this patient. She is awake. Her abdomen is benign to exam and. She has bright red blood in her stool here. Initial hemoglobin is 9.1. Had 1 drop her pressure to the high 90s. Blood cells been requested for contusion. Calls were placed to the hospitalist regarding admission. I discussed blood transfusion with the patient and her sister. Family member with power of attorney is en route.    Claudean Kinds, MD 03/03/13 2040

## 2013-03-03 NOTE — ED Provider Notes (Signed)
CSN: [id number]     Arrival date & time 03/03/13  1805 History   First MD Initiated Contact with Patient 03/03/13 1806     Chief Complaint  Patient presents with  . GI Bleeding   (Consider location/radiation/quality/duration/timing/severity/associated sxs/prior Treatment) Patient is a 79 y.o. female presenting with hematochezia. The history is provided by the patient and a relative. No language interpreter was used.  Rectal Bleeding Quality:  Bright red Amount:  Moderate Timing:  Constant Progression:  Worsening Chronicity:  New Context: not anal fissures, not diarrhea, not rectal injury and not rectal pain   Similar prior episodes: no   Relieved by:  Nothing Worsened by:  Defecation Ineffective treatments:  None tried Associated symptoms: no abdominal pain, no fever, no light-headedness and no vomiting   Risk factors: no anticoagulant use and no NSAID use     Past Medical History  Diagnosis Date  . Diabetes mellitus   . Hypertension   . Alzheimer disease   . Psoriasis    No past surgical history on file. No family history on file. History  Substance Use Topics  . Smoking status: Not on file  . Smokeless tobacco: Not on file  . Alcohol Use: No   OB History   Grav Para Term Preterm Abortions TAB SAB Ect Mult Living                 Review of Systems  Constitutional: Negative for fever.  HENT: Negative for congestion, sore throat and rhinorrhea.   Respiratory: Negative for cough and shortness of breath.   Cardiovascular: Negative for chest pain.  Gastrointestinal: Positive for blood in stool and hematochezia. Negative for nausea, vomiting, abdominal pain, diarrhea and constipation.  Genitourinary: Negative for dysuria and hematuria.  Skin: Negative for rash.  Neurological: Negative for syncope, light-headedness and headaches.  All other systems reviewed and are negative.    Allergies  Review of patient's allergies indicates no known allergies.  Home  Medications   Current Outpatient Rx  Name  Route  Sig  Dispense  Refill  . allopurinol (ZYLOPRIM) 300 MG tablet   Oral   Take 150 mg by mouth every morning.         Marland Kitchen amLODipine (NORVASC) 10 MG tablet   Oral   Take 10 mg by mouth every morning.          Marland Kitchen aspirin 325 MG tablet   Oral   Take 650 mg by mouth 2 (two) times daily.          . carvedilol (COREG) 12.5 MG tablet   Oral   Take 18.75 mg by mouth 2 (two) times daily with a meal.          . cholecalciferol (VITAMIN D) 1000 UNITS tablet   Oral   Take 1,000 Units by mouth every morning.         . donepezil (ARICEPT) 10 MG tablet   Oral   Take 10 mg by mouth at bedtime.          . furosemide (LASIX) 40 MG tablet   Oral   Take 40 mg by mouth every morning.          Marland Kitchen glipiZIDE (GLUCOTROL XL) 2.5 MG 24 hr tablet   Oral   Take 2.5 mg by mouth every morning.         Marland Kitchen ketoconazole (NIZORAL) 2 % cream   Topical   Apply 1 application topically every morning.         Marland Kitchen  losartan (COZAAR) 100 MG tablet   Oral   Take 100 mg by mouth every morning.         . mirtazapine (REMERON) 30 MG tablet   Oral   Take 30 mg by mouth at bedtime.         . potassium chloride SA (KLOR-CON M20) 20 MEQ tablet   Oral   Take 20 mEq by mouth 2 (two) times daily.          . temazepam (RESTORIL) 30 MG capsule   Oral   Take 30 mg by mouth at bedtime. For sleep         . traZODone (DESYREL) 100 MG tablet   Oral   Take 100 mg by mouth at bedtime.         . triamcinolone cream (KENALOG) 0.1 %   Topical   Apply 1 application topically every morning. Apply to arms and legs          BP 116/47  Pulse 64  Temp(Src) 97.9 F (36.6 C) (Oral)  Resp 18  SpO2 99% Physical Exam  Nursing note and vitals reviewed. Constitutional: She is oriented to person, place, and time. She appears well-developed and well-nourished.  HENT:  Head: Normocephalic and atraumatic.  Right Ear: External ear normal.  Left Ear:  External ear normal.  Eyes: EOM are normal. Pupils are equal, round, and reactive to light.  Neck: Normal range of motion. Neck supple.  Cardiovascular: Normal rate, regular rhythm, normal heart sounds and intact distal pulses.  Exam reveals no gallop and no friction rub.   No murmur heard. Pulmonary/Chest: Effort normal and breath sounds normal. No respiratory distress. She has no wheezes. She has no rales. She exhibits no tenderness.  Abdominal: Soft. Bowel sounds are normal. She exhibits no distension. There is no tenderness. There is no rebound.  Genitourinary: Guaiac positive stool.  Bright red blood on rectal exam, noted dried red blood on underpants  Musculoskeletal: Normal range of motion. She exhibits no edema and no tenderness.  Lymphadenopathy:    She has no cervical adenopathy.  Neurological: She is alert and oriented to person, place, and time.  Skin: Skin is warm. No rash noted.  Psychiatric: She has a normal mood and affect. Her behavior is normal.    ED Course  Procedures (including critical care time) Labs Review Labs Reviewed  CBC WITH DIFFERENTIAL - Abnormal; Notable for the following:    RBC 3.22 (*)    Hemoglobin 9.1 (*)    HCT 27.0 (*)    Neutrophils Relative % 80 (*)    All other components within normal limits  BASIC METABOLIC PANEL - Abnormal; Notable for the following:    Glucose, Bld 258 (*)    Creatinine, Ser 1.54 (*)    Calcium 8.2 (*)    GFR calc non Af Amer 31 (*)    GFR calc Af Amer 36 (*)    All other components within normal limits  PROTIME-INR - Abnormal; Notable for the following:    Prothrombin Time 15.5 (*)    All other components within normal limits  MRSA PCR SCREENING  APTT  CBC  CBC  CBC  COMPREHENSIVE METABOLIC PANEL  TYPE AND SCREEN  PREPARE RBC (CROSSMATCH)   Imaging Review No results found.  MDM   1. GI bleed    6:07 PM Pt is a 79 y.o. female with pertinent PMHX of DM, HTN, Alzheimer's who presents to the ED with  rectal bleeding. Pt presents  with Bright Red blood per Rectum. Pt noted bright red blood today. Pt unable to sp[ecify amount, entire toilet of bright red blood. Pt stated grossly bloody, unsure if mixed with stool. Pt is not on Coumadin/Xarelto/Apixban/Pradaxa/ASA 81/ASA 325/Plavix/Ticagrelor. Pt denies history of hemorrhoids. Pt denies pain with defecation. Pt denies nausea, vomiting, diarrhea. Denies injury to perineum. Denies foreign body to rectum. No previous history of rectal/GI bleeding. Denies history of Crohn's Disease, Ulcerative colitis, diverticulosis, Diverticulitis. Denies recent antibiotic use/C.diff.   Pt denies: NSAID abuse, ETOH abuse, Smoking, Energy drink use.  On exam:AF, BP 95/59, initially with EMs 140 systolic. Plan for 2 large bore IVs and 500cc bolus. Concern for possible CHF given pt is on lasix and Coreg. On exam abdomen soft, NT. Rectal exam gross bright red blood, no hemorrhoids, no anal fissure or fluctuance.  Differential includes, Upper/Lower GI Bleed, Mesenteric ischemia, Meckel's Diverticulum, Internal/External Hemorrhoids, Anal fissure  Plan for CBC, PT/PTT, INR, Type and screen. Will obtain 2 large bore IVs. Obtain BMP. Will obtain EKg and consent for blood transfusion  EKG personally reviewed by myself showed NSR, LVH Rate of 66, PR , QRS QT/QTC 439/439ms, normal axis,without evidence of new ischemia. Comparison showed similar, indication: GI bleed  Review of labs: APTT within normal limits. INR 1.26. BMp showed elevated creatinine. CBC showed H&H 9.1/27.0. given active bleeding per nursing with each bowel movement will transfuse 2 units of blood. Will consult Internal medicine teaching service for admission for colonoscopy and endoscopy to evaluate GI bleed.  The patient appears reasonably stabilized for admission considering the current resources, flow, and capabilities available in the ED at this time, and I doubt any other [facility] requiring further  screening and/or treatment in the ED prior to admission.   Plan for admission to Internal Medicine teaching service for further evaluation and management. PT transferred to step down VSS.  Labs, EKG and imaging reviewed by myself and considered in medical decision making if ordered.  Imaging interpreted by radiology. Pt was discussed with my attending, Dr. Marlene Bast, MD 03/03/13 [phone]

## 2013-03-03 NOTE — H&P (Signed)
Date: 03/03/2013               Patient Name:  Emily [name] MRN: [id number]  DOB: [birth year] Age / Sex: 79 y.o., female   PCP: No primary provider on file.         Medical Service: Internal Medicine Teaching Service         Attending Physician: Dr. Claudean Kinds, MD    First Contact: Dr. Alvina Chou, MD Pager: [phone]  Second Contact: Dr. Suszanne Conners, MD Pager: [phone]       After Hours (After 5p/  First Contact Pager: [phone]  weekends / holidays): Second Contact Pager: [phone]   Chief Complaint: BRBPR  History of Present Illness: Emily [name] is a 79 y.o. woman with a pmhx of GI bleed (2009, 2/2 duodenal erosion) (2002 perforated pyloric ulcer patch), DM, CAD s/p angioplasty, HTN, Alzheimer Disease who presents with a cc of GI bleed. The majority of the history was obtained from the patients daughter. The patient was in her normal state of health until this morning when she began to appear more confused than normal. At 4 pm, she went to use the bathroom. The family noticed that there was blood in the toiler and all over the bathroom. The patients underwear was soaked with blood. They called EMS. Since then the patient has had 3 bloody bowel movements since then. Her BP was 140 systolic per EMS, and trended down to 85/55, despite 500 cc NS bolus. Hg was 9.1, down from 11 baseline 1 year ago. She had one episode on non-bloody non-billoious emesis in the ED. Internal medicine was then consulted to admit the patient.  Denies, SOB, CP, fever, chills, dysuria, abdominal pain.  Meds: No current facility-administered medications for this encounter.   Current Outpatient Prescriptions  Medication Sig Dispense Refill  . acetaminophen (TYLENOL) 325 MG tablet Take 650 mg by mouth 2 (two) times daily.      Marland Kitchen allopurinol (ZYLOPRIM) 300 MG tablet Take 150 mg by mouth daily.       Marland Kitchen amLODipine (NORVASC) 10 MG tablet Take 10 mg by mouth daily.       . carvedilol (COREG) 12.5 MG tablet Take  18.75 mg by mouth 2 (two) times daily with a meal.       . cholecalciferol (VITAMIN D) 1000 UNITS tablet Take 1,000 Units by mouth daily.       Marland Kitchen donepezil (ARICEPT) 10 MG tablet Take 10 mg by mouth at bedtime.       . furosemide (LASIX) 40 MG tablet Take 40 mg by mouth daily.       Marland Kitchen glipiZIDE (GLUCOTROL XL) 2.5 MG 24 hr tablet Take 2.5 mg by mouth daily.       Marland Kitchen losartan (COZAAR) 100 MG tablet Take 100 mg by mouth daily.       . mirtazapine (REMERON) 30 MG tablet Take 30 mg by mouth at bedtime.      . potassium chloride SA (KLOR-CON M20) 20 MEQ tablet Take 20 mEq by mouth 2 (two) times daily.       . temazepam (RESTORIL) 30 MG capsule Take 30 mg by mouth at bedtime. For sleep      . traZODone (DESYREL) 100 MG tablet Take 100 mg by mouth at bedtime.      . triamcinolone cream (KENALOG) 0.1 % Apply 1 application topically every morning. Apply to arms and legs        Allergies: Allergies as of  03/03/2013  . (No Known Allergies)   Past Medical History  Diagnosis Date  . Diabetes mellitus   . Hypertension   . Alzheimer disease   . Psoriasis   . CAD (coronary artery disease)     Moderate coronary artery disease status post angioplasty to the right coronary artery in 2003 and an ejection fraction 46% on catheterization in 2006.  Marland Kitchen Systolic CHF     EF as low is 47%, but 55% in 2009  . Osteoarthritis   . Perforated ulcer 2002    Perforated pyloric channel ulcer patched by Dr. Lindie Spruce  . Duodenal erosion 2009    EGD by Dr. Arlyce Dice  . Gastric erosion 2009    EGD by Dr. Arlyce Dice   History reviewed. No pertinent past surgical history. No family history on file. History   Social History  . Marital Status: Married    Spouse Name: N/A    Number of Children: N/A  . Years of Education: N/A   Occupational History  . Not on file.   Social History Main Topics  . Smoking status: Never Smoker   . Smokeless tobacco: Not on file  . Alcohol Use: No  . Drug Use: Not on file  . Sexual Activity:  Not on file   Other Topics Concern  . Not on file   Social History Narrative  . No narrative on file    Review of Systems: Pertinent items are noted in HPI.  Physical Exam: Blood pressure 83/56, pulse 66, temperature 98.3 F (36.8 C), temperature source Oral, resp. rate 17, SpO2 100.00%. Physical Exam  Constitutional: She appears well-developed and well-nourished. No distress.  HENT:  Mouth/Throat: Oropharynx is clear and moist. No oropharyngeal exudate.  Eyes: EOM are normal. Pupils are equal, round, and reactive to light.  Cardiovascular: Normal rate, regular rhythm and normal heart sounds.  Exam reveals no friction rub.   No murmur heard. Pulmonary/Chest: Effort normal and breath sounds normal. No respiratory distress. She has no wheezes. She has no rales.  Abdominal: Soft. Bowel sounds are normal. She exhibits no distension. There is no tenderness. There is no rebound and no guarding.  Genitourinary:  Dried blood around the anus, hemorrhoid present and not bleeding.  Neurological: She is alert.  Minimally conversant, slow speech.  Skin: She is not diaphoretic.  Psychiatric: She has a normal mood and affect. Her behavior is normal.     Lab results: Basic Metabolic Panel:  Recent Labs  82/95/62 1950  NA 143  K 4.0  CL 107  CO2 26  GLUCOSE 258*  BUN 22  CREATININE 1.54*  CALCIUM 8.2*  Gap: 10  CBC:  Recent Labs  03/03/13 1950  WBC 6.9  NEUTROABS 5.5  HGB 9.1*  HCT 27.0*  MCV 83.9  PLT 159   Coagulation:  Recent Labs  03/03/13 1950  LABPROT 15.5*  INR 1.26   Other results: EKG: unchanged from previous tracings.  Assessment & Plan by Problem: Active Problems:   HYPERTENSION   CORONARY ARTERY DISEASE   PEPTIC ULCER DISEASE   DUODENITIS, WITHOUT HEMORRHAGE   DIVERTICULOSIS, COLON  # Acute GI Bleed The patient has an ongoing acute GI bleed that has caused a decrease in blood pressure. She continues to have bloody bowel movements. While she  is not tachycardic, she has taken BB, which may be masking tachycardia. The etiology of the GI bleed in uncertain. It is most likely lower given BRBPR and BUN/Cr ratio of less than 35, but a  brisk upper GI bleed is possible. The patient's history of duodenal erosion 2009 and pyloric perforation 2002 are concerning for recurrence as upper GI source. She is known to have diffuse diverticulosis and hemorrhoids which may be likely causes of lower GI bleed. - Transfuse 1 U PRBC now and have 2 Units on standby - Transfuse to maintain Hg >8.0, and use NS to maintain MAP >60 - PPI drip - Consulted GI - Per GI, perform technitium scan if patient decompensates overnight, if not will follow in am - Need records from [facility]  # HTN - hold home anti-hypertensives (lasix, amlodipine, coreg)  # CAD - Hold BB, ASA  # Dementia -Continue Aricept  # Insomnia - Hold (Remeron, trazadone, temazepam) due to active GI bleed, consider restarting tomorrow as needed  # DM II - Sensitive SSI  # DVT: SCDs  # Nutrition: NPO  Dispo: Disposition is deferred at this time, awaiting improvement of current medical problems. Anticipated discharge in approximately 1-3 day(s).   The patient does have a current PCP (No primary provider on file.) and does not need an [facility] hospital follow-up appointment after discharge.  The patient does have transportation limitations that hinder transportation to clinic appointments.  Signed: Pleas Koch, MD 03/03/2013, 9:34 PM

## 2013-03-03 NOTE — ED Notes (Signed)
Per EMS- Pt family reporting 2 episodes of bright red blood in stool today. Pt has dementia, pt only able to recall her name which is baseline. Family reports pt has been more altered today than usual. Initial BP 140/82, in route decreased to 98 systolic. Unable to establish IV access. HR 70 SR, CBG 211. No obvious bleeding on stretcher when moving to our bed. Reports pain in abdomen but this is inconsistent.

## 2013-03-03 NOTE — ED Notes (Signed)
Pt assisted to bedside commode with assistance of RN where she had one large loose red and brown stool mixture. Assisted back to bed.

## 2013-03-04 DIAGNOSIS — K5730 Diverticulosis of large intestine without perforation or abscess without bleeding: Secondary | ICD-10-CM

## 2013-03-04 DIAGNOSIS — K922 Gastrointestinal hemorrhage, unspecified: Secondary | ICD-10-CM

## 2013-03-04 DIAGNOSIS — D62 Acute posthemorrhagic anemia: Secondary | ICD-10-CM

## 2013-03-04 LAB — CBC
HCT: 29.3 % — ABNORMAL LOW (ref 36.0–46.0)
HCT: 28.5 % — ABNORMAL LOW (ref 36.0–46.0)
Hemoglobin: 10 g/dL — ABNORMAL LOW (ref 12.0–15.0)
Hemoglobin: 9.9 g/dL — ABNORMAL LOW (ref 12.0–15.0)
MCHC: 34.1 g/dL (ref 30.0–36.0)
MCHC: 34.7 g/dL (ref 30.0–36.0)
MCV: 81.8 fL (ref 78.0–100.0)
MCV: 81.9 fL (ref 78.0–100.0)
RDW: 15.2 % (ref 11.5–15.5)
RDW: 15.3 % (ref 11.5–15.5)
WBC: 8.8 10*3/uL (ref 4.0–10.5)

## 2013-03-04 LAB — COMPREHENSIVE METABOLIC PANEL
ALT: 7 U/L (ref 0–35)
AST: 11 U/L (ref 0–37)
Albumin: 2.8 g/dL — ABNORMAL LOW (ref 3.5–5.2)
Alkaline Phosphatase: 68 U/L (ref 39–117)
Calcium: 8.4 mg/dL (ref 8.4–10.5)
Glucose, Bld: 97 mg/dL (ref 70–99)
Potassium: 3.6 mEq/L (ref 3.5–5.1)
Sodium: 146 mEq/L — ABNORMAL HIGH (ref 135–145)
Total Protein: 5.9 g/dL — ABNORMAL LOW (ref 6.0–8.3)

## 2013-03-04 LAB — MRSA PCR SCREENING: MRSA by PCR: POSITIVE — AB

## 2013-03-04 MED ORDER — TEMAZEPAM 15 MG PO CAPS
15.0000 mg | ORAL_CAPSULE | Freq: Every evening | ORAL | Status: DC | PRN
  Administered 2013-03-04: 15 mg via ORAL
  Filled 2013-03-04: qty 1

## 2013-03-04 MED ORDER — MIRTAZAPINE 15 MG PO TABS
15.0000 mg | ORAL_TABLET | Freq: Every day | ORAL | Status: DC
  Administered 2013-03-04: 15 mg via ORAL
  Filled 2013-03-04 (×2): qty 1

## 2013-03-04 MED ORDER — MUPIROCIN 2 % EX OINT
1.0000 "application " | TOPICAL_OINTMENT | Freq: Two times a day (BID) | CUTANEOUS | Status: DC
  Administered 2013-03-04 – 2013-03-05 (×4): 1 via NASAL
  Filled 2013-03-04: qty 22

## 2013-03-04 MED ORDER — TRAZODONE HCL 50 MG PO TABS
50.0000 mg | ORAL_TABLET | Freq: Every day | ORAL | Status: DC
  Administered 2013-03-04: 50 mg via ORAL
  Filled 2013-03-04 (×2): qty 1

## 2013-03-04 MED ORDER — CHLORHEXIDINE GLUCONATE CLOTH 2 % EX PADS
6.0000 | MEDICATED_PAD | Freq: Every day | CUTANEOUS | Status: DC
  Administered 2013-03-04 – 2013-03-05 (×2): 6 via TOPICAL

## 2013-03-04 MED ORDER — TRIAMCINOLONE ACETONIDE 0.1 % EX CREA
TOPICAL_CREAM | Freq: Every day | CUTANEOUS | Status: DC
  Administered 2013-03-04 – 2013-03-05 (×2): via TOPICAL
  Filled 2013-03-04: qty 15

## 2013-03-04 MED ORDER — PANTOPRAZOLE SODIUM 40 MG PO TBEC
40.0000 mg | DELAYED_RELEASE_TABLET | Freq: Every day | ORAL | Status: DC
  Administered 2013-03-04 – 2013-03-05 (×2): 40 mg via ORAL
  Filled 2013-03-04 (×2): qty 1

## 2013-03-04 NOTE — Progress Notes (Signed)
CRITICAL VALUE ALERT   Critical value received:  Positive MRSA  Date of notification:  03/04/2013   Time of notification: 0141  Critical value read back: yes  Nurse who received alert:  Birdena Crandall RN  MD notified (1st page):  0145  Time of first page:   MD notified (2nd page):  Time of second page:  Responding MD:    Time MD responded: [phone]

## 2013-03-04 NOTE — H&P (Signed)
I saw and evaluated the patient. I personally confirmed the key portions of Dr. Louann Liv history and exam and reviewed pertinent patient test results. The assessment, diagnosis, and plan were reviewed with the housestaff and I agree with the documentation in the resident's note.  [name] presents with BRBPR.  Despite her significant h/o PUD the working diagnosis is a diverticular bleed as a brisk upper GI bleed would have resulted in more hemodynamic instability.  I appreciate GI's input to convert her to oral PPI therapy and transfer her to a general medical ward given the stability of her Hgb and blood pressure.  Will continue to manage with 2 wide bore IVs, an active type and screen, and serial hemoglobins.

## 2013-03-04 NOTE — Consult Note (Signed)
[facility] Consult: 8:36 AM 03/04/2013   Referring Provider:  Dr Rolland Porter attending.  Primary Care Physician:  Not listed.  Rufina Falco MD listed in 2009 Primary Gastroenterologist:  Dr. Arlyce Dice   Reason for Consultation:  Bleeding per  Rectum, hematochezia.   HPI: Emily [name] is a 79 y.o. female.   EGD with duodenal erosions and diffuse diverticulosis on colonoscopy.  She had presented with chest pain, and had scant blood in stool exam but never overtly GI bled.  2002 surgical patch repair of perforated pyloric ulcer. Has alzheimers dementia, CAD with PTCA in 2003. Marland Kitchen  Yesterday afternoon had several bouts of painless hematochezia.  Single incidence of non-bloody emesis.  Hgb was 11 in 2013, it was 9.1 at arrival. She has gotten one unit of blood and Hgb is 9.1.  BUN is normal  Daughter says she c/o some brief burning in her stomach yesterday but generally not complaints.  Eats well.  No melena, no abdominal issues.   Not on any blood thinners, platelet disruptors, not even ASA.  No NSAIds.  But also not on any PPI according to the med list. Protonix drip in place.     Past Medical History  Diagnosis Date  . Diabetes mellitus   . Hypertension   . Alzheimer disease   . Psoriasis   . CAD (coronary artery disease)     Moderate coronary artery disease status post angioplasty to the right coronary artery in 2003 and an ejection fraction 46% on catheterization in 2006.  Marland Kitchen Systolic CHF     EF as low is 16%, but 55% in 2009  . Osteoarthritis   . Perforated ulcer 2002    Perforated pyloric channel ulcer patched by Dr. Lindie Spruce  . Duodenal erosion 2009    EGD by Dr. Arlyce Dice  . Gastric erosion 2009    EGD by Dr. Arlyce Dice    Past Surgical History  Procedure Laterality Date  . Total hip arthroplasty Right 2007    Prior to Admission medications   Medication Sig Start Date End Date Taking? Authorizing Provider  acetaminophen (TYLENOL)  325 MG tablet Take 650 mg by mouth 2 (two) times daily.   Yes Historical Provider, MD  allopurinol (ZYLOPRIM) 300 MG tablet Take 150 mg by mouth daily.    Yes Historical Provider, MD  amLODipine (NORVASC) 10 MG tablet Take 10 mg by mouth daily.    Yes Historical Provider, MD  carvedilol (COREG) 12.5 MG tablet Take 18.75 mg by mouth 2 (two) times daily with a meal.  10/12/10  Yes Mia Creek, MD  cholecalciferol (VITAMIN D) 1000 UNITS tablet Take 1,000 Units by mouth daily.    Yes Historical Provider, MD  donepezil (ARICEPT) 10 MG tablet Take 10 mg by mouth at bedtime.    Yes Historical Provider, MD  furosemide (LASIX) 40 MG tablet Take 40 mg by mouth daily.    Yes Historical Provider, MD  glipiZIDE (GLUCOTROL XL) 2.5 MG 24 hr tablet Take 2.5 mg by mouth daily.    Yes Historical Provider, MD  losartan (COZAAR) 100 MG tablet Take 100 mg by mouth daily.    Yes Historical Provider, MD  mirtazapine (REMERON) 30 MG tablet Take 30 mg by mouth at bedtime.   Yes Historical Provider, MD  potassium chloride SA (KLOR-CON M20) 20 MEQ tablet Take 20 mEq by mouth 2 (two) times daily.  08/02/10  Yes Farley Ly, MD  temazepam (RESTORIL) 30 MG capsule Take 30 mg by mouth  at bedtime. For sleep   Yes Historical Provider, MD  traZODone (DESYREL) 100 MG tablet Take 100 mg by mouth at bedtime.   Yes Historical Provider, MD  triamcinolone cream (KENALOG) 0.1 % Apply 1 application topically every morning. Apply to arms and legs   Yes Historical Provider, MD    Scheduled Meds: . allopurinol  150 mg Oral Daily  . Chlorhexidine Gluconate Cloth  6 each Topical Q0600  . influenza vac split quadrivalent PF  0.5 mL Intramuscular Tomorrow-1000  . insulin aspart  0-9 Units Subcutaneous Q6H  . mupirocin ointment  1 application Nasal BID  . sodium chloride  3 mL Intravenous Q12H  . sodium chloride  3 mL Intravenous Q12H   Infusions: . pantoprozole (PROTONIX) infusion 8 mg/hr (03/04/13 0226)   PRN Meds: sodium  chloride, sodium chloride   Allergies as of 03/03/2013  . (No Known Allergies)    No family history on file.  History   Social History  . Marital Status: widowed    Spouse Name: N/A    Number of Children: N/A  . Years of Education: N/A   Occupational History  . Not on file.   Social History Main Topics  . Smoking status: Never Smoker   . Smokeless tobacco: Not on file  . Alcohol Use: No  . Drug Use: Not on file  . Sexual Activity: Not on file   Other Topics Concern  . Not on file   Social History Narrative  . Lives with her daughter and grandson.     REVIEW OF SYSTEMS: Not able to provide a lot of history. Says she lives at home with her sibllings and her parents, that her mom makes a very good poundcake.  No belly pain.  She is thirsty and her mouth feels dry.   No nose bleeds.  No trouble swallowing food.    PHYSICAL EXAM: Vital signs in last 24 hours: Temp:  [97 F (36.1 C)-98.9 F (37.2 C)] 98.6 F (37 C) (09/15 0744) Pulse Rate:  [58-75] 67 (09/15 0744) Resp:  [12-30] 16 (09/15 0744) BP: (82-116)/(41-73) 115/63 mmHg (09/15 0744) SpO2:  [93 %-100 %] 98 % (09/15 0744) Weight:  [99.5 kg (219 lb 5.7 oz)] 99.5 kg (219 lb 5.7 oz) (09/15 0600)  General: pleasant, NAD.  Looks well. Head:  No trauma, no swelling of face  Eyes:  No icterus or pallor Ears:  Not HOH  Nose:  No congestion or discharge Mouth:  Clear, moist oral MM.  Most teeth gone. Neck:  No JVD or mass Lungs:  Clear.  Non labored breathing Heart: RRR.  No MRG Abdomen:  Soft, no mass.  Not tender or distended.  .   Rectal: deferred.  Yesterday: Dried blood around the anus, hemorrhoid present and not bleeding.     Musc/Skeltl: pes planus on left. Derm:  Extensive toenail onnychymycosis. Very dry skin of LE Extremities:  No pedal or LE edema.  Feet warm  Neurologic:  Oriented to herself and her birthdate but not to current year or the hospital.  Follows commands, alert.  Tattoos:   none Nodes:  No cervical adenopathy.   Psych:  Pleasant, cooperative. Relaxed.   Intake/Output from previous day: 09/14 0701 - 09/15 0700 In: 952.5 [I.V.:140; Blood:712.5; IV Piggyback:100] Out: -  Intake/Output this shift:    LAB RESULTS:  Recent Labs  03/03/13 1950  WBC 6.9  HGB 9.1*  HCT 27.0*  PLT 159   BMET Lab Results  Component Value Date  NA 143 03/03/2013   NA 139 11/16/2011   NA 137 10/31/2011   K 4.0 03/03/2013   K 4.1 11/16/2011   K 4.0 10/31/2011   CL 107 03/03/2013   CL 105 11/16/2011   CL 102 10/31/2011   CO2 26 03/03/2013   CO2 25 11/16/2011   CO2 25 10/31/2011   GLUCOSE 258* 03/03/2013   GLUCOSE 241* 11/16/2011   GLUCOSE 172* 10/31/2011   BUN 22 03/03/2013   BUN 22 11/16/2011   BUN 17 10/31/2011   CREATININE 1.54* 03/03/2013   CREATININE 1.30* 11/16/2011   CREATININE 1.12* 10/31/2011   CALCIUM 8.2* 03/03/2013   CALCIUM 8.9 11/16/2011   CALCIUM 8.8 10/31/2011   LFT No results found for this basename: PROT, ALBUMIN, AST, ALT, ALKPHOS, BILITOT, BILIDIR, IBILI,  in the last 72 hours PT/INR Lab Results  Component Value Date   INR 1.26 03/03/2013    RADIOLOGY STUDIES: No results found.  ENDOSCOPIC STUDIES: 08/17/2007  EGD  By Arlyce Dice  Findings:  Multiple areas of erythema in bulb and stomach with superficial erosions.  Stomach biopsy showed benign gastric antral and body-type mucosa with mild chronic inflammation. No intestinal metaplasia, dysplasia, or malignancy identified. Warthin-Starry stain negative for organisms of Helicobacter pylori.   08/17/2007  Colonoscopy Findings:  Diffuse diverticulosis.   IMPRESSION: *  Acute GI bleed.  By history this sounds diverticular.  BUN is normal, no melena : all count against UGI source *  Hx PUD requiring 2002 surgery and EGD in 2009.  Needs to stay on PPI which appear to have lapsed, not sure she needs PPI drip. *  Dementia. Pleasantly confused. Follows commands. Unable to give reliable history.  *  Anemia,  normocytic.  Transfusion parameters are to transfuse at Hgb levels closer to 8.0 or below.   PLAN: *  Per Dr Marina Goodell. *  Change to po Protonix when the PPI drip finishes current bag.  *  Since she is DNR, does she need stepdown level of care.?   LOS: 1 day   Jennye Moccasin  03/04/2013, 8:36 AM Pager: [phone]  GI ATTENDING  History, laboratories, x-rays, prior endoscopy reports reviewed. Patient personally seen and examined. Granddaughter in room. Patient presents with self-limited painless hematochezia. Probably mild diverticular bleed. Hemodynamically stable. Drifting hemoglobin as noted. No bleeding suggest today. Agree with close observation. Advance diet as tolerated, monitoring stools, and hemoglobin. Transfuse as needed. No indication for endoscopic evaluations at this point. Will follow  Wilhemina Bonito. Eda Keys., M.D. [facility] Division of Gastroenterology

## 2013-03-04 NOTE — Progress Notes (Signed)
Subjective:  Pt appears in no acute distress this morning. She is alert but only oriented to self. According to RN notes she had a couple of loose BM's last evening with associated blood.   Objective: Vital signs in last 24 hours: Filed Vitals:   03/04/13 0600 03/04/13 0744 03/04/13 1200 03/04/13 1550  BP:  115/63 111/68 136/62  Pulse:  67 77 78  Temp:  98.6 F (37 C) 99.3 F (37.4 C) 99.3 F (37.4 C)  TempSrc:  Oral Oral Oral  Resp:  16 19 20   Weight: 99.5 kg (219 lb 5.7 oz)     SpO2:  98% 96% 99%   Weight change:   Intake/Output Summary (Last 24 hours) at 03/04/13 1602 Last data filed at 03/04/13 0900  Gross per 24 hour  Intake 1057.5 ml  Output      0 ml  Net 1057.5 ml   Constitutional: She appears well-developed and well-nourished. No distress.  Cardiovascular: Normal rate, regular rhythm and normal heart sounds. Exam reveals no friction rub.  No murmur heard.  Pulmonary/Chest: Effort normal and breath sounds normal. No respiratory distress. She has no wheezes. She has no rales.  Abdominal: Soft. Bowel sounds are normal. She exhibits no distension. There is no tenderness. There is no rebound and no guarding.  Neurological: She is alert. Oriented only to self. Minimally conversant, slow speech.  Skin: She is not diaphoretic.  Psychiatric: She has a normal mood and affect. Her behavior is normal.    Lab Results: Basic Metabolic Panel:  Recent Labs Lab 03/03/13 1950 03/04/13 0730  NA 143 146*  K 4.0 3.6  CL 107 112  CO2 26 25  GLUCOSE 258* 97  BUN 22 22  CREATININE 1.54* 1.23*  CALCIUM 8.2* 8.4   Liver Function Tests:  Recent Labs Lab 03/04/13 0730  AST 11  ALT 7  ALKPHOS 68  BILITOT 0.6  PROT 5.9*  ALBUMIN 2.8*   No results found for this basename: LIPASE, AMYLASE,  in the last 168 hours No results found for this basename: AMMONIA,  in the last 168 hours CBC:  Recent Labs Lab 03/03/13 1950 03/04/13 0755  WBC 6.9 7.5  NEUTROABS 5.5  --    HGB 9.1* 10.0*  HCT 27.0* 29.3*  MCV 83.9 81.8  PLT 159 137*   Cardiac Enzymes: No results found for this basename: CKTOTAL, CKMB, CKMBINDEX, TROPONINI,  in the last 168 hours BNP: No results found for this basename: PROBNP,  in the last 168 hours D-Dimer: No results found for this basename: DDIMER,  in the last 168 hours CBG:  Recent Labs Lab 03/04/13 0002 03/04/13 0636 03/04/13 1217  GLUCAP 176* 92 89   Hemoglobin A1C: No results found for this basename: HGBA1C,  in the last 168 hours Fasting Lipid Panel: No results found for this basename: CHOL, HDL, LDLCALC, TRIG, CHOLHDL, LDLDIRECT,  in the last 168 hours Thyroid Function Tests: No results found for this basename: TSH, T4TOTAL, FREET4, T3FREE, THYROIDAB,  in the last 168 hours Coagulation:  Recent Labs Lab 03/03/13 1950  LABPROT 15.5*  INR 1.26   Anemia Panel: No results found for this basename: VITAMINB12, FOLATE, FERRITIN, TIBC, IRON, RETICCTPCT,  in the last 168 hours Urine Drug Screen: Drugs of Abuse  No results found for this basename: labopia, cocainscrnur, labbenz, amphetmu, thcu, labbarb    Alcohol Level: No results found for this basename: ETH,  in the last 168 hours Urinalysis: No results found for this basename: COLORURINE,  APPERANCEUR, LABSPEC, PHURINE, GLUCOSEU, HGBUR, BILIRUBINUR, KETONESUR, PROTEINUR, UROBILINOGEN, NITRITE, LEUKOCYTESUR,  in the last 168 hours   Micro Results: Recent Results (from the past 240 hour(s))  MRSA PCR SCREENING     Status: Abnormal   Collection Time    03/03/13 10:49 PM      Result Value Range Status   MRSA by PCR POSITIVE (*) NEGATIVE Final   Comment:            The GeneXpert MRSA Assay (FDA     approved for NASAL specimens     only), is one component of a     comprehensive MRSA colonization     surveillance program. It is not     intended to diagnose MRSA     infection nor to guide or     monitor treatment for     MRSA infections.     RESULT CALLED TO,  READ BACK BY AND VERIFIED WITH:     PETTIFORD,A RN 0139 03/04/13 MITCHELL,L   Studies/Results: No results found. Medications: I have reviewed the patient's current medications. Scheduled Meds: . allopurinol  150 mg Oral Daily  . Chlorhexidine Gluconate Cloth  6 each Topical Q0600  . insulin aspart  0-9 Units Subcutaneous Q6H  . mupirocin ointment  1 application Nasal BID  . pantoprazole  40 mg Oral Q0600  . sodium chloride  3 mL Intravenous Q12H  . sodium chloride  3 mL Intravenous Q12H   Continuous Infusions:  PRN Meds:.sodium chloride, sodium chloride Assessment/Plan: Principal Problem: 1. Acute GI Bleed  The patient has an ongoing acute GI bleed that has caused a decrease in blood pressure. She continues to have bloody bowel movements. While she is not tachycardic, she has taken BB, which may be masking tachycardia. The etiology of the GI bleed in uncertain. It is most likely lower given BRBPR and BUN/Cr ratio of less than 35, but a brisk upper GI bleed is possible. The patient's history of duodenal erosion 2009 and pyloric perforation 2002 are concerning for recurrence as upper GI source. She is known to have diffuse diverticulosis and hemorrhoids which may be likely causes of lower GI bleed. She was given 1 unit of prbc's and her hgb came up to 10.0. GI was consulted and recommended PPI po after the PPI drip. We will get records from [facility]. - PPI drip for now - Per GI, perform technitium scan if patient decompensates overnight, if not will follow in am  # HTN  - hold home anti-hypertensives (lasix, amlodipine, coreg)  # CAD  - Hold BB, ASA  # Dementia  -Continue Aricept  # Insomnia  - Hold (Remeron, trazadone, temazepam) due to active GI bleed, consider restarting tomorrow as needed  # DM II  - Sensitive SSI  # DVT: SCDs  # Nutrition: NPO   Dispo: Disposition is deferred at this time, awaiting improvement of current medical problems.  Anticipated discharge in approximately 1-2  day(s).   The patient does have a current PCP (No primary provider on file.) and does need an [facility] hospital follow-up appointment after discharge.  .Services Needed at time of discharge: Y = Yes, Blank = No PT:   OT:   RN:   Equipment:   Other:     LOS: 1 day   Boykin Peek, MD 03/04/2013, 4:02 PM

## 2013-03-05 DIAGNOSIS — I2510 Atherosclerotic heart disease of native coronary artery without angina pectoris: Secondary | ICD-10-CM

## 2013-03-05 DIAGNOSIS — I10 Essential (primary) hypertension: Secondary | ICD-10-CM

## 2013-03-05 DIAGNOSIS — E119 Type 2 diabetes mellitus without complications: Secondary | ICD-10-CM

## 2013-03-05 LAB — GLUCOSE, CAPILLARY
Glucose-Capillary: 83 mg/dL (ref 70–99)
Glucose-Capillary: 107 mg/dL — ABNORMAL HIGH (ref 70–99)

## 2013-03-05 LAB — TYPE AND SCREEN
ABO/RH(D): O POS
Antibody Screen: NEGATIVE
Unit division: 0
Unit division: 0

## 2013-03-05 LAB — CBC
HCT: 29.3 % — ABNORMAL LOW (ref 36.0–46.0)
Hemoglobin: 10 g/dL — ABNORMAL LOW (ref 12.0–15.0)
MCV: 83 fL (ref 78.0–100.0)
RDW: 15.5 % (ref 11.5–15.5)
WBC: 10.2 10*3/uL (ref 4.0–10.5)

## 2013-03-05 LAB — BASIC METABOLIC PANEL
Chloride: 108 mEq/L (ref 96–112)
GFR calc Af Amer: 54 mL/min — ABNORMAL LOW (ref >90)
GFR calc non Af Amer: 47 mL/min — ABNORMAL LOW (ref >90)
Glucose, Bld: 134 mg/dL — ABNORMAL HIGH (ref 70–99)
Potassium: 4.1 mEq/L (ref 3.5–5.1)
Sodium: 143 mEq/L (ref 135–145)

## 2013-03-05 MED ORDER — TRAZODONE HCL 50 MG PO TABS: 50.0000 mg | ORAL_TABLET | Freq: Every day | ORAL | Status: DC

## 2013-03-05 MED ORDER — TEMAZEPAM 15 MG PO CAPS: 15.0000 mg | ORAL_CAPSULE | Freq: Every evening | ORAL | Status: DC | PRN

## 2013-03-05 MED ORDER — PANTOPRAZOLE SODIUM 40 MG PO TBEC: 40.0000 mg | DELAYED_RELEASE_TABLET | Freq: Every day | ORAL | Status: DC

## 2013-03-05 MED ORDER — INSULIN ASPART 100 UNIT/ML ~~LOC~~ SOLN: 0.0000 [IU] | Freq: Three times a day (TID) | SUBCUTANEOUS | Status: DC

## 2013-03-05 NOTE — Progress Notes (Signed)
     [facility] Daily Rounding Note 03/05/2013, 10:09 AM  SUBJECTIVE:       Brown stool yesterday afternoon.  No blood with this.  Minor amount of blood with otherwisebrown BM this AM. No abdominal pain, no nausea. Eating solid food.  Feels well and ready for discharge home.   OBJECTIVE:         Vital signs in last 24 hours:    Temp:  [97.9 F (36.6 C)-99.3 F (37.4 C)] 98.2 F (36.8 C) (09/16 0804) Pulse Rate:  [62-78] 69 (09/16 0400) Resp:  [13-20] 16 (09/16 0804) BP: (111-144)/(62-95) 137/64 mmHg (09/16 0804) SpO2:  [93 %-100 %] 100 % (09/16 0804) Weight:  [99.5 kg (219 lb 5.7 oz)] 99.5 kg (219 lb 5.7 oz) (09/16 0400) Last BM Date: 03/05/13 General: looks well.  comfortable   Heart: RRR Chest: clear.  No resp distress Abdomen: soft, active BS.  No tenderness or distention  Extremities: no CCE Neuro/Psych:  Pleasant, appropriate but not fully oriented.  Not agitated. Able to have a conversation.   Intake/Output from previous day: 09/15 0701 - 09/16 0700 In: 70 [I.V.:70] Out: 300 [Urine:300]  Intake/Output this shift:    Lab Results:  Recent Labs  03/04/13 0755 03/04/13 1800 03/05/13 0910  WBC 7.5 8.8 10.2  HGB 10.0* 9.9* 10.0*  HCT 29.3* 28.5* 29.3*  PLT 137* 137* 142*   BMET  Recent Labs  03/03/13 1950 03/04/13 0730  NA 143 146*  K 4.0 3.6  CL 107 112  CO2 26 25  GLUCOSE 258* 97  BUN 22 22  CREATININE 1.54* 1.23*  CALCIUM 8.2* 8.4   LFT  Recent Labs  03/04/13 0730  PROT 5.9*  ALBUMIN 2.8*  AST 11  ALT 7  ALKPHOS 68  BILITOT 0.6   PT/INR  Recent Labs  03/03/13 1950  LABPROT 15.5*  INR 1.26    ASSESMENT: * Acute GI bleed. By history this sounds diverticular. BUN is normal, no melena : all count against UGI source. Bleeding mostly resolved  * Hx PUD requiring 2002 surgery and EGD in 2009. PPI therapy lapsed since 07/2007 EGD showing gastric and bulbar erosions.  * Dementia. Pleasantly confused. Follows commands. Unable to give  reliable history.  * Anemia, normocytic. Transfusion parameters are to transfuse at Hgb levels closer to 8.0 or below. Hgb stable since 2 unit PRBC on 9/14. Hgb nadir was 9.1 on 9/14.     PLAN: *  Home later today? Keep pt on once daily PPI at discharge given PUD history.    LOS: 2 days   Jennye Moccasin  03/05/2013, 10:09 AM Pager: [phone]  GI ATTENDING  Patient seen and examined. Agree with interval history and laboratories as outlined above. Patient stable without GI bleeding. Actually had brown stool. No further recommendations from our standpoint. Probably okay to go home.  Wilhemina Bonito. Eda Keys., M.D. [facility] Division of Gastroenterology

## 2013-03-05 NOTE — Progress Notes (Signed)
   I have seen the patient and reviewed the daily progress note by Rawad Hamzi MS 4 and discussed the care of the patient with them.  See below for documentation of my findings, assessment, and plans.  Subjective: The patient is pleasantly demented and doing well this morning. She states that she moved her bowels yesterday and did not have any blood. She slept well and states that her breakfast was fine. No complaints, NO SOB, NO chest pain. No nausea or vomiting or diarrhea.   Objective: Vital signs in last 24 hours: Filed Vitals:   03/05/13 0010 03/05/13 0400 03/05/13 0804 03/05/13 1126  BP: 131/78 144/95 137/64 128/81  Pulse: 62 69  70  Temp: 98.4 F (36.9 C) 97.9 F (36.6 C) 98.2 F (36.8 C) 98.1 F (36.7 C)  TempSrc: Oral Oral Oral Oral  Resp: 18 19 16 20   Weight:  219 lb 5.7 oz (99.5 kg)    SpO2: 93% 100% 100% 100%   Weight change: 0 lb (0 kg)  Intake/Output Summary (Last 24 hours) at 03/05/13 2001 Last data filed at 03/05/13 0300  Gross per 24 hour  Intake      0 ml  Output    300 ml  Net   -300 ml  General: resting in bed, pleasantly demented HEENT: PERRL, EOMI, no scleral icterus Cardiac: RRR, no rubs, murmurs or gallops Pulm: clear to auscultation bilaterally, moving normal volumes of air Abd: soft, nontender, nondistended, BS present Ext: warm and well perfused, no pedal edema Neuro: alert and oriented X3, cranial nerves II-XII grossly intact  Lab Results: Reviewed and documented in Electronic Record Micro Results: Reviewed and documented in Electronic Record Studies/Results: Reviewed and documented in Electronic Record Medications: I have reviewed the patient's current medications. Scheduled Meds: Continuous Infusions: PRN Meds:.   Assessment/Plan:  Acute GI bleeding - Likely diverticular bleed and has stopped. Hg stable for 2 days without signs of active bleeding. Given her age and co-morbidities no further intervention at this time is required.  -Back  to home today -Follow up closely in 2 weeks for follow up  DIABETES MELLITUS, TYPE II - The patient was maintained on SSI while in the hospital and will be discharged on her home medications. No hypoglycemic or hyperglycemic events in the last 24 hours.   HYPERTENSION - Patient's BP was well controlled at 128/81 today and can resume her home meds on discharge.   Dispo: Disposition is deferred at this time, awaiting improvement of current medical problems.  Anticipated discharge in approximately 0 day(s).   The patient does have a current PCP (No primary provider on file.) and does not need an [facility] hospital follow-up appointment after discharge.  The patient does not have transportation limitations that hinder transportation to clinic appointments.  .Services Needed at time of discharge: Y = Yes, Blank = No PT:   OT:   RN:   Equipment:   Other:     LOS: 2 days   Judie Bonus, MD 03/05/2013, 8:01 PM

## 2013-03-05 NOTE — Progress Notes (Signed)
Medical Student Daily Progress Note  Subjective: [name] is doing well this morning. She had one BM yesterday that was well formed, light brown in color, non-bloody, and not associated with any pain. She has not had any abdominal pain, N/V, diarrhea, or any reflux.  She has been tolerating her PO protonix and diet without problems. She has been ambulating and voiding with no difficulties. She feels ready to go home today.   Objective: Vital signs in last 24 hours: Filed Vitals:   03/04/13 2000 03/05/13 0010 03/05/13 0400 03/05/13 0804  BP: 138/69 131/78 144/95 137/64  Pulse: 63 62 69   Temp: 98.7 F (37.1 C) 98.4 F (36.9 C) 97.9 F (36.6 C) 98.2 F (36.8 C)  TempSrc: Oral Oral Oral Oral  Resp: 20 18 19 16   Weight:   99.5 kg (219 lb 5.7 oz)   SpO2: 95% 93% 100% 100%   Weight change: 0 kg (0 lb)  Intake/Output Summary (Last 24 hours) at 03/05/13 0827 Last data filed at 03/05/13 0300  Gross per 24 hour  Intake     35 ml  Output    300 ml  Net   -265 ml   Physical Exam: BP 137/64  Pulse 69  Temp(Src) 98.2 F (36.8 C) (Oral)  Resp 16  Wt 99.5 kg (219 lb 5.7 oz)  BMI 32.38 kg/m2  SpO2 100% General appearance: alert, cooperative and no distress Throat: lips, mucosa, and tongue normal; teeth and gums normal and moist mucous membranes Lungs: clear to auscultation bilaterally Heart: nl S1/S2; II/VI systolic ejection murmur without radiation best appreciated at right second intercostal space Abdomen: soft, non-tender; bowel sounds normal; no masses,  no organomegaly Lab Results: Basic Metabolic Panel:  Recent Labs Lab 03/03/13 1950 03/04/13 0730  NA 143 146*  K 4.0 3.6  CL 107 112  CO2 26 25  GLUCOSE 258* 97  BUN 22 22  CREATININE 1.54* 1.23*  CALCIUM 8.2* 8.4   Liver Function Tests:  Recent Labs Lab 03/04/13 0730  AST 11  ALT 7  ALKPHOS 68  BILITOT 0.6  PROT 5.9*  ALBUMIN 2.8*   CBC:  Recent Labs Lab 03/03/13 1950 03/04/13 0755 03/04/13 1800   WBC 6.9 7.5 8.8  NEUTROABS 5.5  --   --   HGB 9.1* 10.0* 9.9*  HCT 27.0* 29.3* 28.5*  MCV 83.9 81.8 81.9  PLT 159 137* 137*   CBG:  Recent Labs Lab 03/04/13 0636 03/04/13 1217 03/04/13 1721 03/05/13 0015 03/05/13 0643 03/05/13 0757  GLUCAP 92 89 84 114* 83 88   Hemoglobin A1C: No results found for this basename: HGBA1C,  in the last 168 hours Fasting Lipid Panel: No results found for this basename: CHOL, HDL, LDLCALC, TRIG, CHOLHDL, LDLDIRECT,  in the last 168 hours Thyroid Function Tests: No results found for this basename: TSH, T4TOTAL, FREET4, T3FREE, THYROIDAB,  in the last 168 hours Coagulation:  Recent Labs Lab 03/03/13 1950  LABPROT 15.5*  INR 1.26    Micro Results: Recent Results (from the past 240 hour(s))  MRSA PCR SCREENING     Status: Abnormal   Collection Time    03/03/13 10:49 PM      Result Value Range Status   MRSA by PCR POSITIVE (*) NEGATIVE Final   Comment:            The GeneXpert MRSA Assay (FDA     approved for NASAL specimens     only), is one component of a  comprehensive MRSA colonization     surveillance program. It is not     intended to diagnose MRSA     infection nor to guide or     monitor treatment for     MRSA infections.     RESULT CALLED TO, READ BACK BY AND VERIFIED WITH:     PETTIFORD,A RN 0139 03/04/13 MITCHELL,L   Medications: I have reviewed the patient's current medications. Scheduled Meds: . allopurinol  150 mg Oral Daily  . Chlorhexidine Gluconate Cloth  6 each Topical Q0600  . insulin aspart  0-9 Units Subcutaneous TID WC  . mirtazapine  15 mg Oral QHS  . mupirocin ointment  1 application Nasal BID  . pantoprazole  40 mg Oral Q0600  . sodium chloride  3 mL Intravenous Q12H  . sodium chloride  3 mL Intravenous Q12H  . traZODone  50 mg Oral QHS  . triamcinolone cream   Topical Daily   Continuous Infusions:  PRN Meds:.sodium chloride, sodium chloride, temazepam Assessment/Plan: Principal Problem:    Acute GI bleeding Active Problems:   DIABETES MELLITUS, TYPE II   HYPERTENSION   CORONARY ARTERY DISEASE   PEPTIC ULCER DISEASE   DUODENITIS, WITHOUT HEMORRHAGE   DIVERTICULOSIS, COLON   PERCUTANEOUS TRANSLUMINAL CORONARY ANGIOPLASTY, HX OF  **GI Bleeding** It appears the source of her hematochezia is most likely diverticular in origin. Given the association with some mild abdominal pain occurring the day of presentation with hematochezia, this seems most likely. While brisk upper GI bleed cannot be fully ruled out without EGD or tagged RBC study, it is unlikely this caused her hematochezia in the complete absence of any melena, normal BUN. She has not had any further hematochezia and is tolerating her oral diet and PPI well. She has remained afebrile with a normal white count, and diverticulitis would not first present with bleeding, so it is unlikely that she is developing diverticulitis at this time. Her volume status has been stable with good BPs since admission. --Continue PO Protonix --Hb stable, no further transfusions needed   **Microcytic Anemia** Hb yesterday post 1 unit pRBCs was 9.9. Given her history of multiple GI bleeding sources (duodenal erosion, perforated pyloric ulcer, diverticulosis) and other comorbidities (CAD, DM, HLD, PUD, h/o heavy periods in past), likely Anemia of Chronic Disease is a factor, aside from chronic undetected GI bleeding. Her Hb should be kept above 8.0 given her comorbidities, and she seems to be stable without any symptoms of dizziness, dyspnea on exertion, and fatigue. --CBC on hospital f/u    LOS: 2 days   This is a Psychologist, occupational Note.  The care of the patient was discussed with Dr. Dorise Hiss and the assessment and plan formulated with their assistance.  Please see their attached note for official documentation of the daily encounter.  Emily [name] 03/05/2013, 8:27 AM

## 2013-03-05 NOTE — Progress Notes (Signed)
Utilization review completed. Jamyia Fortune, RN, BSN. 

## 2013-03-05 NOTE — Discharge Summary (Signed)
Name: Emily [name] MRN: [id number] DOB: Jan 19, 1931 79 y.o. PCP: No primary provider on file.  Date of Admission: 03/03/2013  6:05 PM Date of Discharge: 03/05/2013 Attending Physician: Inez Catalina, MD  Discharge Diagnosis: Principal Problem:   Acute GI bleeding Active Problems:   DIABETES MELLITUS, TYPE II   HYPERTENSION   CORONARY ARTERY DISEASE   PEPTIC ULCER DISEASE   DUODENITIS, WITHOUT HEMORRHAGE   DIVERTICULOSIS, COLON   PERCUTANEOUS TRANSLUMINAL CORONARY ANGIOPLASTY, HX OF  Discharge Medications:   Medication List         acetaminophen 325 MG tablet  Commonly known as:  TYLENOL  Take 650 mg by mouth 2 (two) times daily.     allopurinol 300 MG tablet  Commonly known as:  ZYLOPRIM  Take 150 mg by mouth daily.     amLODipine 10 MG tablet  Commonly known as:  NORVASC  Take 10 mg by mouth daily.     carvedilol 12.5 MG tablet  Commonly known as:  COREG  Take 18.75 mg by mouth 2 (two) times daily with a meal.     cholecalciferol 1000 UNITS tablet  Commonly known as:  VITAMIN D  Take 1,000 Units by mouth daily.     donepezil 10 MG tablet  Commonly known as:  ARICEPT  Take 10 mg by mouth at bedtime.     furosemide 40 MG tablet  Commonly known as:  LASIX  Take 40 mg by mouth daily.     glipiZIDE 2.5 MG 24 hr tablet  Commonly known as:  GLUCOTROL XL  Take 2.5 mg by mouth daily.     KLOR-CON M20 20 MEQ tablet  Generic drug:  potassium chloride SA  Take 20 mEq by mouth 2 (two) times daily.     losartan 100 MG tablet  Commonly known as:  COZAAR  Take 100 mg by mouth daily.     mirtazapine 30 MG tablet  Commonly known as:  REMERON  Take 30 mg by mouth at bedtime.     temazepam 15 MG capsule  Commonly known as:  RESTORIL  Take 1 capsule (15 mg total) by mouth at bedtime as needed for sleep.     traZODone 50 MG tablet  Commonly known as:  DESYREL  Take 1 tablet (50 mg total) by mouth at bedtime.     triamcinolone cream 0.1 %  Commonly known as:   KENALOG  Apply 1 application topically every morning. Apply to arms and legs        Disposition and follow-up:   Ms.Emily [name] was discharged from [facility] in Good condition.  At the hospital follow up visit please address:  1.  Any further episodes of blood per rectum  2.  Labs / imaging needed at time of follow-up: None  3.  Pending labs/ test needing follow-up: None  Follow-up Appointments:     Follow-up Information   Follow up with [facility]. (Follow up the day after discharge)    Contact information:   [address] [place]  [address] [phone]      Discharge Instructions: Discharge Orders   Future Orders Complete By Expires   Call MD for:  difficulty breathing, headache or visual disturbances  As directed    Diet - low sodium heart healthy  As directed    Increase activity slowly  As directed      Consultations:  Gastroenterology  Procedures Performed: No results found.  2D Echo: None  Cardiac Cath: None  Admission HPI: Emily [name] is a 79 y.o. woman with a pmhx of GI bleed (2009, 2/2 duodenal erosion) (2002 perforated pyloric ulcer patch), DM, CAD s/p angioplasty, HTN, Alzheimer Disease who presents with a cc of GI bleed. The majority of the history was obtained from the patients daughter. The patient was in her normal state of health until this morning when she began to appear more confused than normal. At 4 pm, she went to use the bathroom. The family noticed that there was blood in the toiler and all over the bathroom. The patients underwear was soaked with blood. They called EMS. Since then the patient has had 3 bloody bowel movements since then. Her BP was 140 systolic per EMS, and trended down to 85/55, despite 500 cc NS bolus. Hg was 9.1, down from 11 baseline 1 year ago. She had one episode on non-bloody non-billoious emesis in the ED. Internal medicine was then consulted to admit the patient.   Denies, SOB, CP,  fever, chills, dysuria, abdominal pain.   Hospital Course by problem list: Principal Problem:   Acute GI bleeding-Pt presented to the ED with increased confusion and painless hematochezia. She has a h/o diverticulosis, duodenal ulcer, and perforated pyloric ulcer with graham patch. Pt has Alzheimer's Disease and therefore the history was obtained by family members.  They noticed blood in the toilet bowl when she used the bathroom and in her underpants. They subsequently called EMS and brought her to the ED. She then had 3 bloody BMs. In the ED her BP had trended down to 85/55 despite being given one NS bolus. IM was called to admit the patient. Initially her hemoglobin was 9.1. Etiologies that were considered include: diverticulosis, AVM, hemorrhoids or a brisk upper GI bleed. She was given 2 units of prbcs with a transfusion threshold of >8 and to use NS to maintain a MAP>60. She was started on a protonix drip and transitioned to po protonix for a possible upper GI bleed. GI was consulted. She continued to have several bloody BMs in the hospital but did not require additional blood. Her hemoglobin level was followed closely and never fell below the initial 9.1 level. Her hemoglobin today on discharge was 10.0 and her BP has been stable and upon discharge was 128/81. She had one small formed BM this morning with a few drops of blood in it.   GI was consulted and followed the patient closely throughout her hospital stay. They advised switching to po protonix. Their diagnosis was hematochezia due to a mild diverticular bleed and did not recommend any endoscopic evaluation at this point.   Patient should be monitored for recurrent bleeding from diverticula as she is more prone to additional bleeds. That being said, most diverticular bleeding will stop spontaneously without intervention.    Active Problems:   DIABETES MELLITUS, TYPE II-Stable. Continue current regimen.   HYPERTENSION-Stable. Continue  home medications.   CORONARY ARTERY DISEASE-Stable.    PEPTIC ULCER DISEASE-Stable.    DUODENITIS, WITHOUT HEMORRHAGE-Stable.   DIVERTICULOSIS, COLON-Stable. Continue to monitor for bleeding.    PERCUTANEOUS TRANSLUMINAL CORONARY ANGIOPLASTY, HX OF-Stable.     Discharge Vitals:   BP 128/81  Pulse 70  Temp(Src) 98.1 F (36.7 C) (Oral)  Resp 20  Wt 99.5 kg (219 lb 5.7 oz)  BMI 32.38 kg/m2  SpO2 100%  Discharge Labs:  Results for orders placed during the hospital encounter of 03/03/13 (from the past 24 hour(s))  GLUCOSE, CAPILLARY  Status: None   Collection Time    03/04/13  5:21 PM      Result Value Range   Glucose-Capillary 84  70 - 99 mg/dL  CBC     Status: Abnormal   Collection Time    03/04/13  6:00 PM      Result Value Range   WBC 8.8  4.0 - 10.5 K/uL   RBC 3.48 (*) 3.87 - 5.11 MIL/uL   Hemoglobin 9.9 (*) 12.0 - 15.0 g/dL   HCT 16.1 (*) 09.6 - 04.5 %   MCV 81.9  78.0 - 100.0 fL   MCH 28.4  26.0 - 34.0 pg   MCHC 34.7  30.0 - 36.0 g/dL   RDW 40.9  81.1 - 91.4 %   Platelets 137 (*) 150 - 400 K/uL  GLUCOSE, CAPILLARY     Status: Abnormal   Collection Time    03/05/13 12:15 AM      Result Value Range   Glucose-Capillary 114 (*) 70 - 99 mg/dL   Comment 1 Notify RN    GLUCOSE, CAPILLARY     Status: None   Collection Time    03/05/13  6:43 AM      Result Value Range   Glucose-Capillary 83  70 - 99 mg/dL  GLUCOSE, CAPILLARY     Status: None   Collection Time    03/05/13  7:57 AM      Result Value Range   Glucose-Capillary 88  70 - 99 mg/dL  CBC     Status: Abnormal   Collection Time    03/05/13  9:10 AM      Result Value Range   WBC 10.2  4.0 - 10.5 K/uL   RBC 3.53 (*) 3.87 - 5.11 MIL/uL   Hemoglobin 10.0 (*) 12.0 - 15.0 g/dL   HCT 78.2 (*) 95.6 - 21.3 %   MCV 83.0  78.0 - 100.0 fL   MCH 28.3  26.0 - 34.0 pg   MCHC 34.1  30.0 - 36.0 g/dL   RDW 08.6  57.8 - 46.9 %   Platelets 142 (*) 150 - 400 K/uL  BASIC METABOLIC PANEL     Status: Abnormal    Collection Time    03/05/13  9:10 AM      Result Value Range   Sodium 143  135 - 145 mEq/L   Potassium 4.1  3.5 - 5.1 mEq/L   Chloride 108  96 - 112 mEq/L   CO2 25  19 - 32 mEq/L   Glucose, Bld 134 (*) 70 - 99 mg/dL   BUN 14  6 - 23 mg/dL   Creatinine, Ser 6.29  0.50 - 1.10 mg/dL   Calcium 8.7  8.4 - 52.8 mg/dL   GFR calc non Af Amer 47 (*) >90 mL/min   GFR calc Af Amer 54 (*) >90 mL/min  GLUCOSE, CAPILLARY     Status: Abnormal   Collection Time    03/05/13 12:05 PM      Result Value Range   Glucose-Capillary 107 (*) 70 - 99 mg/dL    Signed: Boykin Peek, MD 03/05/2013, 2:20 PM   Time Spent on Discharge: 45 minutes Services Ordered on Discharge: None Equipment Ordered on Discharge: None

## 2013-03-05 NOTE — Progress Notes (Signed)
  Date: 03/05/2013  Patient name: Emily [name]  Medical record number: [id number]  Date of birth: [birth year]   This patient has been seen and the plan of care was discussed with the house staff. Please see their note for complete details. I concur with their findings with the following additions/corrections:  [name] is appropriate for discharge today.  She has had no further bloody bowel movements by report and her H/H are stabilized.  I reviewed the MS 4 Hamzi's note  Inez Catalina, MD 03/05/2013, 4:16 PM

## 2013-03-06 NOTE — ED Provider Notes (Signed)
I saw and evaluated the patient, reviewed the resident's note and I agree with the findings and plan. I personally evaluated this patient. I discussed the patient at length with the resident Dr. Modesto Charon. The patient appears pale. Hemoglobin is low. She is undergoing crossmatch and transfusion. Her abdomen is benign without pain. Her respirations are being made for transfusion emergency room. She was hypotensive at the pressure 80 resolved with fluid bolus. Transfusion is being begun in the emergency room. Her blood pressures have improved. Her range be made for admission to the floor.  CRITICAL CARE Performed by: Rolland Porter JOSEPH   Total critical care time: 35 minutes  Critical care was involved in arranging for an initiating blood transfusion emergently of patient's hemodynamic lay significant lower GI bleed. She has responded well to the above therapy.  Critical care time was exclusive of separately billable procedures and treating other patients.  Critical care was necessary to treat or prevent imminent or life-threatening deterioration.  Critical care was time spent personally by me on the following activities: development of treatment plan with patient and/or surrogate as well as nursing, discussions with consultants, evaluation of patient's response to treatment, examination of patient, obtaining history from patient or surrogate, ordering and performing treatments and interventions, ordering and review of laboratory studies, ordering and review of radiographic studies, pulse oximetry and re-evaluation of patient's condition.   Claudean Kinds, MD 03/06/13 [phone]

## 2013-03-11 NOTE — Discharge Summary (Signed)
I saw Emily [name] on day of discharge and assisted with the formulated plan.

## 2015-01-01 ENCOUNTER — Emergency Department (HOSPITAL_COMMUNITY)
Admission: EM | Admit: 2015-01-01 | Discharge: 2015-01-01 | Disposition: A | Discharge status: Home / Self Care | Payer: Medicare (Managed Care) | Attending: Emergency Medicine | Admitting: Emergency Medicine

## 2015-01-01 DIAGNOSIS — Z8719 Personal history of other diseases of the digestive system: Secondary | ICD-10-CM | POA: Insufficient documentation

## 2015-01-01 DIAGNOSIS — Z79899 Other long term (current) drug therapy: Secondary | ICD-10-CM | POA: Diagnosis not present

## 2015-01-01 DIAGNOSIS — F4489 Other dissociative and conversion disorders: Secondary | ICD-10-CM

## 2015-01-01 DIAGNOSIS — I2510 Atherosclerotic heart disease of native coronary artery without angina pectoris: Secondary | ICD-10-CM | POA: Diagnosis not present

## 2015-01-01 DIAGNOSIS — G309 Alzheimer's disease, unspecified: Secondary | ICD-10-CM | POA: Insufficient documentation

## 2015-01-01 DIAGNOSIS — R4182 Altered mental status, unspecified: Secondary | ICD-10-CM | POA: Diagnosis present

## 2015-01-01 DIAGNOSIS — Z872 Personal history of diseases of the skin and subcutaneous tissue: Secondary | ICD-10-CM | POA: Diagnosis not present

## 2015-01-01 DIAGNOSIS — I5020 Unspecified systolic (congestive) heart failure: Secondary | ICD-10-CM | POA: Insufficient documentation

## 2015-01-01 DIAGNOSIS — M1990 Unspecified osteoarthritis, unspecified site: Secondary | ICD-10-CM | POA: Insufficient documentation

## 2015-01-01 DIAGNOSIS — I10 Essential (primary) hypertension: Secondary | ICD-10-CM | POA: Diagnosis not present

## 2015-01-01 DIAGNOSIS — E119 Type 2 diabetes mellitus without complications: Secondary | ICD-10-CM | POA: Insufficient documentation

## 2015-01-01 DIAGNOSIS — R410 Disorientation, unspecified: Secondary | ICD-10-CM | POA: Diagnosis not present

## 2015-01-01 LAB — COMPREHENSIVE METABOLIC PANEL
ALT: 12 U/L — ABNORMAL LOW (ref 14–54)
AST: 17 U/L (ref 15–41)
Albumin: 3.4 g/dL — ABNORMAL LOW (ref 3.5–5.0)
Alkaline Phosphatase: 85 U/L (ref 38–126)
Anion gap: 11 (ref 5–15)
BUN: 10 mg/dL (ref 6–20)
CO2: 28 mmol/L (ref 22–32)
Calcium: 9.1 mg/dL (ref 8.9–10.3)
Chloride: 105 mmol/L (ref 101–111)
Creatinine, Ser: 1.08 mg/dL — ABNORMAL HIGH (ref 0.44–1.00)
GFR calc Af Amer: 54 mL/min — ABNORMAL LOW (ref >60)
GFR calc non Af Amer: 46 mL/min — ABNORMAL LOW (ref >60)
Glucose, Bld: 123 mg/dL — ABNORMAL HIGH (ref 65–99)
Potassium: 4.3 mmol/L (ref 3.5–5.1)
Sodium: 144 mmol/L (ref 135–145)
Total Bilirubin: 0.6 mg/dL (ref 0.3–1.2)
Total Protein: 7.2 g/dL (ref 6.5–8.1)

## 2015-01-01 LAB — CBC
HCT: 36.5 % (ref 36.0–46.0)
Hemoglobin: 12 g/dL (ref 12.0–15.0)
MCH: 28 pg (ref 26.0–34.0)
MCHC: 32.9 g/dL (ref 30.0–36.0)
MCV: 85.3 fL (ref 78.0–100.0)
Platelets: 190 10*3/uL (ref 150–400)
RBC: 4.28 MIL/uL (ref 3.87–5.11)
RDW: 14.5 % (ref 11.5–15.5)
WBC: 4 10*3/uL (ref 4.0–10.5)

## 2015-01-01 LAB — URINALYSIS, ROUTINE W REFLEX MICROSCOPIC
BILIRUBIN URINE: NEGATIVE
GLUCOSE, UA: NEGATIVE mg/dL
HGB URINE DIPSTICK: NEGATIVE
KETONES UR: NEGATIVE mg/dL
LEUKOCYTES UA: NEGATIVE
Nitrite: NEGATIVE
Protein, ur: NEGATIVE mg/dL
Specific Gravity, Urine: 1.014 (ref 1.005–1.030)
Urobilinogen, UA: 0.2 mg/dL (ref 0.0–1.0)
pH: 7.5 (ref 5.0–8.0)

## 2015-01-01 LAB — CBG MONITORING, ED: Glucose-Capillary: 128 mg/dL — ABNORMAL HIGH (ref 65–99)

## 2015-01-01 LAB — TROPONIN I: Troponin I: <0.03 ng/mL (ref <0.031)

## 2015-01-01 NOTE — ED Notes (Signed)
MD at bedside. 

## 2015-01-01 NOTE — Discharge Instructions (Signed)
Confusion Confusion is the inability to think with your usual speed or clarity. Confusion may come on quickly or slowly over time. How quickly the confusion comes on depends on the cause. Confusion can be due to any number of causes. CAUSES   Concussion, head injury, or head trauma.  Seizures.  Stroke.  Fever.  Brain tumor.  Age related decreased brain function (dementia).  Heightened emotional states like rage or terror.  Mental illness in which the person loses the ability to determine what is real and what is not (hallucinations).  Infections such as a urinary tract infection (UTI).  Toxic effects from alcohol, drugs, or prescription medicines.  Dehydration and an imbalance of salts in the body (electrolytes).  Lack of sleep.  Low blood sugar (diabetes).  Low levels of oxygen from conditions such as chronic lung disorders.  Drug interactions or other medicine side effects.  Nutritional deficiencies, especially niacin, thiamine, vitamin C, or vitamin B.  Sudden drop in body temperature (hypothermia).  Change in routine, such as when traveling or hospitalized. SIGNS AND SYMPTOMS  People often describe their thinking as cloudy or unclear when they are confused. Confusion can also include feeling disoriented. That means you are unaware of where or who you are. You may also not know what the date or time is. If confused, you may also have difficulty paying attention, remembering, and making decisions. Some people also act aggressively when they are confused.  DIAGNOSIS  The medical evaluation of confusion may include:  Blood and urine tests.  X-rays.  Brain and nervous system tests.  Analyzing your brain waves (electroencephalogram or EEG).  Magnetic resonance imaging (MRI) of your head.  Computed tomography (CT) scan of your head.  Mental status tests in which your health care provider may ask many questions. Some of these questions may seem silly or  strange, but they are a very important test to help diagnose and treat confusion. TREATMENT  An admission to the hospital may not be needed, but a person with confusion should not be left alone. Stay with a family member or friend until the confusion clears. Avoid alcohol, pain relievers, or sedative drugs until you have fully recovered. Do not drive until directed by your health care provider. HOME CARE INSTRUCTIONS  What family and friends can do:  To find out if someone is confused, ask the person to state his or her name, age, and the date. If the person is unsure or answers incorrectly, he or she is confused.  Always introduce yourself, no matter how well the person knows you.  Often remind the person of his or her location.  Place a calendar and clock near the confused person.  Help the person with his or her medicines. You may want to use a pill box, an alarm as a reminder, or give the person each dose as prescribed.  Talk about current events and plans for the day.  Try to keep the environment calm, quiet, and peaceful.  Make sure the person keeps follow-up visits with his or her health care provider. PREVENTION  Ways to prevent confusion:  Avoid alcohol.  Eat a balanced diet.  Get enough sleep.  Take medicine only as directed by your health care provider.  Do not become isolated. Spend time with other people and make plans for your days.  Keep careful watch on your blood sugar levels if you are diabetic. SEEK IMMEDIATE MEDICAL CARE IF:   You develop severe headaches, repeated vomiting, seizures, blackouts,  or slurred speech.  There is increasing confusion, weakness, numbness, restlessness, or personality changes.  You develop a loss of balance, have marked dizziness, feel uncoordinated, or fall.  You have delusions, hallucinations, or develop severe anxiety.  Your family members think you need to be rechecked. Document Released: 07/14/2004 Document Revised:  10/21/2013 Document Reviewed: 07/12/2013 [facility] Patient Information 2015 [place], [place]. This information is not intended to replace advice given to you by your health care provider. Make sure you discuss any questions you have with your health care provider.  Alzheimer Disease Caregiver Guide Alzheimer disease is an illness that affects a person's brain. It causes a person to lose the ability to remember things and make good decisions. As the disease progresses, the person is unable to take care of himself or herself and needs more and more help to do simple tasks. Taking care of someone with Alzheimer disease can be very challenging and overwhelming.  MEMORY LOSS AND CONFUSION Memory loss and confusion is mild in the beginning stages of the disease. Both of these problems become more severe as the disease progresses. Eventually, the person will not recognize places or even close family members and friends.   Stay calm.  Respond with a short explanation. Long explanations can be overwhelming and confusing.  Avoid corrections that sound like scolding.  Try not to take it personally, even if the person forgets your name. BEHAVIOR CHANGES Behavior changes are part of the disease. The person may develop depression, anxiety, anger, hallucinations, or other behavior changes. These changes can come on suddenly and may be in response to pain, infection, changes in the environment (temperature, noise), overstimulation, or feeling lost or scared.   Try not to take behavior changes personally.  Remain calm and patient.  Do not argue or try to convince the person about a specific point. This will only make him or her more agitated.  Know that the behavior changes are part of the disease process and try to work through it. TIPS TO REDUCE FRUSTRATION  Schedule wisely by making appointments and doing daily tasks, like bathing and dressing, when the person is at his or her best.  Take your time.  Simple tasks may take a lot longer, so be sure to allow for plenty of time.  Limit choices. Too many choices can be overwhelming and stressful for the person.  Involve the person in what you are doing.  Stick to a routine.  Avoid new or crowded situations, if possible.  Use simple words, short sentences, and a calm voice. Only give one direction at a time.  Buy clothes and shoes that are easy to put on and take off.  Let people help if they offer. HOME SAFETY Keeping the home safe is very important to reduce the risk of falls and injuries.   Keep floors clear of clutter. Remove rugs, magazine racks, and floor lamps.  Keep hallways well lit.  Put a handrail and nonslip mat in the bathtub or shower.  Put childproof locks on cabinets with dangerous items, such as medicine, alcohol, guns, toxic cleaning items, sharp tools or utensils, matches, or lighters.  Place locks on doors where the person cannot easily see or reach them. This helps ensure that the person cannot wander out of the house and get lost.  Be prepared for emergencies. Keep a list of emergency phone numbers and addresses in a convenient area. PLANS FOR THE FUTURE  Do not put off talking about finances.  Talk about money  management. People with Alzheimer disease have trouble managing their money as the disease gets worse.  Get help from professional advisors regarding financial and legal matters.  Do not put off talking about future care.  Choose a power of attorney. This is someone who can make decisions for the person with Alzheimer disease when he or she is no longer able to do so.  Talk about driving and when it is the right time to stop. The person's health care provider can help give advice on this matter.  Talk about the person's living situation. If he or she lives alone, you need to make sure he or she is safe. Some people need extra help at home, and others need more care at a nursing home or care  center. SUPPORT GROUPS Joining a support group can be very helpful for caregivers of people with Alzheimer disease. Some advantages to being part of a support group include:   Getting strategies to manage stress.  Sharing experiences with others.  Receiving emotional comfort and support.  Learning new caregiving skills as the disease progresses.  Knowing what community resources are available and taking advantage of them. SEEK MEDICAL CARE IF:  The person has a fever.  The person has a sudden change in behavior that does not improve with calming strategies.  The person is unable to manage in his or her current living situation.  The person threatens you or anyone else, including himself or herself.  You are no longer able to care for the person. Document Released: 02/16/2004 Document Revised: 10/21/2013 Document Reviewed: 07/13/2011 [facility] Patient Information 2015 [place], [place]. This information is not intended to replace advice given to you by your health care provider. Make sure you discuss any questions you have with your health care provider.

## 2015-01-01 NOTE — ED Provider Notes (Signed)
CSN: [id number]     Arrival date & time 01/01/15  9562 History   First MD Initiated Contact with Patient 01/01/15 0840     Chief Complaint  Patient presents with  . Altered Mental Status     (Consider location/radiation/quality/duration/timing/severity/associated sxs/prior Treatment) HPI Patient is an 81 year old female with past medical history of diabetes, Alzheimer's disease, psoriasis, CAD, CHF who presents the ER by EMS. Patient's family reports that this morning when attempting to wake patient up she was difficult to wake up, had a period of combativeness immediately after waking up. Patient's family state after approximately 10-15 minutes this episode resolves completely and patient returned to her baseline. They had her transported to the ER for further evaluation. During interview, patient denies having any complaints. Family reports currently the patient is at her baseline. Patient does have some mild confusion, and family states that is normal for her.  Past Medical History  Diagnosis Date  . Diabetes mellitus   . Hypertension   . Alzheimer disease   . Psoriasis   . CAD (coronary artery disease)     Moderate coronary artery disease status post angioplasty to the right coronary artery in 2003 and an ejection fraction 46% on catheterization in 2006.  Marland Kitchen Systolic CHF     EF as low is 45%, but 55% in 2009  . Osteoarthritis   . Perforated ulcer 2002    Perforated pyloric channel ulcer patched by Dr. [name]  . Duodenal erosion 2009    EGD by Dr. [name]  . Gastric erosion 2009    EGD by Dr. [name]   Past Surgical History  Procedure Laterality Date  . Total hip arthroplasty Right 2007   No family history on file. History  Substance Use Topics  . Smoking status: Never Smoker   . Smokeless tobacco: Not on file  . Alcohol Use: No   OB History    No data available     Review of Systems  Unable to perform ROS: Dementia      Allergies  Review of patient's allergies  indicates no known allergies.  Home Medications   Prior to Admission medications   Medication Sig Start Date End Date Taking? Authorizing Provider  acetaminophen (TYLENOL) 650 MG CR tablet Take 650 mg by mouth 2 (two) times daily.   Yes Historical Provider, MD  allopurinol (ZYLOPRIM) 300 MG tablet Take 150 mg by mouth daily.    Yes Historical Provider, MD  amLODipine (NORVASC) 10 MG tablet Take 10 mg by mouth daily.    Yes Historical Provider, MD  carvedilol (COREG) 12.5 MG tablet Take 18.75 mg by mouth 2 (two) times daily with a meal.  10/12/10  Yes [name], MD  cholecalciferol (VITAMIN D) 1000 UNITS tablet Take 1,000 Units by mouth daily.    Yes Historical Provider, MD  donepezil (ARICEPT) 10 MG tablet Take 10 mg by mouth at bedtime.    Yes Historical Provider, MD  furosemide (LASIX) 40 MG tablet Take 40 mg by mouth daily.    Yes Historical Provider, MD  losartan (COZAAR) 100 MG tablet Take 100 mg by mouth daily.    Yes Historical Provider, MD  mirtazapine (REMERON) 30 MG tablet Take 30 mg by mouth at bedtime.   Yes Historical Provider, MD  temazepam (RESTORIL) 30 MG capsule Take 30 mg by mouth at bedtime.   Yes Historical Provider, MD  traMADol (ULTRAM) 50 MG tablet Take 50 mg by mouth 2 (two) times daily.   Yes Historical  Provider, MD  traZODone (DESYREL) 100 MG tablet Take 100 mg by mouth at bedtime.   Yes Historical Provider, MD  triamcinolone cream (KENALOG) 0.1 % Apply 1 application topically every morning. Apply to arms and legs   Yes Historical Provider, MD   BP 120/93 mmHg  Pulse 77  Temp(Src) 98.6 F (37 C) (Oral)  Resp 16  Wt 235 lb (106.595 kg)  SpO2 97% Physical Exam  Constitutional: She is oriented to person, place, and time. She appears well-developed and well-nourished. No distress.  HENT:  Head: Normocephalic and atraumatic.  Mouth/Throat: Oropharynx is clear and moist. No oropharyngeal exudate.  Eyes: Right eye exhibits no discharge. Left eye exhibits no  discharge. No scleral icterus.  Neck: Normal range of motion.  Cardiovascular: Normal rate, regular rhythm and normal heart sounds.   No murmur heard. Pulmonary/Chest: Effort normal and breath sounds normal. No respiratory distress.  Abdominal: Soft. There is no tenderness.  Musculoskeletal: Normal range of motion. She exhibits no edema or tenderness.  Neurological: She is alert and oriented to person, place, and time. She has normal strength. No cranial nerve deficit or sensory deficit. She displays a negative Romberg sign. Coordination and gait normal. GCS eye subscore is 4. GCS verbal subscore is 5. GCS motor subscore is 6.  Patient fully alert, answering questions appropriately in full, clear sentences. Mild confusion noted to event only. Cranial nerves II through XII grossly intact. Motor strength 5 out of 5 in all major muscle groups of upper and lower extremities. Distal sensation intact. Patient able to ambulate well with assistance. This is her normal ambulation state.   Skin: Skin is warm and dry. No rash noted. She is not diaphoretic.  Psychiatric: She has a normal mood and affect.  Nursing note and vitals reviewed.   ED Course  Procedures (including critical care time) Labs Review Labs Reviewed  COMPREHENSIVE METABOLIC PANEL - Abnormal; Notable for the following:    Glucose, Bld 123 (*)    Creatinine, Ser 1.08 (*)    Albumin 3.4 (*)    ALT 12 (*)    GFR calc non Af Amer 46 (*)    GFR calc Af Amer 54 (*)    All other components within normal limits  URINALYSIS, ROUTINE W REFLEX MICROSCOPIC (NOT AT [facility]) - Abnormal; Notable for the following:    APPearance CLOUDY (*)    All other components within normal limits  CBG MONITORING, ED - Abnormal; Notable for the following:    Glucose-Capillary 128 (*)    All other components within normal limits  CBC  TROPONIN I    Imaging Review No results found.   EKG Interpretation   Date/Time:  Thursday January 01 2015 08:19:12  EDT Ventricular Rate:  63 PR Interval:  176 QRS Duration: 101 QT Interval:  436 QTC Calculation: 446 R Axis:   0 Text Interpretation:  Sinus rhythm Abnormal R-wave progression, early  transition Left ventricular hypertrophy Nonspecific T abnrm, anterolateral  leads since last tracing no significant change Confirmed by MILLER  MD,  BRIAN ([id number]) on 01/01/2015 9:36:38 AM      MDM   Final diagnoses:  Confusion state   Patient here with an episode of confusion this morning which lasted approximately 10 minutes. This was immediately after patient was awoken from sleep. Patient does have history of Alzheimer's dementia. Family reported patient is back at baseline immediately after this event, and still at baseline now. Patient denies having any complaints during her stay in the  ER. Screening labs ordered, do not appear to have evidence of acute pathology. Doubt etiology of this episode was CVA or TIA in nature. Do not see any evidence of acute pathology based on patient's examination or lab evaluation here today. No concern for sepsis or SIRS. No altered mental status or neurologic deficit on multiple examinations. Patient hemodynamically stable and in no acute distress. Patient stable for discharge. Encouraged follow with PCP, patient and her family verbalizes understanding and agreement this plan.  BP 120/93 mmHg  Pulse 77  Temp(Src) 98.6 F (37 C) (Oral)  Resp 16  Wt 235 lb (106.595 kg)  SpO2 97%  Signed,  Dahlia Bailiff, PA-C 4:41 PM  Patient seen and discussed with Dr. Virgel Manifold, MD    Dahlia Bailiff, PA-C 01/01/15 [place], MD 01/08/15 [phone]

## 2015-01-01 NOTE — ED Notes (Signed)
Pt presents via EMS. Per family pt was unable to wake up per family. Per fire pt had labored respiration. Per EMS pt was conscious and alert X 4 and baseline per family.

## 2015-01-01 NOTE — ED Notes (Signed)
Pt is in stable condition upon d/c and is escorted from ED via wheelchair. 

## 2015-01-01 NOTE — ED Notes (Signed)
Checked patient blood sugar it was 128 notified RN of blood sugar

## 2015-03-25 ENCOUNTER — Encounter (HOSPITAL_COMMUNITY): Payer: Self-pay

## 2015-03-25 ENCOUNTER — Emergency Department (HOSPITAL_COMMUNITY): Payer: Medicare (Managed Care)

## 2015-03-25 ENCOUNTER — Emergency Department (HOSPITAL_COMMUNITY)
Admission: EM | Admit: 2015-03-25 | Discharge: 2015-03-25 | Disposition: A | Discharge status: Home / Self Care | Payer: Medicare (Managed Care) | Attending: Emergency Medicine | Admitting: Emergency Medicine

## 2015-03-25 DIAGNOSIS — G4710 Hypersomnia, unspecified: Secondary | ICD-10-CM | POA: Insufficient documentation

## 2015-03-25 DIAGNOSIS — I5020 Unspecified systolic (congestive) heart failure: Secondary | ICD-10-CM | POA: Diagnosis not present

## 2015-03-25 DIAGNOSIS — E119 Type 2 diabetes mellitus without complications: Secondary | ICD-10-CM | POA: Diagnosis not present

## 2015-03-25 DIAGNOSIS — Z79899 Other long term (current) drug therapy: Secondary | ICD-10-CM | POA: Diagnosis not present

## 2015-03-25 DIAGNOSIS — G309 Alzheimer's disease, unspecified: Secondary | ICD-10-CM | POA: Diagnosis not present

## 2015-03-25 DIAGNOSIS — R4182 Altered mental status, unspecified: Secondary | ICD-10-CM | POA: Diagnosis present

## 2015-03-25 DIAGNOSIS — I10 Essential (primary) hypertension: Secondary | ICD-10-CM | POA: Insufficient documentation

## 2015-03-25 LAB — COMPREHENSIVE METABOLIC PANEL
ALT: 12 U/L — ABNORMAL LOW (ref 14–54)
AST: 19 U/L (ref 15–41)
Albumin: 3.3 g/dL — ABNORMAL LOW (ref 3.5–5.0)
Alkaline Phosphatase: 87 U/L (ref 38–126)
Anion gap: 8 (ref 5–15)
BILIRUBIN TOTAL: 0.6 mg/dL (ref 0.3–1.2)
BUN: 10 mg/dL (ref 6–20)
CO2: 28 mmol/L (ref 22–32)
Calcium: 9.3 mg/dL (ref 8.9–10.3)
Chloride: 105 mmol/L (ref 101–111)
Creatinine, Ser: 1.09 mg/dL — ABNORMAL HIGH (ref 0.44–1.00)
GFR calc Af Amer: 53 mL/min — ABNORMAL LOW (ref >60)
GFR, EST NON AFRICAN AMERICAN: 46 mL/min — AB (ref >60)
Glucose, Bld: 116 mg/dL — ABNORMAL HIGH (ref 65–99)
Potassium: 4.3 mmol/L (ref 3.5–5.1)
Sodium: 141 mmol/L (ref 135–145)
TOTAL PROTEIN: 7 g/dL (ref 6.5–8.1)

## 2015-03-25 LAB — I-STAT VENOUS BLOOD GAS, ED
Acid-Base Excess: 4 mmol/L — ABNORMAL HIGH (ref 0.0–2.0)
Bicarbonate: 31 mEq/L — ABNORMAL HIGH (ref 20.0–24.0)
O2 SAT: 75 %
PCO2 VEN: 53.8 mmHg — AB (ref 45.0–50.0)
PH VEN: 7.368 — AB (ref 7.250–7.300)
PO2 VEN: 42 mmHg (ref 30.0–45.0)
TCO2: 33 mmol/L (ref 0–100)

## 2015-03-25 LAB — CBC
HEMATOCRIT: 36.1 % (ref 36.0–46.0)
Hemoglobin: 11.6 g/dL — ABNORMAL LOW (ref 12.0–15.0)
MCH: 28 pg (ref 26.0–34.0)
MCHC: 32.1 g/dL (ref 30.0–36.0)
MCV: 87.2 fL (ref 78.0–100.0)
Platelets: 184 10*3/uL (ref 150–400)
RBC: 4.14 MIL/uL (ref 3.87–5.11)
RDW: 14.5 % (ref 11.5–15.5)
WBC: 3.6 10*3/uL — AB (ref 4.0–10.5)

## 2015-03-25 LAB — URINALYSIS, ROUTINE W REFLEX MICROSCOPIC
Bilirubin Urine: NEGATIVE
Glucose, UA: NEGATIVE mg/dL
Hgb urine dipstick: NEGATIVE
Ketones, ur: NEGATIVE mg/dL
LEUKOCYTES UA: NEGATIVE
NITRITE: NEGATIVE
PROTEIN: NEGATIVE mg/dL
Specific Gravity, Urine: 1.014 (ref 1.005–1.030)
Urobilinogen, UA: 0.2 mg/dL (ref 0.0–1.0)
pH: 6.5 (ref 5.0–8.0)

## 2015-03-25 LAB — CBG MONITORING, ED: Glucose-Capillary: 106 mg/dL — ABNORMAL HIGH (ref 65–99)

## 2015-03-25 NOTE — ED Notes (Signed)
CBG = 106  Hayden RN informed of result.

## 2015-03-25 NOTE — ED Provider Notes (Signed)
CSN: [id number]     Arrival date & time 03/25/15  0806 History   First MD Initiated Contact with Patient 03/25/15 [phone]     Chief Complaint  Patient presents with  . Altered Mental Status     (Consider location/radiation/quality/duration/timing/severity/associated sxs/prior Treatment) HPI Comments: 81 year old female with past mental history including Alzheimer's dementia, psoriasis, CAD, systolic CHF, type 2 diabetes mellitus, hypertension, PUD who presents with altered mental status. History obtained with the assistance of the patient's daughters as well as EMS. Daughters report that the patient was difficult to arouse from sleep this morning. She was reportedly up at 5:30 AM and went to the bathroom normally but then at 7 AM when they tried to wake her up she would not wake up. She remains difficult to arouse until EMS arrived. She briefly seemed to have a decreased respiratory rate, responsive only to pain but patient quickly became more responsive and was at baseline LOC during transport. Family states that she is currently at her mental baseline. They state that she has had similar episodes in the past including once last week, always when she wakes up in the morning. They do endorse snoring at night and occasional periods when she stops breathing. She has been well recently with no fevers, cough/cold symptoms, or complaints of pain.  Patient is a 81 y.o. female presenting with altered mental status. The history is provided by a relative and the EMS personnel.  Altered Mental Status   Past Medical History  Diagnosis Date  . Diabetes mellitus   . Hypertension   . Alzheimer disease   . Psoriasis   . CAD (coronary artery disease)     Moderate coronary artery disease status post angioplasty to the right coronary artery in 2003 and an ejection fraction 46% on catheterization in 2006.  Marland Kitchen Systolic CHF ([place])     EF as low is 45%, but 55% in 2009  . Osteoarthritis   . Perforated ulcer ([place])  2002    Perforated pyloric channel ulcer patched by Dr. [name]  . Duodenal erosion 2009    EGD by Dr. [name]  . Gastric erosion 2009    EGD by Dr. [name]   Past Surgical History  Procedure Laterality Date  . Total hip arthroplasty Right 2007   No family history on file. Social History  Substance Use Topics  . Smoking status: Never Smoker   . Smokeless tobacco: None  . Alcohol Use: No   OB History    No data available     Review of Systems 10 Systems reviewed and are negative for acute change except as noted in the HPI.   Allergies  Review of patient's allergies indicates no known allergies.  Home Medications   Prior to Admission medications   Medication Sig Start Date End Date Taking? Authorizing Provider  acetaminophen (TYLENOL) 650 MG CR tablet Take 650 mg by mouth 2 (two) times daily.   Yes Historical Provider, MD  allopurinol (ZYLOPRIM) 300 MG tablet Take 150 mg by mouth daily.    Yes Historical Provider, MD  amLODipine (NORVASC) 10 MG tablet Take 10 mg by mouth daily.    Yes Historical Provider, MD  carvedilol (COREG) 12.5 MG tablet Take 18.75 mg by mouth 2 (two) times daily with a meal.  10/12/10  Yes [name], MD  cholecalciferol (VITAMIN D) 1000 UNITS tablet Take 1,000 Units by mouth daily.    Yes Historical Provider, MD  donepezil (ARICEPT) 10 MG tablet Take 10 mg by  mouth at bedtime.    Yes Historical Provider, MD  furosemide (LASIX) 40 MG tablet Take 40 mg by mouth daily.    Yes Historical Provider, MD  losartan (COZAAR) 100 MG tablet Take 100 mg by mouth daily.    Yes Historical Provider, MD  mirtazapine (REMERON) 30 MG tablet Take 30 mg by mouth at bedtime.   Yes Historical Provider, MD  temazepam (RESTORIL) 30 MG capsule Take 30 mg by mouth at bedtime.   Yes Historical Provider, MD  traMADol (ULTRAM) 50 MG tablet Take 50 mg by mouth 2 (two) times daily.   Yes Historical Provider, MD  traZODone (DESYREL) 100 MG tablet Take 100 mg by mouth at bedtime.   Yes  Historical Provider, MD  triamcinolone cream (KENALOG) 0.1 % Apply 1 application topically every morning. Apply to arms and legs   Yes Historical Provider, MD   BP 155/96 mmHg  Pulse 66  Temp(Src) 98 F (36.7 C) (Oral)  Resp 23  SpO2 100% Physical Exam  Constitutional: She appears well-developed and well-nourished. No distress.  Elderly female, conversant and pleasant  HENT:  Head: Normocephalic and atraumatic.  Mouth/Throat: Oropharynx is clear and moist.  Moist mucous membranes  Eyes: Conjunctivae are normal. Pupils are equal, round, and reactive to light.  Neck: Neck supple.  Cardiovascular: Normal rate, regular rhythm and normal heart sounds.   No murmur heard. Pulmonary/Chest: Effort normal and breath sounds normal. No respiratory distress. She has no wheezes.  Abdominal: Soft. Bowel sounds are normal. She exhibits no distension. There is no tenderness.  Musculoskeletal: She exhibits no edema.  Neurological: She is alert. She displays normal reflexes. No cranial nerve deficit. She exhibits normal muscle tone.  Oriented to person and place, follows commands, 5/5 strength x all 4 ext  Skin: Skin is warm and dry.  Scaling psoriatic plaques on chest, arms, back  Psychiatric: She has a normal mood and affect.  Nursing note and vitals reviewed.   ED Course  Procedures (including critical care time) Labs Review Labs Reviewed  COMPREHENSIVE METABOLIC PANEL - Abnormal; Notable for the following:    Glucose, Bld 116 (*)    Creatinine, Ser 1.09 (*)    Albumin 3.3 (*)    ALT 12 (*)    GFR calc non Af Amer 46 (*)    GFR calc Af Amer 53 (*)    All other components within normal limits  CBC - Abnormal; Notable for the following:    WBC 3.6 (*)    Hemoglobin 11.6 (*)    All other components within normal limits  CBG MONITORING, ED - Abnormal; Notable for the following:    Glucose-Capillary 106 (*)    All other components within normal limits  I-STAT VENOUS BLOOD GAS, ED -  Abnormal; Notable for the following:    pH, Ven 7.368 (*)    pCO2, Ven 53.8 (*)    Bicarbonate 31.0 (*)    Acid-Base Excess 4.0 (*)    All other components within normal limits  URINALYSIS, ROUTINE W REFLEX MICROSCOPIC (NOT AT [facility])    Imaging Review Ct Head Wo Contrast  03/25/2015   CLINICAL DATA:  Difficult to arouse this morning by family, decreased respiratory rate, responsive only to pain, altered mental status, similar episode last week, hypertension, diabetes mellitus, Alzheimer's  EXAM: CT HEAD WITHOUT CONTRAST  TECHNIQUE: Contiguous axial images were obtained from the base of the skull through the vertex without intravenous contrast.  COMPARISON:  None  FINDINGS: Generalized atrophy.  Normal ventricular morphology.  No midline shift or mass effect.  Small vessel chronic ischemic changes of deep cerebral white matter.  Calcified mass at floor of RIGHT anterior cranial fossa, 20 x 15 x ~26 mm, likely a calcified meningioma.  No intracranial hemorrhage, additional mass lesion, or acute infarction.  No extra-axial fluid collections.  Visualized paranasal sinuses and mastoid air cells clear.  Bones unremarkable.  Atherosclerotic calcification of internal carotid and vertebral arteries at skullbase.  IMPRESSION: Atrophy with small vessel chronic ischemic changes of deep cerebral white matter.  Calcified mass at floor of RIGHT anterior cranial fossa 20 x 15 x ~26 mm, likely a calcified meningioma.  No acute intracranial abnormalities.   Electronically Signed   By: Lavonia Dana M.D.   On: 03/25/2015 10:31   I have personally reviewed and evaluated these lab results as part of my medical decision-making.   EKG Interpretation   Date/Time:  Wednesday March 25 2015 08:15:01 EDT Ventricular Rate:  63 PR Interval:  176 QRS Duration: 101 QT Interval:  446 QTC Calculation: 457 R Axis:   4 Text Interpretation:  Sinus rhythm Abnormal R-wave progression, early  transition Left ventricular  hypertrophy Nonspecific T abnrm, anterolateral  leads No significant change since last tracing Confirmed by Draeden Kellman MD,  Hatsumi Steinhart ([id number]) on 03/25/2015 8:24:49 AM      MDM   Final diagnoses:  Excessive sleepiness   hypercarbia Alzheimer's dementia  81 year old female with past medical history including dementia who was difficult to arouse according to family members to try to wake her up this morning. Patient returned to baseline mental status during transport and arrived awake, alert, and able to follow commands. Vital signs stable. Patient denying any complaints. No obvious neurologic deficits on exam, patient oriented to person and place. Obtained basic labs listed above as well as CT of head to evaluate for acute intracranial process.  Labs including UA, CMP, CBC unremarkable. Obtained VBG to evaluate for hypercarbia; pH 7.37 with CO2 54. Head CT with chronic ischemic changes but no acute intracranial process. On reexamination, the patient remains well-appearing with no complaints and family states that she has continued to be at her baseline. Given her hypercarbia on lab work and family reports of snoring, excessive daytime sleepiness, I suspect that the patient may have undiagnosed sleep apnea and may be hypercarbic first thing in the morning. TIA/CVA seems less likely given that the same complaint has happened multiple times in the past and patient has always returned to baseline after being awake for a while. As instructed daughter to schedule an follow-up appointment with PCP this week for further evaluation and possible referral for sleep study. I've reviewed return precautions including sustained altered mental status, fever, or neurologic deficits. Family has voiced understanding of plan and patient discharged in satisfactory condition.    Sharlett Iles, MD 03/25/15 [phone]

## 2015-03-25 NOTE — ED Notes (Signed)
Pt. Presents from home following period of being difficult to arouse this AM by family. EMS initially noted pt. To have decreased respiratory rate, CBG 102. Pt. Only responsive to pain. When EMS transport arrived pt. Was Alert and at baseline. Pt. With dementia at baseline per family, only baseline oriented to self. Family reports pt. Had similar situation last week.

## 2015-03-25 NOTE — ED Notes (Signed)
Pt placed in gown and in bed. Pt monitored by pulse ox, bp cuff, and 12-lead. 

## 2015-07-28 ENCOUNTER — Encounter (HOSPITAL_COMMUNITY): Payer: Self-pay

## 2015-07-28 ENCOUNTER — Emergency Department (HOSPITAL_COMMUNITY)
Admission: EM | Admit: 2015-07-28 | Discharge: 2015-07-29 | Disposition: A | Discharge status: Home / Self Care | Payer: Medicare Other | Attending: Emergency Medicine | Admitting: Emergency Medicine

## 2015-07-28 DIAGNOSIS — I10 Essential (primary) hypertension: Secondary | ICD-10-CM | POA: Insufficient documentation

## 2015-07-28 DIAGNOSIS — G309 Alzheimer's disease, unspecified: Secondary | ICD-10-CM | POA: Insufficient documentation

## 2015-07-28 DIAGNOSIS — I5020 Unspecified systolic (congestive) heart failure: Secondary | ICD-10-CM | POA: Insufficient documentation

## 2015-07-28 DIAGNOSIS — G478 Other sleep disorders: Secondary | ICD-10-CM | POA: Diagnosis not present

## 2015-07-28 DIAGNOSIS — I2510 Atherosclerotic heart disease of native coronary artery without angina pectoris: Secondary | ICD-10-CM | POA: Diagnosis not present

## 2015-07-28 DIAGNOSIS — E119 Type 2 diabetes mellitus without complications: Secondary | ICD-10-CM | POA: Insufficient documentation

## 2015-07-28 DIAGNOSIS — Z79899 Other long term (current) drug therapy: Secondary | ICD-10-CM | POA: Diagnosis not present

## 2015-07-28 DIAGNOSIS — L409 Psoriasis, unspecified: Secondary | ICD-10-CM | POA: Diagnosis not present

## 2015-07-28 DIAGNOSIS — F0280 Dementia in other diseases classified elsewhere without behavioral disturbance: Secondary | ICD-10-CM | POA: Insufficient documentation

## 2015-07-28 DIAGNOSIS — Z8719 Personal history of other diseases of the digestive system: Secondary | ICD-10-CM | POA: Diagnosis not present

## 2015-07-28 DIAGNOSIS — R4182 Altered mental status, unspecified: Secondary | ICD-10-CM | POA: Diagnosis present

## 2015-07-28 DIAGNOSIS — G479 Sleep disorder, unspecified: Secondary | ICD-10-CM

## 2015-07-28 NOTE — ED Provider Notes (Signed)
CSN: [id number]     Arrival date & time 07/28/15  2318 History  By signing my name below, I, Soijett Blue, attest that this documentation has been prepared under the direction and in the presence of Rolland Porter, MD at 2322. Electronically Signed: Soijett Blue, ED Scribe. 07/28/2015. 11:37 PM.   Chief Complaint  Patient presents with  . Altered Mental Status  .  LEVEL 5 CAVEAT: DEMENTIA    The history is provided by the EMS personnel and the nursing home. No language interpreter was used.    Emily [name] is a 82 y.o. female with a medical hx of Alzheimer disease, CAD, DM, HTN, CAD, systolic CHF, who presents to the Emergency Department via EMS. Pt came from [facility], a memory care facility. Per EMS: Staff noticed that the pt fell asleep in a chair in the activity room and when they attempted to arouse her with verbal and tactile stimuli they were unable too. Fire department attempted a firm sternal rub for the pt as well to which the pt didn't respond. EMS reports that the pt responded to an ammonia capsule following the sternal run. EMS reports that the pt was lethargic after being aroused. Staff at the facility stated that this is unusual to have to wake the pt like tonight. Staff denies there being a change in the pt medications at this time. Staff reports that the pt has been complaining of worsening RUE and RLE arthritis as well. Denies any other symptoms. EMS reports her CBG was 131 and they were told she could be combative.    Past Medical History  Diagnosis Date  . Diabetes mellitus   . Hypertension   . Alzheimer disease   . Psoriasis   . CAD (coronary artery disease)     Moderate coronary artery disease status post angioplasty to the right coronary artery in 2003 and an ejection fraction 46% on catheterization in 2006.  Marland Kitchen Systolic CHF ([place])     EF as low is 45%, but 55% in 2009  . Osteoarthritis   . Perforated ulcer ([place]) 2002    Perforated pyloric channel ulcer patched by  Dr. [name]  . Duodenal erosion 2009    EGD by Dr. [name]  . Gastric erosion 2009    EGD by Dr. [name]   Past Surgical History  Procedure Laterality Date  . Total hip arthroplasty Right 2007   No family history on file. Social History  Substance Use Topics  . Smoking status: Never Smoker   . Smokeless tobacco: None  . Alcohol Use: No   Lives in memory care unit  OB History    No data available     Review of Systems  Unable to perform ROS: Dementia      Allergies  Review of patient's allergies indicates no known allergies.  Home Medications   Prior to Admission medications   Medication Sig Start Date End Date Taking? Authorizing Provider  acetaminophen (TYLENOL) 325 MG tablet Take 1,300 mg by mouth every morning.   Yes Historical Provider, MD  acetaminophen (TYLENOL) 500 MG tablet Take 500 mg by mouth every 4 (four) hours as needed for mild pain, fever or headache.   Yes Historical Provider, MD  allopurinol (ZYLOPRIM) 300 MG tablet Take 150 mg by mouth daily.    Yes Historical Provider, MD  alum & mag hydroxide-simeth (MINTOX) 200-200-20 MG/5ML suspension Take 30 mLs by mouth every 6 (six) hours as needed for indigestion or heartburn.   Yes Historical  Provider, MD  amLODipine (NORVASC) 10 MG tablet Take 10 mg by mouth daily.    Yes Historical Provider, MD  ammonium lactate (AMLACTIN) 12 % cream Apply 1 g topically 2 (two) times daily.   Yes Historical Provider, MD  bag balm OINT ointment Apply 1 application topically 2 (two) times daily.   Yes Historical Provider, MD  carvedilol (COREG) 12.5 MG tablet Take 18.75 mg by mouth 2 (two) times daily with a meal.  10/12/10  Yes [name], MD  cetirizine (ZYRTEC) 10 MG tablet Take 10 mg by mouth daily.   Yes Historical Provider, MD  cholecalciferol (VITAMIN D) 1000 UNITS tablet Take 1,000 Units by mouth daily.    Yes Historical Provider, MD  divalproex (DEPAKOTE) 125 MG DR tablet Take 125 mg by mouth 3 (three) times daily.    Yes Historical Provider, MD  donepezil (ARICEPT) 10 MG tablet Take 10 mg by mouth at bedtime.    Yes Historical Provider, MD  furosemide (LASIX) 40 MG tablet Take 40 mg by mouth daily.    Yes Historical Provider, MD  guaifenesin (ROBITUSSIN) 100 MG/5ML syrup Take 200 mg by mouth every 6 (six) hours as needed for cough.   Yes Historical Provider, MD  hydrocerin (EUCERIN) CREA Apply 1 application topically daily.   Yes Historical Provider, MD  loperamide (IMODIUM) 2 MG capsule Take 2 mg by mouth daily as needed for diarrhea or loose stools.   Yes Historical Provider, MD  LORazepam (ATIVAN) 0.5 MG tablet Take 0.5 mg by mouth at bedtime.   Yes Historical Provider, MD  losartan (COZAAR) 100 MG tablet Take 100 mg by mouth daily.    Yes Historical Provider, MD  magnesium hydroxide (MILK OF MAGNESIA) 400 MG/5ML suspension Take 30 mLs by mouth at bedtime as needed for mild constipation.   Yes Historical Provider, MD  memantine (NAMENDA XR) 7 MG CP24 24 hr capsule Take 7 mg by mouth daily.   Yes Historical Provider, MD  Menthol, Topical Analgesic, (BIOFREEZE EX) Apply 1 application topically 2 (two) times daily.   Yes Historical Provider, MD  mirtazapine (REMERON) 30 MG tablet Take 15 mg by mouth at bedtime.    Yes Historical Provider, MD  Pramoxine-Calamine (AVEENO ANTI-ITCH EX) Apply 1 application topically 3 (three) times daily.   Yes Historical Provider, MD  traMADol (ULTRAM) 50 MG tablet Take 50 mg by mouth 2 (two) times daily.   Yes Historical Provider, MD  traZODone (DESYREL) 100 MG tablet Take 150 mg by mouth at bedtime.    Yes Historical Provider, MD   BP 187/87 mmHg  Pulse 65  Temp(Src) 98.5 F (36.9 C)  Resp 13  Ht 6\' 2"  (1.88 m)  Wt 235 lb (106.595 kg)  BMI 30.16 kg/m2  SpO2 99%  Vital signs normal except for hypertension  Physical Exam  Constitutional: She appears well-developed and well-nourished.  Non-toxic appearance. She does not appear ill. No distress.  Pt was initially very  cooperative. She is awake when I enter the room. She states she feels fine.  HENT:  Head: Normocephalic and atraumatic.  Right Ear: External ear normal.  Left Ear: External ear normal.  Nose: Nose normal. No mucosal edema or rhinorrhea.  Mouth/Throat: Oropharynx is clear and moist and mucous membranes are normal. No dental abscesses or uvula swelling.  Eyes: Conjunctivae and EOM are normal. Pupils are equal, round, and reactive to light.  Neck: Normal range of motion and full passive range of motion without pain. Neck supple.  Cardiovascular: Normal rate, regular rhythm and normal heart sounds.  Exam reveals no gallop and no friction rub.   No murmur heard. Pulmonary/Chest: Effort normal and breath sounds normal. No respiratory distress. She has no wheezes. She has no rhonchi. She has no rales. She exhibits no tenderness and no crepitus.  Abdominal: Soft. Normal appearance and bowel sounds are normal. She exhibits no distension. There is no tenderness. There is no rebound and no guarding.  Musculoskeletal: Normal range of motion. She exhibits no edema or tenderness.  Moves all extremities well. LE: thickened hyperpigmented skin on bilateral LE. Some areas have some cracking without active bleeding or signs of infection.   Neurological: She is alert. She has normal strength. No cranial nerve deficit.  Pt is cooperative, move all extremities.   Skin: Skin is warm, dry and intact. No rash noted. No erythema. No pallor.  Multiple over most of her body consisted with psoriasis.   Psychiatric: She has a normal mood and affect. Her speech is normal and behavior is normal. Her mood appears not anxious.  Nursing note and vitals reviewed.   ED Course  Procedures (including critical care time) DIAGNOSTIC STUDIES: Oxygen Saturation is 100% on RA, nl by my interpretation.    COORDINATION OF CARE: 11:35 PM will check labs including urine to look for any potential problem.   At time of discharge  patient's daughter is here. She states patient had similar episodes when she was living at home with her daughter where she would be very difficult to awaken for prolonged period of time. She relates it usually happens after she has been sleeping all night. She states her mother is back to her baseline. We discussed her laboratory results and patient is being discharged back to her facility.     Results for orders placed or performed during the hospital encounter of 07/28/15  Urinalysis, Routine w reflex microscopic  Result Value Ref Range   Color, Urine YELLOW YELLOW   APPearance CLEAR CLEAR   Specific Gravity, Urine 1.016 1.005 - 1.030   pH 6.0 5.0 - 8.0   Glucose, UA NEGATIVE NEGATIVE mg/dL   Hgb urine dipstick NEGATIVE NEGATIVE   Bilirubin Urine NEGATIVE NEGATIVE   Ketones, ur NEGATIVE NEGATIVE mg/dL   Protein, ur NEGATIVE NEGATIVE mg/dL   Nitrite NEGATIVE NEGATIVE   Leukocytes, UA NEGATIVE NEGATIVE  Comprehensive metabolic panel  Result Value Ref Range   Sodium 144 135 - 145 mmol/L   Potassium 3.9 3.5 - 5.1 mmol/L   Chloride 106 101 - 111 mmol/L   CO2 28 22 - 32 mmol/L   Glucose, Bld 128 (H) 65 - 99 mg/dL   BUN 12 6 - 20 mg/dL   Creatinine, Ser 1.05 (H) 0.44 - 1.00 mg/dL   Calcium 9.2 8.9 - 10.3 mg/dL   Total Protein 7.7 6.5 - 8.1 g/dL   Albumin 3.2 (L) 3.5 - 5.0 g/dL   AST 13 (L) 15 - 41 U/L   ALT 10 (L) 14 - 54 U/L   Alkaline Phosphatase 89 38 - 126 U/L   Total Bilirubin 0.5 0.3 - 1.2 mg/dL   GFR calc non Af Amer 48 (L) >60 mL/min   GFR calc Af Amer 56 (L) >60 mL/min   Anion gap 10 5 - 15  CBC with Differential  Result Value Ref Range   WBC 5.1 4.0 - 10.5 K/uL   RBC 3.98 3.87 - 5.11 MIL/uL   Hemoglobin 11.1 (L) 12.0 - 15.0 g/dL   HCT  33.7 (L) 36.0 - 46.0 %   MCV 84.7 78.0 - 100.0 fL   MCH 27.9 26.0 - 34.0 pg   MCHC 32.9 30.0 - 36.0 g/dL   RDW 13.9 11.5 - 15.5 %   Platelets 257 150 - 400 K/uL   Neutrophils Relative % 62 %   Neutro Abs 3.2 1.7 - 7.7 K/uL    Lymphocytes Relative 30 %   Lymphs Abs 1.5 0.7 - 4.0 K/uL   Monocytes Relative 6 %   Monocytes Absolute 0.3 0.1 - 1.0 K/uL   Eosinophils Relative 2 %   Eosinophils Absolute 0.1 0.0 - 0.7 K/uL   Basophils Relative 0 %   Basophils Absolute 0.0 0.0 - 0.1 K/uL  Troponin I  Result Value Ref Range   Troponin I <0.03 <0.031 ng/mL   Laboratory interpretation all normal except mild anemia, mild renal insufficiency    I have personally reviewed and evaluated these images and lab results as part of my medical decision-making.   EKG Interpretation   Date/Time:  Wednesday July 29 2015 00:47:13 EST Ventricular Rate:  60 PR Interval:  174 QRS Duration: 105 QT Interval:  455 QTC Calculation: 455 R Axis:   15 Text Interpretation:  Sinus rhythm Atrial premature complex LVH with  secondary repolarization abnormality Baseline wander in lead(s) III No  significant change since last tracing 25 Mar 2015 Confirmed by [facility]   MD-I, Beanca Kiester ([id number]) on 07/29/2015 2:19:16 AM      MDM   Final diagnoses:  Sleep disturbance   Plan discharge  Rolland Porter, MD, FACEP   I personally performed the services described in this documentation, which was scribed in my presence. The recorded information has been reviewed and considered.   Rolland Porter, MD, Barbette Or, MD 07/29/15 [phone]

## 2015-07-28 NOTE — ED Notes (Signed)
Pt arouses to verbal stimuli

## 2015-07-28 NOTE — ED Notes (Addendum)
Pt comes from [facility] via [facility], pt was non arousable for staff when they tried to awaken her, she did not respond to sternal rub but  to ammonia capsule. Staff and family requested evaluation. No recent changes in medications.

## 2015-07-29 DIAGNOSIS — G478 Other sleep disorders: Secondary | ICD-10-CM | POA: Diagnosis not present

## 2015-07-29 LAB — URINALYSIS, ROUTINE W REFLEX MICROSCOPIC
Bilirubin Urine: NEGATIVE
GLUCOSE, UA: NEGATIVE mg/dL
HGB URINE DIPSTICK: NEGATIVE
Ketones, ur: NEGATIVE mg/dL
Leukocytes, UA: NEGATIVE
Nitrite: NEGATIVE
Protein, ur: NEGATIVE mg/dL
SPECIFIC GRAVITY, URINE: 1.016 (ref 1.005–1.030)
pH: 6 (ref 5.0–8.0)

## 2015-07-29 LAB — CBC WITH DIFFERENTIAL/PLATELET
BASOS ABS: 0 10*3/uL (ref 0.0–0.1)
BASOS PCT: 0 %
EOS ABS: 0.1 10*3/uL (ref 0.0–0.7)
EOS PCT: 2 %
HCT: 33.7 % — ABNORMAL LOW (ref 36.0–46.0)
HEMOGLOBIN: 11.1 g/dL — AB (ref 12.0–15.0)
Lymphocytes Relative: 30 %
Lymphs Abs: 1.5 10*3/uL (ref 0.7–4.0)
MCH: 27.9 pg (ref 26.0–34.0)
MCHC: 32.9 g/dL (ref 30.0–36.0)
MCV: 84.7 fL (ref 78.0–100.0)
MONO ABS: 0.3 10*3/uL (ref 0.1–1.0)
MONOS PCT: 6 %
NEUTROS PCT: 62 %
Neutro Abs: 3.2 10*3/uL (ref 1.7–7.7)
Platelets: 257 10*3/uL (ref 150–400)
RBC: 3.98 MIL/uL (ref 3.87–5.11)
RDW: 13.9 % (ref 11.5–15.5)
WBC: 5.1 10*3/uL (ref 4.0–10.5)

## 2015-07-29 LAB — COMPREHENSIVE METABOLIC PANEL
ALT: 10 U/L — AB (ref 14–54)
AST: 13 U/L — ABNORMAL LOW (ref 15–41)
Albumin: 3.2 g/dL — ABNORMAL LOW (ref 3.5–5.0)
Alkaline Phosphatase: 89 U/L (ref 38–126)
Anion gap: 10 (ref 5–15)
BUN: 12 mg/dL (ref 6–20)
CALCIUM: 9.2 mg/dL (ref 8.9–10.3)
CO2: 28 mmol/L (ref 22–32)
Chloride: 106 mmol/L (ref 101–111)
Creatinine, Ser: 1.05 mg/dL — ABNORMAL HIGH (ref 0.44–1.00)
GFR calc non Af Amer: 48 mL/min — ABNORMAL LOW (ref >60)
GFR, EST AFRICAN AMERICAN: 56 mL/min — AB (ref >60)
Glucose, Bld: 128 mg/dL — ABNORMAL HIGH (ref 65–99)
Potassium: 3.9 mmol/L (ref 3.5–5.1)
SODIUM: 144 mmol/L (ref 135–145)
Total Bilirubin: 0.5 mg/dL (ref 0.3–1.2)
Total Protein: 7.7 g/dL (ref 6.5–8.1)

## 2015-07-29 LAB — TROPONIN I

## 2015-07-29 NOTE — Discharge Instructions (Signed)
Her labs tonight are normal, no urinary tract infection. Her daughter states she can be very hard to awaken and it can take a long while and had this behavior when she was living at home. She is back to her baseline per her daughter.

## 2015-07-29 NOTE — ED Notes (Signed)
Pt refuses d/c vitals, pt fighting

## 2015-07-29 NOTE — ED Notes (Signed)
PTAR Called @ [phone].

## 2015-07-30 LAB — URINE CULTURE: Culture: 1000

## 2015-09-10 ENCOUNTER — Encounter (HOSPITAL_COMMUNITY): Payer: Self-pay

## 2015-09-10 ENCOUNTER — Emergency Department (HOSPITAL_COMMUNITY)
Admission: EM | Admit: 2015-09-10 | Discharge: 2015-09-10 | Disposition: A | Discharge status: Home / Self Care | Payer: Medicare Other | Attending: Emergency Medicine | Admitting: Emergency Medicine

## 2015-09-10 DIAGNOSIS — S0990XA Unspecified injury of head, initial encounter: Secondary | ICD-10-CM

## 2015-09-10 DIAGNOSIS — Z8719 Personal history of other diseases of the digestive system: Secondary | ICD-10-CM | POA: Insufficient documentation

## 2015-09-10 DIAGNOSIS — G309 Alzheimer's disease, unspecified: Secondary | ICD-10-CM | POA: Insufficient documentation

## 2015-09-10 DIAGNOSIS — Y9389 Activity, other specified: Secondary | ICD-10-CM | POA: Diagnosis not present

## 2015-09-10 DIAGNOSIS — M1990 Unspecified osteoarthritis, unspecified site: Secondary | ICD-10-CM | POA: Diagnosis not present

## 2015-09-10 DIAGNOSIS — I10 Essential (primary) hypertension: Secondary | ICD-10-CM | POA: Insufficient documentation

## 2015-09-10 DIAGNOSIS — W01198A Fall on same level from slipping, tripping and stumbling with subsequent striking against other object, initial encounter: Secondary | ICD-10-CM | POA: Diagnosis not present

## 2015-09-10 DIAGNOSIS — I5020 Unspecified systolic (congestive) heart failure: Secondary | ICD-10-CM | POA: Insufficient documentation

## 2015-09-10 DIAGNOSIS — Z872 Personal history of diseases of the skin and subcutaneous tissue: Secondary | ICD-10-CM | POA: Insufficient documentation

## 2015-09-10 DIAGNOSIS — Y998 Other external cause status: Secondary | ICD-10-CM | POA: Insufficient documentation

## 2015-09-10 DIAGNOSIS — Y92128 Other place in nursing home as the place of occurrence of the external cause: Secondary | ICD-10-CM | POA: Diagnosis not present

## 2015-09-10 DIAGNOSIS — W19XXXA Unspecified fall, initial encounter: Secondary | ICD-10-CM

## 2015-09-10 DIAGNOSIS — I2510 Atherosclerotic heart disease of native coronary artery without angina pectoris: Secondary | ICD-10-CM | POA: Diagnosis not present

## 2015-09-10 DIAGNOSIS — E119 Type 2 diabetes mellitus without complications: Secondary | ICD-10-CM | POA: Diagnosis not present

## 2015-09-10 DIAGNOSIS — F0280 Dementia in other diseases classified elsewhere without behavioral disturbance: Secondary | ICD-10-CM | POA: Insufficient documentation

## 2015-09-10 NOTE — ED Provider Notes (Signed)
CSN: [id number]     Arrival date & time 09/10/15  1253 History   First MD Initiated Contact with Patient 09/10/15 1312     Chief Complaint  Patient presents with  . Fall     Level V caveat: Dementia  HPI Patient presents to the emergency Department after minor head injury today at the nursing facility when she tripped and fell. She is not on anticoagulation. She complains of no headache at this time. She has dementia and cannot provided additional information about the fall.   Past Medical History  Diagnosis Date  . Diabetes mellitus   . Hypertension   . Alzheimer disease   . Psoriasis   . CAD (coronary artery disease)     Moderate coronary artery disease status post angioplasty to the right coronary artery in 2003 and an ejection fraction 46% on catheterization in 2006.  Marland Kitchen Systolic CHF ([place])     EF as low is 45%, but 55% in 2009  . Osteoarthritis   . Perforated ulcer ([place]) 2002    Perforated pyloric channel ulcer patched by Dr. [name]  . Duodenal erosion 2009    EGD by Dr. [name]  . Gastric erosion 2009    EGD by Dr. [name]   Past Surgical History  Procedure Laterality Date  . Total hip arthroplasty Right 2007   History reviewed. No pertinent family history. Social History  Substance Use Topics  . Smoking status: Never Smoker   . Smokeless tobacco: None  . Alcohol Use: No   OB History    No data available     Review of Systems  Unable to perform ROS: Dementia      Allergies  Review of patient's allergies indicates no known allergies.  Home Medications   Prior to Admission medications   Medication Sig Start Date End Date Taking? Authorizing Provider  acetaminophen (TYLENOL) 325 MG tablet Take 1,300 mg by mouth every morning.    Historical Provider, MD  acetaminophen (TYLENOL) 500 MG tablet Take 500 mg by mouth every 4 (four) hours as needed for mild pain, fever or headache.    Historical Provider, MD  allopurinol (ZYLOPRIM) 300 MG tablet Take 150 mg by  mouth daily.     Historical Provider, MD  alum & mag hydroxide-simeth ([place]) 200-200-20 MG/5ML suspension Take 30 mLs by mouth every 6 (six) hours as needed for indigestion or heartburn.    Historical Provider, MD  amLODipine (NORVASC) 10 MG tablet Take 10 mg by mouth daily.     Historical Provider, MD  ammonium lactate (AMLACTIN) 12 % cream Apply 1 g topically 2 (two) times daily.    Historical Provider, MD  bag balm OINT ointment Apply 1 application topically 2 (two) times daily.    Historical Provider, MD  carvedilol (COREG) 12.5 MG tablet Take 18.75 mg by mouth 2 (two) times daily with a meal.  10/12/10   [name], MD  cetirizine (ZYRTEC) 10 MG tablet Take 10 mg by mouth daily.    Historical Provider, MD  cholecalciferol (VITAMIN D) 1000 UNITS tablet Take 1,000 Units by mouth daily.     Historical Provider, MD  divalproex (DEPAKOTE) 125 MG DR tablet Take 125 mg by mouth 3 (three) times daily.    Historical Provider, MD  donepezil (ARICEPT) 10 MG tablet Take 10 mg by mouth at bedtime.     Historical Provider, MD  furosemide (LASIX) 40 MG tablet Take 40 mg by mouth daily.     Historical Provider, MD  guaifenesin (ROBITUSSIN) 100 MG/5ML syrup Take 200 mg by mouth every 6 (six) hours as needed for cough.    Historical Provider, MD  hydrocerin (EUCERIN) CREA Apply 1 application topically daily.    Historical Provider, MD  loperamide (IMODIUM) 2 MG capsule Take 2 mg by mouth daily as needed for diarrhea or loose stools.    Historical Provider, MD  LORazepam (ATIVAN) 0.5 MG tablet Take 0.5 mg by mouth at bedtime.    Historical Provider, MD  losartan (COZAAR) 100 MG tablet Take 100 mg by mouth daily.     Historical Provider, MD  magnesium hydroxide (MILK OF MAGNESIA) 400 MG/5ML suspension Take 30 mLs by mouth at bedtime as needed for mild constipation.    Historical Provider, MD  memantine (NAMENDA XR) 7 MG CP24 24 hr capsule Take 7 mg by mouth daily.    Historical Provider, MD  Menthol, Topical  Analgesic, (BIOFREEZE EX) Apply 1 application topically 2 (two) times daily.    Historical Provider, MD  mirtazapine (REMERON) 30 MG tablet Take 15 mg by mouth at bedtime.     Historical Provider, MD  Pramoxine-Calamine (AVEENO ANTI-ITCH EX) Apply 1 application topically 3 (three) times daily.    Historical Provider, MD  traMADol (ULTRAM) 50 MG tablet Take 50 mg by mouth 2 (two) times daily.    Historical Provider, MD  traZODone (DESYREL) 100 MG tablet Take 150 mg by mouth at bedtime.     Historical Provider, MD   BP 143/96 mmHg  Pulse 75  Temp(Src) 98.5 F (36.9 C) (Oral)  Resp 16  SpO2 98% Physical Exam  Constitutional: She appears well-developed and well-nourished. No distress.  HENT:  Head: Normocephalic and atraumatic.  Eyes: EOM are normal.  Neck: Normal range of motion.  Cardiovascular: Normal rate, regular rhythm and normal heart sounds.   Pulmonary/Chest: Effort normal and breath sounds normal.  Abdominal: Soft. She exhibits no distension. There is no tenderness.  Musculoskeletal: Normal range of motion.  [place]ge motion bilateral shoulders, elbows, wrists. Forward motion bilateral hips, knees, ankles  Neurological: She is alert.  Answer simple questions. Follows commands. Moves all 4 extremity equally.  Skin: Skin is warm and dry.  Psychiatric: She has a normal mood and affect. Judgment normal.  Nursing note and vitals reviewed.   ED Course  Procedures (including critical care time) Labs Review Labs Reviewed - No data to display  Imaging Review No results found. I have personally reviewed and evaluated these images and lab results as part of my medical decision-making.   EKG Interpretation None      MDM   Final diagnoses:  Fall, initial encounter  Minor head injury, initial encounter    Indication for CT imaging of the head. Minor head injury. No signs of trauma on her head. He spent anticoagulants. Discharge back to her nursing facility with head injury  warnings.    Jola Schmidt, MD 09/10/15 1400

## 2015-09-10 NOTE — ED Notes (Signed)
Per EMS, Pt, from [facility] [address], presents after a witnessed fall.  Denies pain and complaints.  Facility reported to EMS that the Pt tripped over a wheelchair.  Sts Pt hit her head.  Pt is at neuro baseline.  Hx of dementia.

## 2015-09-10 NOTE — ED Notes (Signed)
D/c paperwork, facility paperwork, and DNR given to [facility].

## 2015-09-10 NOTE — ED Notes (Signed)
Bed: [id number] Expected date:  Expected time:  Means of arrival:  Comments: 64F/fall

## 2015-10-18 ENCOUNTER — Emergency Department (HOSPITAL_COMMUNITY)
Admission: EM | Admit: 2015-10-18 | Discharge: 2015-10-18 | Disposition: A | Discharge status: Skilled Nursing Facility | Payer: Medicare Other | Attending: Emergency Medicine | Admitting: Emergency Medicine

## 2015-10-18 ENCOUNTER — Encounter (HOSPITAL_COMMUNITY): Payer: Self-pay | Admitting: Emergency Medicine

## 2015-10-18 DIAGNOSIS — I5020 Unspecified systolic (congestive) heart failure: Secondary | ICD-10-CM | POA: Insufficient documentation

## 2015-10-18 DIAGNOSIS — R400 Somnolence: Secondary | ICD-10-CM | POA: Insufficient documentation

## 2015-10-18 DIAGNOSIS — Z79899 Other long term (current) drug therapy: Secondary | ICD-10-CM | POA: Diagnosis not present

## 2015-10-18 DIAGNOSIS — I2510 Atherosclerotic heart disease of native coronary artery without angina pectoris: Secondary | ICD-10-CM | POA: Diagnosis not present

## 2015-10-18 DIAGNOSIS — M1990 Unspecified osteoarthritis, unspecified site: Secondary | ICD-10-CM | POA: Diagnosis not present

## 2015-10-18 DIAGNOSIS — Z79891 Long term (current) use of opiate analgesic: Secondary | ICD-10-CM | POA: Insufficient documentation

## 2015-10-18 DIAGNOSIS — I110 Hypertensive heart disease with heart failure: Secondary | ICD-10-CM | POA: Insufficient documentation

## 2015-10-18 DIAGNOSIS — R404 Transient alteration of awareness: Secondary | ICD-10-CM

## 2015-10-18 DIAGNOSIS — G309 Alzheimer's disease, unspecified: Secondary | ICD-10-CM | POA: Diagnosis not present

## 2015-10-18 DIAGNOSIS — R4182 Altered mental status, unspecified: Secondary | ICD-10-CM | POA: Diagnosis present

## 2015-10-18 NOTE — ED Notes (Signed)
Pt arrived via EMS with report of found unresponsive per facility. Upon EMS arrival pt was awake, verbally responsive, and combative towards EMS attendant upon moving to strecher. Pt A/O to self. Noted generalized itchy body rash.

## 2015-10-18 NOTE — Discharge Instructions (Signed)
Confusion Confusion is the inability to think with your usual speed or clarity. Confusion may come on quickly or slowly over time. How quickly the confusion comes on depends on the cause. Confusion can be due to any number of causes. CAUSES   Concussion, head injury, or head trauma.  Seizures.  Stroke.  Fever.  Brain tumor.  Age related decreased brain function (dementia).  Heightened emotional states like rage or terror.  Mental illness in which the person loses the ability to determine what is real and what is not (hallucinations).  Infections such as a urinary tract infection (UTI).  Toxic effects from alcohol, drugs, or prescription medicines.  Dehydration and an imbalance of salts in the body (electrolytes).  Lack of sleep.  Low blood sugar (diabetes).  Low levels of oxygen from conditions such as chronic lung disorders.  Drug interactions or other medicine side effects.  Nutritional deficiencies, especially niacin, thiamine, vitamin C, or vitamin B.  Sudden drop in body temperature (hypothermia).  Change in routine, such as when traveling or hospitalized. SIGNS AND SYMPTOMS  People often describe their thinking as cloudy or unclear when they are confused. Confusion can also include feeling disoriented. That means you are unaware of where or who you are. You may also not know what the date or time is. If confused, you may also have difficulty paying attention, remembering, and making decisions. Some people also act aggressively when they are confused.  DIAGNOSIS  The medical evaluation of confusion may include:  Blood and urine tests.  X-rays.  Brain and nervous system tests.  Analyzing your brain waves (electroencephalogram or EEG).  Magnetic resonance imaging (MRI) of your head.  Computed tomography (CT) scan of your head.  Mental status tests in which your health care provider may ask many questions. Some of these questions may seem silly or strange,  but they are a very important test to help diagnose and treat confusion. TREATMENT  An admission to the hospital may not be needed, but a person with confusion should not be left alone. Stay with a family member or friend until the confusion clears. Avoid alcohol, pain relievers, or sedative drugs until you have fully recovered. Do not drive until directed by your health care provider. HOME CARE INSTRUCTIONS  What family and friends can do:  To find out if someone is confused, ask the person to state his or her name, age, and the date. If the person is unsure or answers incorrectly, he or she is confused.  Always introduce yourself, no matter how well the person knows you.  Often remind the person of his or her location.  Place a calendar and clock near the confused person.  Help the person with his or her medicines. You may want to use a pill box, an alarm as a reminder, or give the person each dose as prescribed.  Talk about current events and plans for the day.  Try to keep the environment calm, quiet, and peaceful.  Make sure the person keeps follow-up visits with his or her health care provider. PREVENTION  Ways to prevent confusion:  Avoid alcohol.  Eat a balanced diet.  Get enough sleep.  Take medicine only as directed by your health care provider.  Do not become isolated. Spend time with other people and make plans for your days.  Keep careful watch on your blood sugar levels if you are diabetic. SEEK IMMEDIATE MEDICAL CARE IF:   You develop severe headaches, repeated vomiting, seizures, blackouts, or   slurred speech.  There is increasing confusion, weakness, numbness, restlessness, or personality changes.  You develop a loss of balance, have marked dizziness, feel uncoordinated, or fall.  You have delusions, hallucinations, or develop severe anxiety.  Your family members think you need to be rechecked.   This information is not intended to replace advice given  to you by your health care provider. Make sure you discuss any questions you have with your health care provider.   Document Released: 07/14/2004 Document Revised: 06/27/2014 Document Reviewed: 07/12/2013 Elsevier Interactive Patient Education 2016 [employer]  

## 2015-10-18 NOTE — ED Notes (Signed)
Bed: [id number] Expected date: 10/18/15 Expected time: 4:27 PM Means of arrival:  Comments: EMS

## 2015-10-18 NOTE — ED Notes (Signed)
Pt moving arm around during blood pressure and cannot be directed to not do so.

## 2015-10-18 NOTE — ED Notes (Addendum)
When rounding on patient i entered the room and found the patient standing at the end of the bed trying to get dressed. I then instructed patient to get back in bed. Patient became aggressive and non compliant with this techs request during activity. With 2 other staff members present we preceded to place patient back in the bed since patient was not getting back into bed on her own. Patient is now on a bed alarm and back in bed. Meal given at this time.

## 2015-10-19 LAB — I-STAT CHEM 8, ED
BUN: 16 mg/dL (ref 6–20)
CREATININE: 1.1 mg/dL — AB (ref 0.44–1.00)
Calcium, Ion: 1.17 mmol/L (ref 1.13–1.30)
Chloride: 108 mmol/L (ref 101–111)
GLUCOSE: 104 mg/dL — AB (ref 65–99)
HEMATOCRIT: 38 % (ref 36.0–46.0)
Hemoglobin: 12.9 g/dL (ref 12.0–15.0)
POTASSIUM: 4.3 mmol/L (ref 3.5–5.1)
Sodium: 146 mmol/L — ABNORMAL HIGH (ref 135–145)
TCO2: 28 mmol/L (ref 0–100)

## 2015-10-19 NOTE — ED Provider Notes (Signed)
CSN: [id number]     Arrival date & time 10/18/15  1630 History   First MD Initiated Contact with Patient 10/18/15 1659     Chief Complaint  Patient presents with  . Altered Mental Status     (Consider location/radiation/quality/duration/timing/severity/associated sxs/prior Treatment) Patient is a 82 y.o. female presenting with altered mental status. The history is provided by the nursing home and the EMS personnel.  Altered Mental Status Presenting symptoms: partial responsiveness   Severity:  Mild Most recent episode:  Today Episode history:  Single Duration:  10 minutes Timing:  Rare Progression:  Resolved Chronicity:  Recurrent Context: dementia and nursing home resident   Associated symptoms: no abdominal pain, normal movement, no agitation, no bladder incontinence, no decreased appetite, no difficulty breathing, no fever, no headaches, no seizures, no slurred speech and no vomiting     Past Medical History  Diagnosis Date  . Diabetes mellitus   . Hypertension   . Alzheimer disease   . Psoriasis   . CAD (coronary artery disease)     Moderate coronary artery disease status post angioplasty to the right coronary artery in 2003 and an ejection fraction 46% on catheterization in 2006.  Marland Kitchen Systolic CHF ([place])     EF as low is 45%, but 55% in 2009  . Osteoarthritis   . Perforated ulcer ([place]) 2002    Perforated pyloric channel ulcer patched by Dr. [name]  . Duodenal erosion 2009    EGD by Dr. [name]  . Gastric erosion 2009    EGD by Dr. [name]   Past Surgical History  Procedure Laterality Date  . Total hip arthroplasty Right 2007   Family History  Problem Relation Age of Onset  . Family history unknown: Yes   Social History  Substance Use Topics  . Smoking status: Never Smoker   . Smokeless tobacco: None  . Alcohol Use: No   OB History    No data available     Review of Systems  Constitutional: Negative for fever and decreased appetite.  Gastrointestinal:  Negative for vomiting and abdominal pain.  Genitourinary: Negative for bladder incontinence.  Neurological: Negative for seizures and headaches.  Psychiatric/Behavioral: Negative for agitation.  All other systems reviewed and are negative.     Allergies  Review of patient's allergies indicates no known allergies.  Home Medications   Prior to Admission medications   Medication Sig Start Date End Date Taking? Authorizing Provider  acetaminophen (TYLENOL) 325 MG tablet Take 1,300 mg by mouth every morning.   Yes Historical Provider, MD  acetaminophen (TYLENOL) 500 MG tablet Take 500 mg by mouth every 4 (four) hours as needed for mild pain, fever or headache.   Yes Historical Provider, MD  allopurinol (ZYLOPRIM) 300 MG tablet Take 150 mg by mouth daily.    Yes Historical Provider, MD  alum & mag hydroxide-simeth ([place]) 200-200-20 MG/5ML suspension Take 30 mLs by mouth every 6 (six) hours as needed for indigestion or heartburn.   Yes Historical Provider, MD  amLODipine (NORVASC) 10 MG tablet Take 10 mg by mouth daily.    Yes Historical Provider, MD  ammonium lactate (AMLACTIN) 12 % cream Apply 1 g topically 2 (two) times daily.   Yes Historical Provider, MD  bag balm OINT ointment Apply 1 application topically 2 (two) times daily.   Yes Historical Provider, MD  carvedilol (COREG) 12.5 MG tablet Take 18.75 mg by mouth 2 (two) times daily with a meal.  10/12/10  Yes [name], MD  cetirizine (ZYRTEC) 10 MG tablet Take 5 mg by mouth daily.    Yes Historical Provider, MD  cholecalciferol (VITAMIN D) 1000 UNITS tablet Take 1,000 Units by mouth daily.    Yes Historical Provider, MD  divalproex (DEPAKOTE) 125 MG DR tablet Take 250 mg by mouth 3 (three) times daily.    Yes Historical Provider, MD  donepezil (ARICEPT) 10 MG tablet Take 10 mg by mouth at bedtime.    Yes Historical Provider, MD  furosemide (LASIX) 40 MG tablet Take 40 mg by mouth daily.    Yes Historical Provider, MD  guaifenesin  (ROBITUSSIN) 100 MG/5ML syrup Take 200 mg by mouth every 6 (six) hours as needed for cough.   Yes Historical Provider, MD  loperamide (IMODIUM) 2 MG capsule Take 2 mg by mouth daily as needed for diarrhea or loose stools.   Yes Historical Provider, MD  LORazepam (ATIVAN) 0.5 MG tablet Take 0.5 mg by mouth at bedtime.   Yes Historical Provider, MD  losartan (COZAAR) 100 MG tablet Take 100 mg by mouth daily.    Yes Historical Provider, MD  memantine (NAMENDA XR) 7 MG CP24 24 hr capsule Take 7 mg by mouth daily.   Yes Historical Provider, MD  Menthol, Topical Analgesic, (BIOFREEZE EX) Apply 1 application topically 2 (two) times daily.   Yes Historical Provider, MD  mineral oil external liquid Place 2 drops into both ears 2 (two) times daily.   Yes Historical Provider, MD  mirtazapine (REMERON) 15 MG tablet Take 15 mg by mouth at bedtime.   Yes Historical Provider, MD  neomycin-bacitracin-polymyxin (NEOSPORIN) [id number] ointment Apply 1 application topically daily as needed (minor skin tears of abrasions.).   Yes Historical Provider, MD  traMADol (ULTRAM) 50 MG tablet Take 50 mg by mouth 2 (two) times daily.   Yes Historical Provider, MD  traZODone (DESYREL) 100 MG tablet Take 150 mg by mouth at bedtime.    Yes Historical Provider, MD   BP 226/94 mmHg  Pulse 66  Temp(Src) 98 F (36.7 C) (Oral)  Resp 19  SpO2 100% Physical Exam  Constitutional: She appears well-developed and well-nourished. No distress.  HENT:  Head: Normocephalic.  Eyes: Conjunctivae are normal.  Neck: Neck supple. No tracheal deviation present.  Cardiovascular: Normal rate and regular rhythm.   Pulmonary/Chest: Effort normal. No respiratory distress.  Abdominal: Soft. She exhibits no distension.  Neurological: She is alert. She has normal strength. She is disoriented (at baseline per family at bedside). No cranial nerve deficit or sensory deficit. Coordination normal.  Skin: Skin is warm and dry.  Psychiatric: She has a  normal mood and affect.    ED Course  Procedures (including critical care time) Labs Review Labs Reviewed  I-STAT CHEM 8, ED    Imaging Review No results found. I have personally reviewed and evaluated these images and lab results as part of my medical decision-making.   EKG Interpretation   Date/Time:  Sunday October 18 2015 16:43:09 EDT Ventricular Rate:  67 PR Interval:  181 QRS Duration: 108 QT Interval:  450 QTC Calculation: 475 R Axis:   5 Text Interpretation:  Unknown rhythm, irregular rate Abnormal R-wave  progression, early transition Left ventricular hypertrophy Nonspecific T  abnormalities, inferior leads No significant change since last tracing  Confirmed by Ana Woodroof MD, Quillian Quince [id number]) on 10/18/2015 5:21:25 PM      MDM   Final diagnoses:  Transient alteration of awareness    82 y.o. female presents with difficulty waking in nursing facility.  On arrival she is alert and awake with normal level of disorientation. Family arrived and stated Pt is at baseline and has often been difficult to wake from sleep but this is not new. Chem 8 unremarkable but did not cross over to computers. Plan to follow up with PCP as needed and return precautions discussed for worsening or new concerning symptoms.     Leo Grosser, MD 10/19/15 [phone]

## 2015-11-02 ENCOUNTER — Emergency Department (HOSPITAL_COMMUNITY)
Admission: EM | Admit: 2015-11-02 | Discharge: 2015-11-02 | Disposition: A | Discharge status: Home / Self Care | Payer: Medicare Other | Attending: Emergency Medicine | Admitting: Emergency Medicine

## 2015-11-02 ENCOUNTER — Emergency Department (HOSPITAL_COMMUNITY): Payer: Medicare Other

## 2015-11-02 ENCOUNTER — Encounter (HOSPITAL_COMMUNITY): Payer: Self-pay | Admitting: Family Medicine

## 2015-11-02 ENCOUNTER — Ambulatory Visit (HOSPITAL_COMMUNITY)
Admission: RE | Admit: 2015-11-02 | Discharge: 2015-11-02 | Disposition: A | Discharge status: Home / Self Care | Payer: Medicare Other | Source: Ambulatory Visit | Attending: Emergency Medicine | Admitting: Emergency Medicine

## 2015-11-02 DIAGNOSIS — L409 Psoriasis, unspecified: Secondary | ICD-10-CM | POA: Insufficient documentation

## 2015-11-02 DIAGNOSIS — Y929 Unspecified place or not applicable: Secondary | ICD-10-CM | POA: Insufficient documentation

## 2015-11-02 DIAGNOSIS — E119 Type 2 diabetes mellitus without complications: Secondary | ICD-10-CM | POA: Diagnosis not present

## 2015-11-02 DIAGNOSIS — G309 Alzheimer's disease, unspecified: Secondary | ICD-10-CM | POA: Insufficient documentation

## 2015-11-02 DIAGNOSIS — Y999 Unspecified external cause status: Secondary | ICD-10-CM | POA: Diagnosis not present

## 2015-11-02 DIAGNOSIS — M1990 Unspecified osteoarthritis, unspecified site: Secondary | ICD-10-CM | POA: Insufficient documentation

## 2015-11-02 DIAGNOSIS — S00511A Abrasion of lip, initial encounter: Secondary | ICD-10-CM | POA: Insufficient documentation

## 2015-11-02 DIAGNOSIS — I2510 Atherosclerotic heart disease of native coronary artery without angina pectoris: Secondary | ICD-10-CM | POA: Diagnosis not present

## 2015-11-02 DIAGNOSIS — Y939 Activity, unspecified: Secondary | ICD-10-CM | POA: Diagnosis not present

## 2015-11-02 DIAGNOSIS — W19XXXA Unspecified fall, initial encounter: Secondary | ICD-10-CM

## 2015-11-02 DIAGNOSIS — R52 Pain, unspecified: Secondary | ICD-10-CM

## 2015-11-02 DIAGNOSIS — Z96641 Presence of right artificial hip joint: Secondary | ICD-10-CM | POA: Diagnosis not present

## 2015-11-02 DIAGNOSIS — R4182 Altered mental status, unspecified: Secondary | ICD-10-CM | POA: Insufficient documentation

## 2015-11-02 DIAGNOSIS — Z79899 Other long term (current) drug therapy: Secondary | ICD-10-CM | POA: Insufficient documentation

## 2015-11-02 DIAGNOSIS — I110 Hypertensive heart disease with heart failure: Secondary | ICD-10-CM | POA: Insufficient documentation

## 2015-11-02 DIAGNOSIS — I5020 Unspecified systolic (congestive) heart failure: Secondary | ICD-10-CM | POA: Diagnosis not present

## 2015-11-02 LAB — CBC WITH DIFFERENTIAL/PLATELET
BASOS ABS: 0 10*3/uL (ref 0.0–0.1)
Basophils Relative: 0 %
EOS ABS: 0.2 10*3/uL (ref 0.0–0.7)
EOS PCT: 4 %
HCT: 34 % — ABNORMAL LOW (ref 36.0–46.0)
HEMOGLOBIN: 11.3 g/dL — AB (ref 12.0–15.0)
Lymphocytes Relative: 33 %
Lymphs Abs: 1.4 10*3/uL (ref 0.7–4.0)
MCH: 27.9 pg (ref 26.0–34.0)
MCHC: 33.2 g/dL (ref 30.0–36.0)
MCV: 84 fL (ref 78.0–100.0)
Monocytes Absolute: 0.3 10*3/uL (ref 0.1–1.0)
Monocytes Relative: 8 %
NEUTROS PCT: 55 %
Neutro Abs: 2.3 10*3/uL (ref 1.7–7.7)
PLATELETS: 173 10*3/uL (ref 150–400)
RBC: 4.05 MIL/uL (ref 3.87–5.11)
RDW: 15 % (ref 11.5–15.5)
WBC: 4.2 10*3/uL (ref 4.0–10.5)

## 2015-11-02 LAB — PROTIME-INR
INR: 1.09 (ref 0.00–1.49)
PROTHROMBIN TIME: 14.3 s (ref 11.6–15.2)

## 2015-11-02 LAB — URINALYSIS, ROUTINE W REFLEX MICROSCOPIC
BILIRUBIN URINE: NEGATIVE
GLUCOSE, UA: NEGATIVE mg/dL
HGB URINE DIPSTICK: NEGATIVE
KETONES UR: NEGATIVE mg/dL
Leukocytes, UA: NEGATIVE
Nitrite: NEGATIVE
PH: 7 (ref 5.0–8.0)
Protein, ur: NEGATIVE mg/dL
Specific Gravity, Urine: 1.012 (ref 1.005–1.030)

## 2015-11-02 LAB — BASIC METABOLIC PANEL
Anion gap: 5 (ref 5–15)
BUN: 15 mg/dL (ref 6–20)
CO2: 28 mmol/L (ref 22–32)
CREATININE: 1.3 mg/dL — AB (ref 0.44–1.00)
Calcium: 8.8 mg/dL — ABNORMAL LOW (ref 8.9–10.3)
Chloride: 110 mmol/L (ref 101–111)
GFR, EST AFRICAN AMERICAN: 43 mL/min — AB (ref >60)
GFR, EST NON AFRICAN AMERICAN: 37 mL/min — AB (ref >60)
Glucose, Bld: 107 mg/dL — ABNORMAL HIGH (ref 65–99)
POTASSIUM: 3.8 mmol/L (ref 3.5–5.1)
SODIUM: 143 mmol/L (ref 135–145)

## 2015-11-02 LAB — TYPE AND SCREEN
ABO/RH(D): O POS
ANTIBODY SCREEN: NEGATIVE

## 2015-11-02 LAB — CK: Total CK: 104 U/L (ref 38–234)

## 2015-11-02 MED ORDER — FENTANYL CITRATE (PF) 100 MCG/2ML IJ SOLN: 50.0000 ug | INTRAMUSCULAR | Status: DC | PRN

## 2015-11-02 MED ORDER — ONDANSETRON HCL 4 MG/2ML IJ SOLN: 4.0000 mg | Freq: Once | INTRAMUSCULAR | Status: DC

## 2015-11-02 NOTE — Discharge Instructions (Signed)

## 2015-11-02 NOTE — ED Notes (Signed)
Patient is from [facility] and transported via [place] for an unwitnessed fall. Possibly from bed. Pt has a hostory of dementia but staff reports she is back to baseline. Injury noted to upper lip and possible left hip pain/injury. EMS reports outward rotation to leg with no shorting. No indication of back and neck pain with palpating.

## 2015-11-02 NOTE — ED Notes (Signed)
Patient transported to X-ray 

## 2015-11-02 NOTE — ED Provider Notes (Signed)
CSN: [id number]     Arrival date & time 11/02/15  0115 History   First MD Initiated Contact with Patient 11/02/15 0221     Chief Complaint  Patient presents with  . Altered Mental Status     (Consider location/radiation/quality/duration/timing/severity/associated sxs/prior Treatment) HPI This is a an 82 year old female sent in by her nursing care facility for fall. She is an unwitnessed fall earlier. She seems to have fallen from her bed. Unknown down time. She had an obvious deformity to the left hip with shortening of the limb and leg laying and external rotation. She has a small abrasion to the lip. No loss of consciousness. Patient has a history of dementia and there is a level V caveat. Past Medical History  Diagnosis Date  . Diabetes mellitus   . Hypertension   . Alzheimer disease   . Psoriasis   . CAD (coronary artery disease)     Moderate coronary artery disease status post angioplasty to the right coronary artery in 2003 and an ejection fraction 46% on catheterization in 2006.  Marland Kitchen Systolic CHF ([place])     EF as low is 45%, but 55% in 2009  . Osteoarthritis   . Perforated ulcer ([place]) 2002    Perforated pyloric channel ulcer patched by Dr. [name]  . Duodenal erosion 2009    EGD by Dr. [name]  . Gastric erosion 2009    EGD by Dr. [name]   Past Surgical History  Procedure Laterality Date  . Total hip arthroplasty Right 2007   Family History  Problem Relation Age of Onset  . Family history unknown: Yes   Social History  Substance Use Topics  . Smoking status: Never Smoker   . Smokeless tobacco: None  . Alcohol Use: No   OB History    No data available     Review of Systems  Unable to perform ROS: Dementia      Allergies  Review of patient's allergies indicates no known allergies.  Home Medications   Prior to Admission medications   Medication Sig Start Date End Date Taking? Authorizing Provider  allopurinol (ZYLOPRIM) 300 MG tablet Take 150 mg by mouth  daily.   Yes Historical Provider, MD  amLODipine (NORVASC) 10 MG tablet Take 10 mg by mouth daily.    Yes Historical Provider, MD  ammonium lactate (AMLACTIN) 12 % cream Apply 1 g topically 2 (two) times daily.   Yes Historical Provider, MD  bag balm OINT ointment Apply 1 application topically 2 (two) times daily.   Yes Historical Provider, MD  carvedilol (COREG) 12.5 MG tablet Take 18.75 mg by mouth 2 (two) times daily with a meal.  10/12/10  Yes [name], MD  cetirizine (ZYRTEC) 10 MG tablet Take 5 mg by mouth daily.    Yes Historical Provider, MD  cholecalciferol (VITAMIN D) 1000 UNITS tablet Take 1,000 Units by mouth daily.    Yes Historical Provider, MD  divalproex (DEPAKOTE) 125 MG DR tablet Take 250 mg by mouth 3 (three) times daily.    Yes Historical Provider, MD  donepezil (ARICEPT) 10 MG tablet Take 10 mg by mouth at bedtime.    Yes Historical Provider, MD  furosemide (LASIX) 40 MG tablet Take 40 mg by mouth daily.    Yes Historical Provider, MD  LORazepam (ATIVAN) 0.5 MG tablet Take 0.5 mg by mouth at bedtime.   Yes Historical Provider, MD  losartan (COZAAR) 100 MG tablet Take 100 mg by mouth daily.    Yes  Historical Provider, MD  magnesium hydroxide (MILK OF MAGNESIA) 400 MG/5ML suspension Take 30 mLs by mouth daily as needed for mild constipation.   Yes Historical Provider, MD  memantine (NAMENDA XR) 14 MG CP24 24 hr capsule Take 14 mg by mouth daily.   Yes Historical Provider, MD  Menthol, Topical Analgesic, (BIOFREEZE EX) Apply 1 application topically 2 (two) times daily.   Yes Historical Provider, MD  mineral oil external liquid Place 2 drops into both ears 2 (two) times daily.   Yes Historical Provider, MD  mirtazapine (REMERON) 15 MG tablet Take 15 mg by mouth at bedtime.   Yes Historical Provider, MD  traZODone (DESYREL) 100 MG tablet Take 150 mg by mouth at bedtime.    Yes Historical Provider, MD  acetaminophen (TYLENOL) 500 MG tablet Take 500 mg by mouth every 4 (four)  hours as needed for mild pain, fever or headache.    Historical Provider, MD  alum & mag hydroxide-simeth ([place]) 200-200-20 MG/5ML suspension Take 30 mLs by mouth every 6 (six) hours as needed for indigestion or heartburn.    Historical Provider, MD  guaifenesin (ROBITUSSIN) 100 MG/5ML syrup Take 200 mg by mouth every 6 (six) hours as needed for cough.    Historical Provider, MD  loperamide (IMODIUM) 2 MG capsule Take 2 mg by mouth daily as needed for diarrhea or loose stools.    Historical Provider, MD  neomycin-bacitracin-polymyxin (NEOSPORIN) [phone] ointment Apply 1 application topically daily as needed (minor skin tears of abrasions.).    Historical Provider, MD   BP 172/90 mmHg  Pulse 66  Temp(Src) 97.7 F (36.5 C) (Oral)  Resp 20  SpO2 100% Physical Exam  Constitutional: She appears well-developed and well-nourished. No distress.  HENT:  Head: Normocephalic and atraumatic.  Poor dentition Small abrasion to the lip  Eyes: Conjunctivae are normal. No scleral icterus.  Neck: Normal range of motion.  Cardiovascular: Normal rate, regular rhythm and normal heart sounds.  Exam reveals no gallop and no friction rub.   No murmur heard. Pulmonary/Chest: Effort normal and breath sounds normal. No respiratory distress.  Abdominal: Soft. Bowel sounds are normal. She exhibits no distension and no mass. There is no tenderness. There is no guarding.  Musculoskeletal:  Left leg. Sitting about 3 inches shorter than the right, sitting and external rotation, and exquisite pain with any movement of the leg. Distal pulses intact  Neurological: She is alert.  Skin: Skin is warm and dry. She is not diaphoretic.  Nursing note and vitals reviewed.   ED Course  Procedures (including critical care time) Labs Review Labs Reviewed  BASIC METABOLIC PANEL - Abnormal; Notable for the following:    Glucose, Bld 107 (*)    Creatinine, Ser 1.30 (*)    Calcium 8.8 (*)    GFR calc non Af Amer 37 (*)     GFR calc Af Amer 43 (*)    All other components within normal limits  CBC WITH DIFFERENTIAL/PLATELET - Abnormal; Notable for the following:    Hemoglobin 11.3 (*)    HCT 34.0 (*)    All other components within normal limits  PROTIME-INR  URINALYSIS, ROUTINE W REFLEX MICROSCOPIC (NOT AT [facility])  CK  TYPE AND SCREEN    Imaging Review Ct Head Wo Contrast  11/02/2015  CLINICAL DATA:  Unwitnessed fall. History of dementia. Patient uncooperative with multiple times at performing a non motion degraded examination. Attempts at performing maxillofacial CT were abandoned secondary to patient's inability to cooperate with the  examination. EXAM: CT HEAD WITHOUT CONTRAST TECHNIQUE: Contiguous axial images were obtained from the base of the skull through the vertex without intravenous contrast. COMPARISON:  03/25/2015 FINDINGS: Examination is markedly degraded secondary to patient motion, necessitating the acquisition of additional images. Brain: Re- demonstrated advanced atrophy with sulcal prominence centralized volume loss. Scattered periventricular hypodensities compatible microvascular ischemic disease. Given extensive background parenchymal abnormalities and motion degradation, there is no definitive CT evidence of superimposed acute large territory infarct. No intraparenchymal or extra-axial hemorrhage. Unchanged appearance of known approximately 1.7 x 2.0 cm meningioma about the inner table of the right-side of the frontal calvarium (image 10, series 8). Unchanged size and configuration of the ventricles and basilar cisterns. No midline shift. Vascular: Intracranial atherosclerosis Skull: Negative for fracture or focal lesion. Sinuses/Orbits: No acute findings.  Post bilateral cataract surgery. Other: Regional soft tissues appear normal. IMPRESSION: 1. Markedly degraded examination without definitive evidence of acute intracranial process. Further evaluation with repeat imaging after sedation could be  performed as indicated. 2. Similar findings of atrophy and microvascular ischemic disease. 3. Grossly unchanged appearance of approximately 2 cm meningioma about the inner table of the right-side of the frontal calvarium, similar to the 03/2015 examination. Electronically Signed   By: Sandi Mariscal M.D.   On: 11/02/2015 08:39   Dg Hip Unilat With Pelvis 2-3 Views Left  11/02/2015  CLINICAL DATA:  Unwitnessed fall. History of dementia. Left hip pain. EXAM: DG HIP (WITH OR WITHOUT PELVIS) 2-3V LEFT COMPARISON:  11/02/2012 FINDINGS: Pelvis appears intact. SI joints and symphysis pubis are not displaced. Degenerative changes in the lower lumbar spine. Visualized sacrum appears intact. Right hip arthroplasty, incompletely included within the field of view. No dislocation. Degenerative changes in the left hip with narrowing and sclerosis at the superior and middle acetabular joints and small osteophytes on both sides of the joint. No evidence of acute fracture or dislocation in the left hip. No focal bone lesion or bone destruction. Vascular calcifications. IMPRESSION: Degenerative changes in the left hip. No acute fracture or dislocation. Electronically Signed   By: Lucienne Capers M.D.   On: 11/02/2015 03:26   I have personally reviewed and evaluated these images and lab results as part of my medical decision-making.   EKG Interpretation None      MDM   Final diagnoses:  Fall  Fall, initial encounter    Patient with negative imaging. She is able to ambulate. THe patient is demented and would not sit still for the CT scan. The patient does not appear to have any acute changes, although image is very poor. SHe is at baseline, per staff at her nursing home. She has no marked neurologic defetcs. She appears  Safe for discharge.    Margarita Mail, PA-C 11/03/15 1609  Noemi Chapel, MD 11/04/15 [phone]

## 2015-11-02 NOTE — ED Notes (Signed)
[facility] called  

## 2015-11-02 NOTE — ED Notes (Signed)
[facility] arrived.  

## 2015-11-02 NOTE — ED Notes (Signed)
Found patient standing on side of stretcher, redirected patient back on stretcher. Pt's shirt was removed due to being wet and removed soaked diaper from urine. Provided incontinence pads. Repositioned on stretcher. Side rails up x 2. Provided warm blankets x 2. Call bell with in reach.

## 2015-11-02 NOTE — ED Notes (Signed)
Pt was able to ambulate with assistance 12 feet.

## 2015-11-02 NOTE — ED Notes (Addendum)
Pt was not able to ambulate.   Pt sat on the side of bed with assistance.   Could not stand with assistance to ambulate.

## 2015-11-14 ENCOUNTER — Encounter (HOSPITAL_COMMUNITY): Payer: Self-pay | Admitting: Emergency Medicine

## 2015-11-14 ENCOUNTER — Emergency Department (HOSPITAL_COMMUNITY)
Admission: EM | Admit: 2015-11-14 | Discharge: 2015-11-14 | Disposition: A | Discharge status: Home / Self Care | Payer: No Typology Code available for payment source | Attending: Emergency Medicine | Admitting: Emergency Medicine

## 2015-11-14 ENCOUNTER — Emergency Department (HOSPITAL_COMMUNITY)
Admission: EM | Admit: 2015-11-14 | Discharge: 2015-11-14 | Disposition: A | Discharge status: Home / Self Care | Payer: Medicare Other | Attending: Emergency Medicine | Admitting: Emergency Medicine

## 2015-11-14 ENCOUNTER — Emergency Department (HOSPITAL_COMMUNITY): Payer: Medicare Other

## 2015-11-14 DIAGNOSIS — Y939 Activity, unspecified: Secondary | ICD-10-CM | POA: Insufficient documentation

## 2015-11-14 DIAGNOSIS — I110 Hypertensive heart disease with heart failure: Secondary | ICD-10-CM | POA: Diagnosis not present

## 2015-11-14 DIAGNOSIS — M79621 Pain in right upper arm: Secondary | ICD-10-CM | POA: Diagnosis not present

## 2015-11-14 DIAGNOSIS — Y9289 Other specified places as the place of occurrence of the external cause: Secondary | ICD-10-CM | POA: Diagnosis not present

## 2015-11-14 DIAGNOSIS — M1990 Unspecified osteoarthritis, unspecified site: Secondary | ICD-10-CM | POA: Diagnosis not present

## 2015-11-14 DIAGNOSIS — Z79899 Other long term (current) drug therapy: Secondary | ICD-10-CM | POA: Diagnosis not present

## 2015-11-14 DIAGNOSIS — W1839XA Other fall on same level, initial encounter: Secondary | ICD-10-CM | POA: Diagnosis not present

## 2015-11-14 DIAGNOSIS — Z96641 Presence of right artificial hip joint: Secondary | ICD-10-CM | POA: Insufficient documentation

## 2015-11-14 DIAGNOSIS — I2510 Atherosclerotic heart disease of native coronary artery without angina pectoris: Secondary | ICD-10-CM | POA: Insufficient documentation

## 2015-11-14 DIAGNOSIS — E119 Type 2 diabetes mellitus without complications: Secondary | ICD-10-CM | POA: Insufficient documentation

## 2015-11-14 DIAGNOSIS — W19XXXA Unspecified fall, initial encounter: Secondary | ICD-10-CM

## 2015-11-14 DIAGNOSIS — Y92481 Parking lot as the place of occurrence of the external cause: Secondary | ICD-10-CM | POA: Diagnosis not present

## 2015-11-14 DIAGNOSIS — I5020 Unspecified systolic (congestive) heart failure: Secondary | ICD-10-CM | POA: Insufficient documentation

## 2015-11-14 DIAGNOSIS — M545 Low back pain: Secondary | ICD-10-CM | POA: Insufficient documentation

## 2015-11-14 DIAGNOSIS — Y999 Unspecified external cause status: Secondary | ICD-10-CM | POA: Insufficient documentation

## 2015-11-14 DIAGNOSIS — M25511 Pain in right shoulder: Secondary | ICD-10-CM | POA: Diagnosis present

## 2015-11-14 DIAGNOSIS — G309 Alzheimer's disease, unspecified: Secondary | ICD-10-CM | POA: Insufficient documentation

## 2015-11-14 DIAGNOSIS — Z96649 Presence of unspecified artificial hip joint: Secondary | ICD-10-CM | POA: Diagnosis not present

## 2015-11-14 DIAGNOSIS — M79601 Pain in right arm: Secondary | ICD-10-CM
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
## 2015-11-14 NOTE — ED Notes (Signed)
Brought in by [facility] from [facility] facility with c/o right shoulder pain after an unwitnessed fall.  Per [facility], pt was found lying on a hardwood floor between bed and dresser with her right arm under bed.  Pt c/o right shoulder pain on movement to right arm.   Pt has Alzheimer's Dementia.

## 2015-11-14 NOTE — ED Notes (Signed)
[facility] was called for pt's transportation back to [facility].

## 2015-11-14 NOTE — ED Notes (Signed)
Bed: [id number] Expected date:  Expected time:  Means of arrival:  Comments: EMS 82yo F; fall

## 2015-11-14 NOTE — ED Notes (Signed)
Pt able to ambulate with staff assistance and a walker---- able to bear weight and move around without s/s pain.

## 2015-11-14 NOTE — Discharge Instructions (Signed)
You may take Tylenol as needed for pain. Follow-up with your primary care doctor as needed.  Cryotherapy Cryotherapy is when you put ice on your injury. Ice helps lessen pain and puffiness (swelling) after an injury. Ice works the best when you start using it in the first 24 to 48 hours after an injury. HOME CARE  Put a dry or damp towel between the ice pack and your skin.  You may press gently on the ice pack.  Leave the ice on for no more than 10 to 20 minutes at a time.  Check your skin after 5 minutes to make sure your skin is okay.  Rest at least 20 minutes between ice pack uses.  Stop using ice when your skin loses feeling (numbness).  Do not use ice on someone who cannot tell you when it hurts. This includes small children and people with memory problems (dementia). GET HELP RIGHT AWAY IF:  You have white spots on your skin.  Your skin turns blue or pale.  Your skin feels waxy or hard.  Your puffiness gets worse. MAKE SURE YOU:   Understand these instructions.  Will watch your condition.  Will get help right away if you are not doing well or get worse.   This information is not intended to replace advice given to you by your health care provider. Make sure you discuss any questions you have with your health care provider.   Document Released: 11/23/2007 Document Revised: 08/29/2011 Document Reviewed: 01/27/2011 Elsevier Interactive Patient Education [employer].

## 2015-11-14 NOTE — ED Provider Notes (Signed)
CSN: [id number]     Arrival date & time 11/14/15  0419 History   First MD Initiated Contact with Patient 11/14/15 [phone]     Chief Complaint  Patient presents with  . Fall  . Shoulder Pain     (Consider location/radiation/quality/duration/timing/severity/associated sxs/prior Treatment) HPI Comments: 82 year old female with history of diabetes mellitus, hypertension, Alzheimer's dementia, coronary artery disease, and CHF presents to the emergency department for evaluation of injuries following an unwitnessed fall. Patient presenting from [facility] home facility with reported complaints of right shoulder pain. Pertinent EMS, patient was found lying on a hardwood floor between her bed and the dresser with her right arm under the bed. Patient has no complaints of pain during my encounter with her. She is complaining of being cold.  Level V caveat applies secondary to dementia  Patient is a 82 y.o. female presenting with fall and shoulder pain. The history is provided by the patient. No language interpreter was used.  Fall  Shoulder Pain   Past Medical History  Diagnosis Date  . Diabetes mellitus   . Hypertension   . Alzheimer disease   . Psoriasis   . CAD (coronary artery disease)     Moderate coronary artery disease status post angioplasty to the right coronary artery in 2003 and an ejection fraction 46% on catheterization in 2006.  Marland Kitchen Systolic CHF ([place])     EF as low is 45%, but 55% in 2009  . Osteoarthritis   . Perforated ulcer ([place]) 2002    Perforated pyloric channel ulcer patched by Dr. [name]  . Duodenal erosion 2009    EGD by Dr. [name]  . Gastric erosion 2009    EGD by Dr. [name]   Past Surgical History  Procedure Laterality Date  . Total hip arthroplasty Right 2007   Family History  Problem Relation Age of Onset  . Family history unknown: Yes   Social History  Substance Use Topics  . Smoking status: Never Smoker   . Smokeless tobacco: None  . Alcohol  Use: No   OB History    No data available      Review of Systems  Unable to perform ROS: Dementia    Allergies  Review of patient's allergies indicates no known allergies.  Home Medications   Prior to Admission medications   Medication Sig Start Date End Date Taking? Authorizing Provider  acetaminophen (TYLENOL) 325 MG tablet Take 1,300 mg by mouth daily.   Yes Historical Provider, MD  acetaminophen (TYLENOL) 500 MG tablet Take 500 mg by mouth every 4 (four) hours as needed for mild pain, fever or headache.   Yes Historical Provider, MD  allopurinol (ZYLOPRIM) 300 MG tablet Take 150 mg by mouth daily.   Yes Historical Provider, MD  alum & mag hydroxide-simeth ([place]) 200-200-20 MG/5ML suspension Take 30 mLs by mouth every 6 (six) hours as needed for indigestion or heartburn.   Yes Historical Provider, MD  amLODipine (NORVASC) 10 MG tablet Take 10 mg by mouth daily.    Yes Historical Provider, MD  ammonium lactate (AMLACTIN) 12 % cream Apply 1 g topically 2 (two) times daily.   Yes Historical Provider, MD  bag balm OINT ointment Apply 1 application topically 2 (two) times daily.   Yes Historical Provider, MD  carvedilol (COREG) 12.5 MG tablet Take 18.75 mg by mouth 2 (two) times daily with a meal.  10/12/10  Yes [name], MD  cetirizine (ZYRTEC) 10 MG tablet Take 5 mg by mouth  daily.    Yes Historical Provider, MD  cholecalciferol (VITAMIN D) 1000 UNITS tablet Take 1,000 Units by mouth daily.    Yes Historical Provider, MD  divalproex (DEPAKOTE) 125 MG DR tablet Take 250 mg by mouth 3 (three) times daily.    Yes Historical Provider, MD  donepezil (ARICEPT) 10 MG tablet Take 10 mg by mouth at bedtime.    Yes Historical Provider, MD  furosemide (LASIX) 40 MG tablet Take 40 mg by mouth daily.    Yes Historical Provider, MD  guaifenesin (ROBITUSSIN) 100 MG/5ML syrup Take 200 mg by mouth every 6 (six) hours as needed for cough.   Yes Historical Provider, MD  loperamide (IMODIUM) 2 MG  capsule Take 2 mg by mouth daily as needed for diarrhea or loose stools.   Yes Historical Provider, MD  LORazepam (ATIVAN) 0.5 MG tablet Take 0.5 mg by mouth at bedtime.   Yes Historical Provider, MD  losartan (COZAAR) 100 MG tablet Take 100 mg by mouth daily.    Yes Historical Provider, MD  magnesium hydroxide (MILK OF MAGNESIA) 400 MG/5ML suspension Take 30 mLs by mouth daily as needed for mild constipation.   Yes Historical Provider, MD  memantine (NAMENDA XR) 14 MG CP24 24 hr capsule Take 14 mg by mouth daily.   Yes Historical Provider, MD  Menthol, Topical Analgesic, (BIOFREEZE EX) Apply 1 application topically 2 (two) times daily.   Yes Historical Provider, MD  mineral oil external liquid Place 2 drops into both ears 2 (two) times daily.   Yes Historical Provider, MD  mirtazapine (REMERON) 15 MG tablet Take 15 mg by mouth at bedtime.   Yes Historical Provider, MD  neomycin-bacitracin-polymyxin (NEOSPORIN) [phone] ointment Apply 1 application topically daily as needed (minor skin tears of abrasions.).   Yes Historical Provider, MD  traZODone (DESYREL) 100 MG tablet Take 150 mg by mouth at bedtime.    Yes Historical Provider, MD   BP 141/111 mmHg  Pulse 75  Temp(Src) 97.7 F (36.5 C) (Oral)  Resp 16  SpO2 100%   Physical Exam  Constitutional: She is oriented to person, place, and time. She appears well-developed and well-nourished. No distress.  Nontoxic appearing and in no distress.  HENT:  Head: Normocephalic and atraumatic.  No skull instability, battle's sign or raccoon's eyes.  Eyes: Conjunctivae and EOM are normal. No scleral icterus.  Neck: Normal range of motion.  Normal ROM exhibited.   Cardiovascular: Normal rate, regular rhythm and intact distal pulses.   Distal radial pulse intact b/l  Pulmonary/Chest: Effort normal. No respiratory distress. She has no wheezes.  Respirations even and unlabored  Musculoskeletal: Normal range of motion.  Fairly preserved ROM of the  RUE. No bony deformity or crepitus noted. No reproducible TTP.  Neurological: She is alert and oriented to person, place, and time.  Patient moving all extremities. She is awake and alert. Answers questions appropriately.  Skin: Skin is warm and dry. No rash noted. She is not diaphoretic. No erythema. No pallor.  Psychiatric: She has a normal mood and affect. Her behavior is normal.  Nursing note and vitals reviewed.   ED Course  Procedures (including critical care time) Labs Review Labs Reviewed - No data to display  Imaging Review Dg Shoulder Right  11/14/2015  CLINICAL DATA:  Right shoulder pain after unwitnessed fall. EXAM: RIGHT SHOULDER - 2+ VIEW COMPARISON:  None. FINDINGS: Limited glenohumeral assessment due to positioning and habitus. No displaced fracture. Probable glenohumeral osteoarthritis. Acromioclavicular joint is congruent. IMPRESSION:  No evidence of fracture or subluxation. Technically limited exam due to positioning and patient habitus. Electronically Signed   By: Jeb Levering M.D.   On: 11/14/2015 04:58     I have personally reviewed and evaluated these images and lab results as part of my medical decision-making.   EKG Interpretation None      MDM   Final diagnoses:  Fall, initial encounter  Right arm pain    82 year old female presents to the emergency department after an unwitnessed fall. She was brought to the ED by EMS from her nursing facility. Patient with no evidence of trauma or injury on exam. She reportedly had complaints of right upper extremity pain prior to arrival, but denies this when in the ED. She has no reproducible pain and range of motion of her right upper extremity is preserved. Patient is neurovascularly intact. Her x-ray is not suspicious for fracture or subluxation.  Patient is able to bear weight in the emergency department using a walker. She has no complaints of pain with ambulation. No evidence of acute head injury or trauma on  exam. Patient is not on blood thinners. I do not believe further emergent workup or imaging is indicated. Patient stable for discharge back to her nursing facility. I have advised Tylenol for treatment of any residual discomfort. Patient to be transported via [place] [facility].   Filed Vitals:   11/14/15 0424  BP: 141/111  Pulse: 75  Temp: 97.7 F (36.5 C)  TempSrc: Oral  Resp: 16  SpO2: 100%     Antonietta Breach, PA-C [id number]  Delora Fuel, MD [id number] [phone]

## 2015-11-14 NOTE — ED Notes (Signed)
[facility] here to transport pt back to [facility].
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
## 2015-12-11 ENCOUNTER — Emergency Department (HOSPITAL_COMMUNITY): Payer: Medicare Other

## 2015-12-11 ENCOUNTER — Encounter (HOSPITAL_COMMUNITY): Payer: Self-pay | Admitting: Emergency Medicine

## 2015-12-11 ENCOUNTER — Emergency Department (HOSPITAL_COMMUNITY)
Admission: EM | Admit: 2015-12-11 | Discharge: 2015-12-11 | Disposition: A | Discharge status: Home / Self Care | Payer: Medicare Other | Attending: Emergency Medicine | Admitting: Emergency Medicine

## 2015-12-11 DIAGNOSIS — G309 Alzheimer's disease, unspecified: Secondary | ICD-10-CM | POA: Diagnosis not present

## 2015-12-11 DIAGNOSIS — M545 Low back pain: Secondary | ICD-10-CM | POA: Diagnosis not present

## 2015-12-11 DIAGNOSIS — I2510 Atherosclerotic heart disease of native coronary artery without angina pectoris: Secondary | ICD-10-CM | POA: Diagnosis not present

## 2015-12-11 DIAGNOSIS — W1839XA Other fall on same level, initial encounter: Secondary | ICD-10-CM | POA: Diagnosis not present

## 2015-12-11 DIAGNOSIS — Y939 Activity, unspecified: Secondary | ICD-10-CM | POA: Insufficient documentation

## 2015-12-11 DIAGNOSIS — I110 Hypertensive heart disease with heart failure: Secondary | ICD-10-CM | POA: Diagnosis not present

## 2015-12-11 DIAGNOSIS — E119 Type 2 diabetes mellitus without complications: Secondary | ICD-10-CM | POA: Diagnosis not present

## 2015-12-11 DIAGNOSIS — W19XXXA Unspecified fall, initial encounter: Secondary | ICD-10-CM

## 2015-12-11 DIAGNOSIS — I5020 Unspecified systolic (congestive) heart failure: Secondary | ICD-10-CM | POA: Insufficient documentation

## 2015-12-11 DIAGNOSIS — Z79899 Other long term (current) drug therapy: Secondary | ICD-10-CM | POA: Diagnosis not present

## 2015-12-11 DIAGNOSIS — Z96641 Presence of right artificial hip joint: Secondary | ICD-10-CM | POA: Diagnosis not present

## 2015-12-11 DIAGNOSIS — Y929 Unspecified place or not applicable: Secondary | ICD-10-CM | POA: Insufficient documentation

## 2015-12-11 DIAGNOSIS — Y999 Unspecified external cause status: Secondary | ICD-10-CM | POA: Insufficient documentation

## 2015-12-11 MED ORDER — HALOPERIDOL LACTATE 5 MG/ML IJ SOLN
5.0000 mg | Freq: Once | INTRAMUSCULAR | Status: AC
  Administered 2015-12-11: 5 mg via INTRAMUSCULAR
  Filled 2015-12-11: qty 1

## 2015-12-11 NOTE — ED Notes (Signed)
Pt in EMS from [facility] facility, pt had unwitnessed fall, found on floor head was pinned inbetween bed and nightstand. Only deformity noted is abrasion to L knee. No thinners. Pt has hx of dementia, per facility pt is at her baseline.

## 2015-12-11 NOTE — ED Notes (Signed)
Pt attempting to pull BP cuff and pulse ox off, placed BP cuff on LLE and pulse on on R big toe, pt high fall risk, has dementia. Curtain open, in sight of this RN when sitting at desk. Fall band and socks in place. Pt has call bell and was instructed to use.

## 2015-12-11 NOTE — Discharge Instructions (Signed)

## 2015-12-11 NOTE — ED Notes (Signed)
PTAR at bedside 

## 2015-12-11 NOTE — ED Provider Notes (Signed)
CSN: [id number]     Arrival date & time 12/11/15  0243 History  By signing my name below, I, Gwenlyn Fudge, attest that this documentation has been prepared under the direction and in the presence of Gareth Morgan, MD. Electronically Signed: Gwenlyn Fudge, ED Scribe. 12/11/2015. 3:45 AM.    Chief Complaint  Patient presents with  . Fall    The history is provided by the nursing home and the EMS personnel. No language interpreter was used.   LEVEL 5 CAVEAT DUE TO DEMENTIA  HPI Comments: Emily [name] is a 82 y.o. female brought in by EMS with PMHx of CAD, DM, HTN, Alzheimer disease, and Osteoarthritis who presents to the Emergency Department here after unwitnessed fall sustained PTA. Pt was found on the floor with her head pinned between the bed and her nightstand.  Pt verbalizes that she is experiencing back pain. She is not on any blood thinners. Pt is currently at her baseline.  Past Medical History  Diagnosis Date  . Diabetes mellitus   . Hypertension   . Alzheimer disease   . Psoriasis   . CAD (coronary artery disease)     Moderate coronary artery disease status post angioplasty to the right coronary artery in 2003 and an ejection fraction 46% on catheterization in 2006.  Marland Kitchen Systolic CHF ([place])     EF as low is 45%, but 55% in 2009  . Osteoarthritis   . Perforated ulcer ([place]) 2002    Perforated pyloric channel ulcer patched by Dr. [name]  . Duodenal erosion 2009    EGD by Dr. [name]  . Gastric erosion 2009    EGD by Dr. [name]   Past Surgical History  Procedure Laterality Date  . Total hip arthroplasty Right 2007   Family History  Problem Relation Age of Onset  . Family history unknown: Yes   Social History  Substance Use Topics  . Smoking status: Never Smoker   . Smokeless tobacco: None  . Alcohol Use: No   OB History    No data available     Review of Systems  Unable to perform ROS: Dementia      Allergies  Review of patient's allergies indicates no known  allergies.  Home Medications   Prior to Admission medications   Medication Sig Start Date End Date Taking? Authorizing Provider  acetaminophen (TYLENOL) 325 MG tablet Take 1,300 mg by mouth daily.   Yes Historical Provider, MD  acetaminophen (TYLENOL) 500 MG tablet Take 500 mg by mouth every 4 (four) hours as needed for mild pain, fever or headache.   Yes Historical Provider, MD  allopurinol (ZYLOPRIM) 300 MG tablet Take 150 mg by mouth daily.   Yes Historical Provider, MD  alum & mag hydroxide-simeth ([place]) 200-200-20 MG/5ML suspension Take 30 mLs by mouth every 6 (six) hours as needed for indigestion or heartburn.   Yes Historical Provider, MD  amLODipine (NORVASC) 10 MG tablet Take 10 mg by mouth daily.    Yes Historical Provider, MD  ammonium lactate (AMLACTIN) 12 % cream Apply 1 g topically 2 (two) times daily.   Yes Historical Provider, MD  bag balm OINT ointment Apply 1 application topically 2 (two) times daily.   Yes Historical Provider, MD  carvedilol (COREG) 12.5 MG tablet Take 18.75 mg by mouth 2 (two) times daily with a meal.  10/12/10  Yes [name], MD  cetirizine (ZYRTEC) 10 MG tablet Take 5 mg by mouth daily.    Yes Historical Provider,  MD  cholecalciferol (VITAMIN D) 1000 UNITS tablet Take 1,000 Units by mouth daily.    Yes Historical Provider, MD  divalproex (DEPAKOTE) 125 MG DR tablet Take 250 mg by mouth 3 (three) times daily.    Yes Historical Provider, MD  donepezil (ARICEPT) 10 MG tablet Take 10 mg by mouth at bedtime.    Yes Historical Provider, MD  furosemide (LASIX) 40 MG tablet Take 40 mg by mouth daily.    Yes Historical Provider, MD  guaifenesin (ROBITUSSIN) 100 MG/5ML syrup Take 200 mg by mouth every 6 (six) hours as needed for cough.   Yes Historical Provider, MD  loperamide (IMODIUM) 2 MG capsule Take 2 mg by mouth daily as needed for diarrhea or loose stools.   Yes Historical Provider, MD  LORazepam (ATIVAN) 0.5 MG tablet Take 0.5 mg by mouth at bedtime.    Yes Historical Provider, MD  losartan (COZAAR) 100 MG tablet Take 100 mg by mouth daily.    Yes Historical Provider, MD  magnesium hydroxide (MILK OF MAGNESIA) 400 MG/5ML suspension Take 30 mLs by mouth daily as needed for mild constipation.   Yes Historical Provider, MD  memantine (NAMENDA XR) 14 MG CP24 24 hr capsule Take 14 mg by mouth daily.   Yes Historical Provider, MD  Menthol, Topical Analgesic, (BIOFREEZE EX) Apply 1 application topically 2 (two) times daily.   Yes Historical Provider, MD  mineral oil external liquid Place 2 drops into both ears 2 (two) times daily.   Yes Historical Provider, MD  mirtazapine (REMERON) 15 MG tablet Take 15 mg by mouth at bedtime.   Yes Historical Provider, MD  neomycin-bacitracin-polymyxin (NEOSPORIN) [phone] ointment Apply 1 application topically daily as needed (minor skin tears of abrasions.).   Yes Historical Provider, MD  risperiDONE (RISPERDAL) 1 MG/ML oral solution Take 0.5 mg by mouth 2 (two) times daily.   Yes Historical Provider, MD  traZODone (DESYREL) 100 MG tablet Take 150 mg by mouth at bedtime.    Yes Historical Provider, MD   BP 171/75 mmHg  Pulse 60  Temp(Src) 97.9 F (36.6 C) (Oral)  Resp 14  Ht 5\' 7"  (1.702 m)  Wt 250 lb (113.399 kg)  BMI 39.15 kg/m2  SpO2 98% Physical Exam  Constitutional: She appears well-developed and well-nourished. No distress.  HENT:  Head: Normocephalic and atraumatic.  Eyes: Conjunctivae and EOM are normal.  Neck: Normal range of motion.  Cardiovascular: Normal rate, regular rhythm, normal heart sounds and intact distal pulses.  Exam reveals no gallop and no friction rub.   No murmur heard. Pulmonary/Chest: Effort normal and breath sounds normal. No respiratory distress. She has no wheezes. She has no rales.  Abdominal: Soft. She exhibits no distension. There is no tenderness. There is no guarding.  Musculoskeletal: Normal range of motion. She exhibits tenderness. She exhibits no edema.  Pt reports  cervical spine tenderness and lumbar tenderness  Agitated with ROM of right hip    Neurological: She is alert.  Agitated when removing blankets for exam Oriented only to self  Skin: Skin is warm and dry. No rash noted. She is not diaphoretic. No erythema.  Nursing note and vitals reviewed.   ED Course  Procedures (including critical care time)  DIAGNOSTIC STUDIES: Oxygen Saturation is 98% on RA, normal by my interpretation.    COORDINATION OF CARE: 3:42 AM Discussed treatment plan with pt at bedside which includes CT Head, CT Cervical, and DG Lumbar.  Labs Review Labs Reviewed - No data to display  Imaging Review Dg Lumbar Spine Complete  12/11/2015  CLINICAL DATA:  82 year old female with fall and back pain. EXAM: LUMBAR SPINE - COMPLETE 4+ VIEW COMPARISON:  Lumbar spine radiograph dated 11/01/2012 FINDINGS: Evaluation is limited due to osteopenia as well as inability of the patient to cooperate with the study. No definite acute fracture or subluxation. There is multilevel degenerative changes with osteophyte primarily at T11 T12 and L4-5. The visualized transverse and spinous processes appear intact. Right hip arthroplasty partially visualized. There is diffuse atherosclerotic calcification of the abdominal aorta. A 7 mm radiopaque focus in the right flank area may represent fecal content or renal calculus. IMPRESSION: No acute/traumatic lumbar spine pathology. Multilevel degenerative changes. Electronically Signed   By: Anner Crete M.D.   On: 12/11/2015 06:35   Ct Head Wo Contrast  12/11/2015  CLINICAL DATA:  Initial evaluation for acute trauma, fall. EXAM: CT HEAD WITHOUT CONTRAST CT CERVICAL SPINE WITHOUT CONTRAST TECHNIQUE: Multidetector CT imaging of the head and cervical spine was performed following the standard protocol without intravenous contrast. Multiplanar CT image reconstructions of the cervical spine were also generated. COMPARISON:  Prior CT from 11/02/2015.  FINDINGS: CT HEAD FINDINGS Diffuse prominence of the CSF containing spaces is compatible with generalized cerebral atrophy. Mild chronic small vessel ischemic disease present within the periventricular and deep white matter. Scattered vascular calcifications present within the carotid siphons and distal vertebral arteries. No acute intracranial hemorrhage. No acute large vessel territory infarct. 18 mm calcified hemangioma at the anterior right frontal convexity. No associated edema. No other mass lesion. No midline shift or mass effect. No hydrocephalus. No extra-axial fluid collection. Scalp soft tissues demonstrate no acute abnormality. Globes and orbits demonstrate no acute process. Mild scattered mucosal thickening within the ethmoidal air cells. Paranasal sinuses are otherwise clear. No mastoid effusion. Calvarium intact. CT CERVICAL SPINE FINDINGS Study mildly degraded by motion artifact. The vertebral bodies are normally aligned with preservation of the normal cervical lordosis. Vertebral body heights are preserved. Normal C1-2 articulations are intact. No prevertebral soft tissue swelling. No acute fracture or listhesis. Moderate to advanced multilevel degenerative spondylolysis is evidenced by intervertebral disc space narrowing, endplate sclerosis, and osteophytosis, most prevalent at C4-5 through C6-7. Multilevel facet arthropathy noted. Visualized soft tissues of the neck demonstrate no acute abnormality. Scattered vascular calcifications about the carotid siphons. Visualized lung apices are clear without evidence of apical pneumothorax. IMPRESSION: CT BRAIN: 1. No acute intracranial process. 2. 18 mm calcified meningioma at the anterior right frontal convexity without significant edema. 3. Generalized cerebral atrophy with chronic small vessel ischemic disease. CT CERVICAL SPINE: 1. No acute traumatic injury within the cervical spine. 2. Advanced multilevel degenerative spondylolysis, most severe at  C4-5 through C6-7. Electronically Signed   By: Jeannine Boga M.D.   On: 12/11/2015 06:59   Ct Cervical Spine Wo Contrast  12/11/2015  CLINICAL DATA:  Initial evaluation for acute trauma, fall. EXAM: CT HEAD WITHOUT CONTRAST CT CERVICAL SPINE WITHOUT CONTRAST TECHNIQUE: Multidetector CT imaging of the head and cervical spine was performed following the standard protocol without intravenous contrast. Multiplanar CT image reconstructions of the cervical spine were also generated. COMPARISON:  Prior CT from 11/02/2015. FINDINGS: CT HEAD FINDINGS Diffuse prominence of the CSF containing spaces is compatible with generalized cerebral atrophy. Mild chronic small vessel ischemic disease present within the periventricular and deep white matter. Scattered vascular calcifications present within the carotid siphons and distal vertebral arteries. No acute intracranial hemorrhage. No acute large vessel territory infarct. 18 mm  calcified hemangioma at the anterior right frontal convexity. No associated edema. No other mass lesion. No midline shift or mass effect. No hydrocephalus. No extra-axial fluid collection. Scalp soft tissues demonstrate no acute abnormality. Globes and orbits demonstrate no acute process. Mild scattered mucosal thickening within the ethmoidal air cells. Paranasal sinuses are otherwise clear. No mastoid effusion. Calvarium intact. CT CERVICAL SPINE FINDINGS Study mildly degraded by motion artifact. The vertebral bodies are normally aligned with preservation of the normal cervical lordosis. Vertebral body heights are preserved. Normal C1-2 articulations are intact. No prevertebral soft tissue swelling. No acute fracture or listhesis. Moderate to advanced multilevel degenerative spondylolysis is evidenced by intervertebral disc space narrowing, endplate sclerosis, and osteophytosis, most prevalent at C4-5 through C6-7. Multilevel facet arthropathy noted. Visualized soft tissues of the neck  demonstrate no acute abnormality. Scattered vascular calcifications about the carotid siphons. Visualized lung apices are clear without evidence of apical pneumothorax. IMPRESSION: CT BRAIN: 1. No acute intracranial process. 2. 18 mm calcified meningioma at the anterior right frontal convexity without significant edema. 3. Generalized cerebral atrophy with chronic small vessel ischemic disease. CT CERVICAL SPINE: 1. No acute traumatic injury within the cervical spine. 2. Advanced multilevel degenerative spondylolysis, most severe at C4-5 through C6-7. Electronically Signed   By: Jeannine Boga M.D.   On: 12/11/2015 06:59   Dg Hip Unilat With Pelvis 2-3 Views Right  12/11/2015  CLINICAL DATA:  Initial evaluation for acute trauma, fall. EXAM: DG HIP (WITH OR WITHOUT PELVIS) 2-3V RIGHT COMPARISON:  None. FINDINGS: Visualized bony pelvis intact. SI joints approximated. No pubic diastasis. Limited views of the left hip demonstrate no acute abnormality. Advanced degenerative changes within the lower lumbar spine. Degenerative osteoarthrosis at the left hip. Right hip arthroplasty in place. No acute fracture or or dislocation. Mild osseous resorption about the acetabular component. Osteopenia. No acute soft tissue abnormality.  Vascular calcifications noted. IMPRESSION: 1. No acute fracture or dislocation. 2. Right hip arthroplasty in place without complication. 3. Advanced degenerative changes within the lower lumbar spine with degenerative osteoarthrosis about the left hip. 4. Atheromatous disease. Electronically Signed   By: Jeannine Boga M.D.   On: 12/11/2015 06:35   I have personally reviewed and evaluated these images and lab results as part of my medical decision-making.   EKG Interpretation None      MDM   Final diagnoses:  Fall    82yo female with hx of CAD, DM, htn, alzeimer's dementia, presents with concern for unwitnessed fall. Patient at her baseline, however agitated and unable  to lay still for imaging and haldol was given. CT head, cervical spine and XR lumbar and hip performed without signs of injuries. Exam limited by dementia, however pt appears neurologically intact.  Patient returned to baseline, stable for discharge to facility.   I personally performed the services described in this documentation, which was scribed in my presence. The recorded information has been reviewed and is accurate.    Gareth Morgan, MD 12/12/15 1423

## 2015-12-11 NOTE — ED Notes (Signed)
EDP aware pt tried to jump off xray table, order to admin 5mg  Haldol IM when pt returns

## 2015-12-11 NOTE — ED Notes (Signed)
Pt taken to xray; unable to perform imaging, per tech; pt attempted to jump off of imaging table

## 2015-12-11 NOTE — ED Notes (Signed)
EDP at bedside  

## 2015-12-14 ENCOUNTER — Encounter (HOSPITAL_COMMUNITY): Payer: Self-pay

## 2015-12-14 ENCOUNTER — Other Ambulatory Visit: Payer: Self-pay

## 2015-12-14 ENCOUNTER — Emergency Department (HOSPITAL_COMMUNITY): Payer: Medicare Other

## 2015-12-14 ENCOUNTER — Emergency Department (HOSPITAL_COMMUNITY)
Admission: EM | Admit: 2015-12-14 | Discharge: 2015-12-14 | Disposition: A | Discharge status: Home / Self Care | Payer: Medicare Other | Attending: Emergency Medicine | Admitting: Emergency Medicine

## 2015-12-14 DIAGNOSIS — E119 Type 2 diabetes mellitus without complications: Secondary | ICD-10-CM | POA: Diagnosis not present

## 2015-12-14 DIAGNOSIS — I5020 Unspecified systolic (congestive) heart failure: Secondary | ICD-10-CM | POA: Diagnosis not present

## 2015-12-14 DIAGNOSIS — I2510 Atherosclerotic heart disease of native coronary artery without angina pectoris: Secondary | ICD-10-CM | POA: Insufficient documentation

## 2015-12-14 DIAGNOSIS — R262 Difficulty in walking, not elsewhere classified: Secondary | ICD-10-CM

## 2015-12-14 DIAGNOSIS — I110 Hypertensive heart disease with heart failure: Secondary | ICD-10-CM | POA: Insufficient documentation

## 2015-12-14 DIAGNOSIS — Z79899 Other long term (current) drug therapy: Secondary | ICD-10-CM | POA: Diagnosis not present

## 2015-12-14 DIAGNOSIS — G309 Alzheimer's disease, unspecified: Secondary | ICD-10-CM | POA: Insufficient documentation

## 2015-12-14 DIAGNOSIS — R531 Weakness: Secondary | ICD-10-CM

## 2015-12-14 DIAGNOSIS — M1990 Unspecified osteoarthritis, unspecified site: Secondary | ICD-10-CM | POA: Diagnosis not present

## 2015-12-14 LAB — COMPREHENSIVE METABOLIC PANEL WITH GFR
ALT: 12 U/L — ABNORMAL LOW (ref 14–54)
AST: 16 U/L (ref 15–41)
Albumin: 3.6 g/dL (ref 3.5–5.0)
Alkaline Phosphatase: 80 U/L (ref 38–126)
Anion gap: 9 (ref 5–15)
BUN: 18 mg/dL (ref 6–20)
CO2: 28 mmol/L (ref 22–32)
Calcium: 9.1 mg/dL (ref 8.9–10.3)
Chloride: 108 mmol/L (ref 101–111)
Creatinine, Ser: 1.13 mg/dL — ABNORMAL HIGH (ref 0.44–1.00)
GFR calc Af Amer: 51 mL/min — ABNORMAL LOW
GFR calc non Af Amer: 44 mL/min — ABNORMAL LOW
Glucose, Bld: 162 mg/dL — ABNORMAL HIGH (ref 65–99)
Potassium: 3.7 mmol/L (ref 3.5–5.1)
Sodium: 145 mmol/L (ref 135–145)
Total Bilirubin: 0.6 mg/dL (ref 0.3–1.2)
Total Protein: 8.2 g/dL — ABNORMAL HIGH (ref 6.5–8.1)

## 2015-12-14 LAB — URINALYSIS, ROUTINE W REFLEX MICROSCOPIC
Bilirubin Urine: NEGATIVE
Glucose, UA: NEGATIVE mg/dL
HGB URINE DIPSTICK: NEGATIVE
Ketones, ur: NEGATIVE mg/dL
LEUKOCYTES UA: NEGATIVE
Nitrite: NEGATIVE
PROTEIN: NEGATIVE mg/dL
Specific Gravity, Urine: 1.013 (ref 1.005–1.030)
pH: 6.5 (ref 5.0–8.0)

## 2015-12-14 LAB — CBC WITH DIFFERENTIAL/PLATELET
Basophils Absolute: 0 10*3/uL (ref 0.0–0.1)
Basophils Relative: 0 %
EOS PCT: 3 %
Eosinophils Absolute: 0.1 10*3/uL (ref 0.0–0.7)
HCT: 35 % — ABNORMAL LOW (ref 36.0–46.0)
Hemoglobin: 11.5 g/dL — ABNORMAL LOW (ref 12.0–15.0)
LYMPHS ABS: 1.3 10*3/uL (ref 0.7–4.0)
LYMPHS PCT: 27 %
MCH: 27.6 pg (ref 26.0–34.0)
MCHC: 32.9 g/dL (ref 30.0–36.0)
MCV: 84.1 fL (ref 78.0–100.0)
MONO ABS: 0.5 10*3/uL (ref 0.1–1.0)
Monocytes Relative: 10 %
Neutro Abs: 2.8 10*3/uL (ref 1.7–7.7)
Neutrophils Relative %: 60 %
PLATELETS: 182 10*3/uL (ref 150–400)
RBC: 4.16 MIL/uL (ref 3.87–5.11)
RDW: 15 % (ref 11.5–15.5)
WBC: 4.6 10*3/uL (ref 4.0–10.5)

## 2015-12-14 LAB — VALPROIC ACID LEVEL: Valproic Acid Lvl: 60 ug/mL (ref 50.0–100.0)

## 2015-12-14 NOTE — ED Notes (Signed)
Bed: WA08 Expected date:  Expected time:  Means of arrival:  Comments: EMS- 85/FALL

## 2015-12-14 NOTE — ED Notes (Signed)
Pt was able to ambulate with assistance. MD was notified.

## 2015-12-14 NOTE — ED Provider Notes (Signed)
CSN: [id number]     Arrival date & time 12/14/15  1426 History   First MD Initiated Contact with Patient 12/14/15 1501     CC: Generalized weakness, difficulty walking   (Consider location/radiation/quality/duration/timing/severity/associated sxs/prior Treatment) HPI Comments: Patient with history of Alzheimer's disease, diabetes -- presents from her facility with generalized weakness. Patient was seen in emergency department 3 days ago after a fall. She had imaging of her head, neck and lower back which was negative. She was discharged to home. Since that time she has been unable to ambulate well at her baseline. Per nursing home report, mental status is at baseline. Level V caveat due to dementia.  The history is provided by medical records.    Past Medical History  Diagnosis Date  . Diabetes mellitus   . Hypertension   . Alzheimer disease   . Psoriasis   . CAD (coronary artery disease)     Moderate coronary artery disease status post angioplasty to the right coronary artery in 2003 and an ejection fraction 46% on catheterization in 2006.  Marland Kitchen Systolic CHF ([place])     EF as low is 45%, but 55% in 2009  . Osteoarthritis   . Perforated ulcer ([place]) 2002    Perforated pyloric channel ulcer patched by Dr. [name]  . Duodenal erosion 2009    EGD by Dr. [name]  . Gastric erosion 2009    EGD by Dr. [name]   Past Surgical History  Procedure Laterality Date  . Total hip arthroplasty Right 2007   Family History  Problem Relation Age of Onset  . Family history unknown: Yes   Social History  Substance Use Topics  . Smoking status: Never Smoker   . Smokeless tobacco: None  . Alcohol Use: No   OB History    No data available     Review of Systems  Unable to perform ROS: Dementia      Allergies  Review of patient's allergies indicates no known allergies.  Home Medications   Prior to Admission medications   Medication Sig Start Date End Date Taking? Authorizing Provider   acetaminophen (TYLENOL) 325 MG tablet Take 1,300 mg by mouth daily.   Yes Historical Provider, MD  acetaminophen (TYLENOL) 500 MG tablet Take 500 mg by mouth every 4 (four) hours as needed for mild pain, fever or headache.   Yes Historical Provider, MD  allopurinol (ZYLOPRIM) 300 MG tablet Take 150 mg by mouth daily.   Yes Historical Provider, MD  alum & mag hydroxide-simeth (MINTOX) 200-200-20 MG/5ML suspension Take 30 mLs by mouth every 6 (six) hours as needed for indigestion or heartburn.   Yes Historical Provider, MD  amLODipine (NORVASC) 10 MG tablet Take 10 mg by mouth daily.    Yes Historical Provider, MD  bag balm OINT ointment Apply 1 application topically 2 (two) times daily.   Yes Historical Provider, MD  carvedilol (COREG) 12.5 MG tablet Take 18.75 mg by mouth 2 (two) times daily with a meal.  10/12/10  Yes [name], MD  cetirizine (ZYRTEC) 5 MG tablet Take 5 mg by mouth daily.   Yes Historical Provider, MD  cholecalciferol (VITAMIN D) 1000 UNITS tablet Take 1,000 Units by mouth daily.    Yes Historical Provider, MD  divalproex (DEPAKOTE) 125 MG DR tablet Take 250 mg by mouth 3 (three) times daily.    Yes Historical Provider, MD  donepezil (ARICEPT) 10 MG tablet Take 10 mg by mouth at bedtime.    Yes Historical Provider,  MD  furosemide (LASIX) 40 MG tablet Take 40 mg by mouth daily.    Yes Historical Provider, MD  guaifenesin (ROBITUSSIN) 100 MG/5ML syrup Take 200 mg by mouth every 6 (six) hours as needed for cough.   Yes Historical Provider, MD  loperamide (IMODIUM) 2 MG capsule Take 2 mg by mouth daily as needed for diarrhea or loose stools.   Yes Historical Provider, MD  LORazepam (ATIVAN) 0.5 MG tablet Take 0.5 mg by mouth at bedtime.   Yes Historical Provider, MD  losartan (COZAAR) 100 MG tablet Take 100 mg by mouth daily.    Yes Historical Provider, MD  magnesium hydroxide (MILK OF MAGNESIA) 400 MG/5ML suspension Take 30 mLs by mouth daily as needed for mild constipation.    Yes Historical Provider, MD  memantine (NAMENDA XR) 14 MG CP24 24 hr capsule Take 14 mg by mouth daily.   Yes Historical Provider, MD  Menthol, Topical Analgesic, (BIOFREEZE EX) Apply 1 application topically 2 (two) times daily.   Yes Historical Provider, MD  mineral oil external liquid Place 2 drops into both ears 2 (two) times daily.   Yes Historical Provider, MD  mirtazapine (REMERON) 15 MG tablet Take 15 mg by mouth at bedtime.   Yes Historical Provider, MD  neomycin-bacitracin-polymyxin (NEOSPORIN) [phone] ointment Apply 1 application topically daily as needed (minor skin tears of abrasions.).   Yes Historical Provider, MD  risperiDONE (RISPERDAL) 1 MG/ML oral solution Take 0.5 mg by mouth 2 (two) times daily.   Yes Historical Provider, MD  traZODone (DESYREL) 150 MG tablet Take 150 mg by mouth at bedtime.   Yes Historical Provider, MD   BP 152/73 mmHg  Pulse 66  Temp(Src) 97.7 F (36.5 C) (Oral)  Resp 16  SpO2 99%   Physical Exam  Constitutional: She appears well-developed and well-nourished.  HENT:  Head: Normocephalic and atraumatic.  Eyes: Conjunctivae are normal. Right eye exhibits no discharge. Left eye exhibits no discharge.  Neck: Normal range of motion. Neck supple.  Cardiovascular: Normal rate, regular rhythm and normal heart sounds.   Pulmonary/Chest: Effort normal and breath sounds normal. No respiratory distress. She has no wheezes. She has no rales.  Abdominal: Soft. There is no tenderness. There is no rebound and no guarding.  Musculoskeletal: She exhibits tenderness.       Right hip: Normal.       Left hip: Normal.       Right knee: Normal.       Left knee: She exhibits normal range of motion. Tenderness found.       Right ankle: Normal.       Left ankle: Normal.  Neurological: She is alert.  Patient is demented. She speaks. Does not follow commands or respond appropriately. Neuro exam limited 2/2 inability to follow commands.   Skin: Skin is warm and dry.   Psychiatric: She has a normal mood and affect.  Nursing note and vitals reviewed.   ED Course  Procedures (including critical care time) Labs Review Labs Reviewed  CBC WITH DIFFERENTIAL/PLATELET - Abnormal; Notable for the following:    Hemoglobin 11.5 (*)    HCT 35.0 (*)    All other components within normal limits  COMPREHENSIVE METABOLIC PANEL - Abnormal; Notable for the following:    Glucose, Bld 162 (*)    Creatinine, Ser 1.13 (*)    Total Protein 8.2 (*)    ALT 12 (*)    GFR calc non Af Amer 44 (*)    GFR calc  Af Amer 51 (*)    All other components within normal limits  VALPROIC ACID LEVEL  URINALYSIS, ROUTINE W REFLEX MICROSCOPIC (NOT AT [facility])    Imaging Review Dg Chest 2 View  12/14/2015  CLINICAL DATA:  [place] last week, diabetes mellitus, hypertension, CHF, coronary artery disease EXAM: CHEST  2 VIEW COMPARISON:  11/16/2011 FINDINGS: Enlargement of cardiac silhouette. Calcified tortuous aorta. Mediastinal contours and pulmonary vascularity normal. Atelectasis versus minimal infiltrate in RIGHT upper lobe adjacent to minor fissure. Remaining lungs clear. No pleural effusion or pneumothorax. BILATERAL glenohumeral degenerative changes. IMPRESSION: Enlargement of cardiac silhouette. Atelectasis versus minimal infiltrate RIGHT upper lobe. Aortic atherosclerosis. Electronically Signed   By: Lavonia Dana M.D.   On: 12/14/2015 15:59   Dg Knee Complete 4 Views Left  12/14/2015  CLINICAL DATA:  [place] last week, unable to ambulate, question struck knee during fall, will not bear weight on it EXAM: LEFT KNEE - COMPLETE 4+ VIEW COMPARISON:  None FINDINGS: Osseous demineralization. Osteoarthritic changes with joint space narrowing and spur formation greatest at medial compartment where bone-on-bone appearance is identified. Mild chondrocalcinosis. No acute fracture, dislocation or bone destruction. Question minimal knee joint effusion. Scattered atherosclerotic calcifications. IMPRESSION:  Osseous demineralization with osteoarthritic changes and question CPPD LEFT knee. No acute abnormalities. Electronically Signed   By: Lavonia Dana M.D.   On: 12/14/2015 15:58   I have personally reviewed and evaluated these images and lab results as part of my medical decision-making.   EKG Interpretation   Date/Time:  Monday December 14 2015 16:12:28 EDT Ventricular Rate:  68 PR Interval:    QRS Duration: 120 QT Interval:  466 QTC Calculation: 496 R Axis:   -2 Text Interpretation:  Sinus rhythm LVH with IVCD and secondary repol abnrm  Inferior infarct, age indeterminate Baseline wander in lead(s) V3  Confirmed by Wilson Singer  MD, STEPHEN ([id number]) on 12/14/2015 6:38:02 PM       3:18 PM Patient seen and examined. Work-up initiated.    Vital signs reviewed and are as follows: BP 152/73 mmHg  Pulse 66  Temp(Src) 97.7 F (36.5 C) (Oral)  Resp 16  SpO2 99%  7:34 PM Work-up completed. Patient discussed with Dr. Wilson Singer who will see. Patient has been unwilling to ambulate. Her behavior is somewhat erratic given dementia. Will attempt to ensure she can walk with assistance. If she can, will be able to discharge.   8:43 PM Patient has ambulated with assistance. No indications for admission at this time. Will discharge back to her facility.  MDM   Final diagnoses:  Generalized weakness  Trouble walking   Patient presents with generalized weakness and difficulty walking. She is at her baseline mental status. Workup in the emergency department is unrevealing. Patient has been able to ambulate with assistance which is her baseline. UA normal.  No dangerous or life-threatening conditions suspected or identified by history, physical exam, and by work-up. No indications for hospitalization identified.      Carlisle Cater, PA-C 12/14/15 2045  Varney Biles, MD 12/15/15 1053

## 2015-12-14 NOTE — ED Notes (Signed)
Pt is from [facility] (facility).  Pt had a fall last week.  She was seen at hospital and sent back.  Since the fall Pt has been weaker and unable to ambulate to baseline.  Pt has Dementia/Alzheimers.  BP 160/82 P 64 RR 18 98% RA  CBG 366.  Pt is alert and will answer questions but not answer them properly.  This is her baseline per facility.

## 2015-12-14 NOTE — ED Notes (Signed)
[employer] notified of need for transport.

## 2015-12-14 NOTE — ED Notes (Signed)
Report given to [facility] no questions at time of discharge.

## 2015-12-14 NOTE — ED Notes (Signed)
Patient transported to X-ray 

## 2015-12-14 NOTE — Discharge Instructions (Signed)
Please read and follow all provided instructions.  Your diagnoses today include:  1. Generalized weakness   2. Trouble walking    Tests performed today include:  Blood counts and electrolytes  Urine test - no infection  EKG  X-ray of the left knee - arthritis, no broken bones  Vital signs. See below for your results today.   Medications prescribed:   None  Take any prescribed medications only as directed.  Home care instructions:  Follow any educational materials contained in this packet.  Follow-up instructions: Please follow-up with your primary care provider in the next 3 days for further evaluation of your symptoms.   Return instructions:   Please return to the Emergency Department if you experience worsening symptoms.   Please return if you have any other emergent concerns.  Additional Information:  Your vital signs today were: BP 116/92 mmHg   Pulse 61   Temp(Src) 97.9 F (36.6 C) (Oral)   Resp 16   SpO2 98% If your blood pressure (BP) was elevated above 135/85 this visit, please have this repeated by your doctor within one month. --------------

## 2015-12-14 NOTE — ED Notes (Signed)
Still awaiting transport of pt back to facility. [facility] notified and they advised that EMS was tied up at this time but pt was still on list for transport.

## 2015-12-14 NOTE — ED Notes (Signed)
ED PA at bedside

## 2015-12-18 ENCOUNTER — Encounter (HOSPITAL_COMMUNITY): Payer: Self-pay | Admitting: Emergency Medicine

## 2015-12-18 ENCOUNTER — Emergency Department (HOSPITAL_COMMUNITY)
Admission: EM | Admit: 2015-12-18 | Discharge: 2015-12-18 | Disposition: A | Discharge status: Home / Self Care | Payer: Medicare Other | Attending: Emergency Medicine | Admitting: Emergency Medicine

## 2015-12-18 ENCOUNTER — Emergency Department (HOSPITAL_COMMUNITY): Payer: Medicare Other

## 2015-12-18 DIAGNOSIS — G309 Alzheimer's disease, unspecified: Secondary | ICD-10-CM | POA: Insufficient documentation

## 2015-12-18 DIAGNOSIS — I2510 Atherosclerotic heart disease of native coronary artery without angina pectoris: Secondary | ICD-10-CM | POA: Insufficient documentation

## 2015-12-18 DIAGNOSIS — Y939 Activity, unspecified: Secondary | ICD-10-CM | POA: Insufficient documentation

## 2015-12-18 DIAGNOSIS — Z79899 Other long term (current) drug therapy: Secondary | ICD-10-CM | POA: Insufficient documentation

## 2015-12-18 DIAGNOSIS — Z792 Long term (current) use of antibiotics: Secondary | ICD-10-CM | POA: Insufficient documentation

## 2015-12-18 DIAGNOSIS — E119 Type 2 diabetes mellitus without complications: Secondary | ICD-10-CM | POA: Diagnosis not present

## 2015-12-18 DIAGNOSIS — I110 Hypertensive heart disease with heart failure: Secondary | ICD-10-CM | POA: Insufficient documentation

## 2015-12-18 DIAGNOSIS — S0093XA Contusion of unspecified part of head, initial encounter: Secondary | ICD-10-CM

## 2015-12-18 DIAGNOSIS — I5020 Unspecified systolic (congestive) heart failure: Secondary | ICD-10-CM | POA: Insufficient documentation

## 2015-12-18 DIAGNOSIS — W1800XA Striking against unspecified object with subsequent fall, initial encounter: Secondary | ICD-10-CM | POA: Diagnosis not present

## 2015-12-18 DIAGNOSIS — W19XXXA Unspecified fall, initial encounter: Secondary | ICD-10-CM

## 2015-12-18 DIAGNOSIS — M1990 Unspecified osteoarthritis, unspecified site: Secondary | ICD-10-CM | POA: Diagnosis not present

## 2015-12-18 DIAGNOSIS — Y999 Unspecified external cause status: Secondary | ICD-10-CM | POA: Insufficient documentation

## 2015-12-18 DIAGNOSIS — Y92238 Other place in hospital as the place of occurrence of the external cause: Secondary | ICD-10-CM | POA: Insufficient documentation

## 2015-12-18 DIAGNOSIS — S0990XA Unspecified injury of head, initial encounter: Secondary | ICD-10-CM | POA: Diagnosis present

## 2015-12-18 NOTE — ED Notes (Signed)
Bed: [facility] Expected date:  Expected time:  Means of arrival:  Comments: 80F/witnessed fall

## 2015-12-18 NOTE — ED Provider Notes (Signed)
CSN: [id number]     Arrival date & time 12/18/15  1443 History   First MD Initiated Contact with Patient 12/18/15 1508     Chief Complaint  Patient presents with  . Fall     Patient is a 82 y.o. female presenting with fall. The history is provided by the patient. No language interpreter was used.  Fall   Emily [name] is a 82 y.o. female who presents to the Emergency Department complaining of fall.  Level V caveat due to dementia.  Per report Patient had a witnessed fall and struck her head in a dementia care unit. No reports of recent illness. Patient has no complaints. She is requesting fried chicken.  Past Medical History  Diagnosis Date  . Diabetes mellitus   . Hypertension   . Alzheimer disease   . Psoriasis   . CAD (coronary artery disease)     Moderate coronary artery disease status post angioplasty to the right coronary artery in 2003 and an ejection fraction 46% on catheterization in 2006.  Marland Kitchen Systolic CHF ([place])     EF as low is 45%, but 55% in 2009  . Osteoarthritis   . Perforated ulcer ([place]) 2002    Perforated pyloric channel ulcer patched by Dr. [name]  . Duodenal erosion 2009    EGD by Dr. [name]  . Gastric erosion 2009    EGD by Dr. [name]   Past Surgical History  Procedure Laterality Date  . Total hip arthroplasty Right 2007   Family History  Problem Relation Age of Onset  . Family history unknown: Yes   Social History  Substance Use Topics  . Smoking status: Never Smoker   . Smokeless tobacco: None  . Alcohol Use: No   OB History    No data available     Review of Systems  All other systems reviewed and are negative.     Allergies  Review of patient's allergies indicates no known allergies.  Home Medications   Prior to Admission medications   Medication Sig Start Date End Date Taking? Authorizing Provider  acetaminophen (TYLENOL) 325 MG tablet Take 1,300 mg by mouth daily.   Yes Historical Provider, MD  acetaminophen (TYLENOL) 500 MG tablet  Take 500 mg by mouth every 4 (four) hours as needed for mild pain, fever or headache.   Yes Historical Provider, MD  allopurinol (ZYLOPRIM) 300 MG tablet Take 150 mg by mouth daily.   Yes Historical Provider, MD  alum & mag hydroxide-simeth (MINTOX) 200-200-20 MG/5ML suspension Take 30 mLs by mouth every 6 (six) hours as needed for indigestion or heartburn.   Yes Historical Provider, MD  amLODipine (NORVASC) 10 MG tablet Take 10 mg by mouth daily.    Yes Historical Provider, MD  ammonium lactate (AMLACTIN) 12 % cream Apply 1 g topically 2 (two) times daily.   Yes Historical Provider, MD  bag balm OINT ointment Apply 1 application topically 2 (two) times daily.   Yes Historical Provider, MD  carvedilol (COREG) 12.5 MG tablet Take 18.75 mg by mouth 2 (two) times daily with a meal.  10/12/10  Yes [name], MD  cetirizine (ZYRTEC) 5 MG tablet Take 5 mg by mouth daily.   Yes Historical Provider, MD  cholecalciferol (VITAMIN D) 1000 UNITS tablet Take 1,000 Units by mouth daily.    Yes Historical Provider, MD  divalproex (DEPAKOTE) 125 MG DR tablet Take 250 mg by mouth 3 (three) times daily.    Yes Historical Provider, MD  donepezil (ARICEPT) 10 MG tablet Take 10 mg by mouth at bedtime.    Yes Historical Provider, MD  furosemide (LASIX) 40 MG tablet Take 40 mg by mouth daily.    Yes Historical Provider, MD  guaifenesin (ROBITUSSIN) 100 MG/5ML syrup Take 200 mg by mouth every 6 (six) hours as needed for cough.   Yes Historical Provider, MD  loperamide (IMODIUM) 2 MG capsule Take 2 mg by mouth daily as needed for diarrhea or loose stools.   Yes Historical Provider, MD  LORazepam (ATIVAN) 0.5 MG tablet Take 0.5 mg by mouth at bedtime.   Yes Historical Provider, MD  losartan (COZAAR) 100 MG tablet Take 100 mg by mouth daily.    Yes Historical Provider, MD  magnesium hydroxide (MILK OF MAGNESIA) 400 MG/5ML suspension Take 30 mLs by mouth daily as needed for mild constipation.   Yes Historical Provider, MD   memantine (NAMENDA XR) 14 MG CP24 24 hr capsule Take 14 mg by mouth daily.   Yes Historical Provider, MD  Menthol, Topical Analgesic, (BIOFREEZE EX) Apply 1 application topically 2 (two) times daily.   Yes Historical Provider, MD  mineral oil external liquid Place 2 drops into both ears 2 (two) times daily.   Yes Historical Provider, MD  mirtazapine (REMERON) 15 MG tablet Take 15 mg by mouth at bedtime.   Yes Historical Provider, MD  neomycin-bacitracin-polymyxin (NEOSPORIN) [phone] ointment Apply 1 application topically daily as needed (minor skin tears of abrasions.).   Yes Historical Provider, MD  risperiDONE (RISPERDAL) 1 MG/ML oral solution Take 0.5 mg by mouth 2 (two) times daily.   Yes Historical Provider, MD  traZODone (DESYREL) 150 MG tablet Take 150 mg by mouth at bedtime.   Yes Historical Provider, MD   BP 190/79 mmHg  Pulse 70  Temp(Src) 97.8 F (36.6 C) (Oral)  Resp 16  SpO2 96% Physical Exam  Constitutional: She appears well-developed and well-nourished.  HENT:  Head: Normocephalic and atraumatic.  Cardiovascular: Normal rate and regular rhythm.   No murmur heard. Pulmonary/Chest: Effort normal and breath sounds normal. No respiratory distress.  Abdominal: Soft. There is no tenderness. There is no rebound and no guarding.  Musculoskeletal: She exhibits no edema or tenderness.  Neurological: She is alert.  Disoriented to place and time. Moves all extremities symmetrically  Skin: Skin is warm and dry.  Dry and flaking skin  Psychiatric: She has a normal mood and affect. Her behavior is normal.  Nursing note and vitals reviewed.   ED Course  Procedures (including critical care time) Labs Review Labs Reviewed - No data to display  Imaging Review Ct Head Wo Contrast  12/18/2015  CLINICAL DATA:  Head injury after witnessed fall today. EXAM: CT HEAD WITHOUT CONTRAST CT CERVICAL SPINE WITHOUT CONTRAST TECHNIQUE: Multidetector CT imaging of the head and cervical spine  was performed following the standard protocol without intravenous contrast. Multiplanar CT image reconstructions of the cervical spine were also generated. COMPARISON:  CT scan of December 11, 2015. FINDINGS: CT HEAD FINDINGS Bony calvarium appears intact. Mild diffuse cortical atrophy is noted. Mild chronic ischemic white matter disease is noted. Stable calcified right frontal meningioma is noted. No mass effect or midline shift is noted. Ventricular size is within normal limits. There is no evidence of hemorrhage or acute infarction. CT CERVICAL SPINE FINDINGS Moderate degenerative disc disease is noted at C4-5, C5-6 and C6-7 with anterior osteophyte formation. Grade 1 anterolisthesis of C3-4 is noted secondary to posterior facet joint hypertrophy. Minimal grade 1  anterolisthesis of C7-T1 is also noted secondary to posterior facet joint hypertrophy. Visualized lung apices are unremarkable. IMPRESSION: Mild diffuse cortical atrophy. Mild chronic ischemic white matter disease. Stable calcified right frontal meningioma. No acute intracranial abnormality seen. Moderate multilevel degenerative disc disease. No acute abnormality seen in the cervical spine. Electronically Signed   By: Marijo Conception, M.D.   On: 12/18/2015 16:08   Ct Cervical Spine Wo Contrast  12/18/2015  CLINICAL DATA:  Head injury after witnessed fall today. EXAM: CT HEAD WITHOUT CONTRAST CT CERVICAL SPINE WITHOUT CONTRAST TECHNIQUE: Multidetector CT imaging of the head and cervical spine was performed following the standard protocol without intravenous contrast. Multiplanar CT image reconstructions of the cervical spine were also generated. COMPARISON:  CT scan of December 11, 2015. FINDINGS: CT HEAD FINDINGS Bony calvarium appears intact. Mild diffuse cortical atrophy is noted. Mild chronic ischemic white matter disease is noted. Stable calcified right frontal meningioma is noted. No mass effect or midline shift is noted. Ventricular size is within  normal limits. There is no evidence of hemorrhage or acute infarction. CT CERVICAL SPINE FINDINGS Moderate degenerative disc disease is noted at C4-5, C5-6 and C6-7 with anterior osteophyte formation. Grade 1 anterolisthesis of C3-4 is noted secondary to posterior facet joint hypertrophy. Minimal grade 1 anterolisthesis of C7-T1 is also noted secondary to posterior facet joint hypertrophy. Visualized lung apices are unremarkable. IMPRESSION: Mild diffuse cortical atrophy. Mild chronic ischemic white matter disease. Stable calcified right frontal meningioma. No acute intracranial abnormality seen. Moderate multilevel degenerative disc disease. No acute abnormality seen in the cervical spine. Electronically Signed   By: Marijo Conception, M.D.   On: 12/18/2015 16:08   I have personally reviewed and evaluated these images and lab results as part of my medical decision-making.   EKG Interpretation None      MDM   Final diagnoses:  Fall, initial encounter  Head contusion, initial encounter  Patient here for evaluation of injuries following a fall. She has no complaints in the emergency department. Attempted to contact her power of attorney but there is no answer or answering machine available. Plan to DC home with outpatient follow-up.    Quintella Reichert, MD 12/18/15 2245

## 2015-12-18 NOTE — ED Notes (Signed)
Patient transported to CT 

## 2015-12-18 NOTE — ED Notes (Signed)
Pt keeps trying to get out of bed, unable be redirected. Pt put on non-violent belt restraint.

## 2015-12-18 NOTE — ED Notes (Addendum)
Per EMs. Pt from [facility]. Hx of dementia. Pt had witnessed fall today and hit head. Pt denies any complaints and is oriented per norm. Sent here per facility protocol to be seen.

## 2015-12-18 NOTE — Discharge Instructions (Signed)
Contusion °A contusion is a deep bruise. Contusions are the result of a blunt injury to tissues and muscle fibers under the skin. The injury causes bleeding under the skin. The skin overlying the contusion may turn blue, purple, or yellow. Minor injuries will give you a painless contusion, but more severe contusions may stay painful and swollen for a few weeks.  °CAUSES  °This condition is usually caused by a blow, trauma, or direct force to an area of the body. °SYMPTOMS  °Symptoms of this condition include: °· Swelling of the injured area. °· Pain and tenderness in the injured area. °· Discoloration. The area may have redness and then turn blue, purple, or yellow. °DIAGNOSIS  °This condition is diagnosed based on a physical exam and medical history. An X-ray, CT scan, or MRI may be needed to determine if there are any associated injuries, such as broken bones (fractures). °TREATMENT  °Specific treatment for this condition depends on what area of the body was injured. In general, the best treatment for a contusion is resting, icing, applying pressure to (compression), and elevating the injured area. This is often called the RICE strategy. Over-the-counter anti-inflammatory medicines may also be recommended for pain control.  °HOME CARE INSTRUCTIONS  °· Rest the injured area. °· If directed, apply ice to the injured area: °¨ Put ice in a plastic bag. °¨ Place a towel between your skin and the bag. °¨ Leave the ice on for 20 minutes, 2-3 times per day. °· If directed, apply light compression to the injured area using an elastic bandage. Make sure the bandage is not wrapped too tightly. Remove and reapply the bandage as directed by your health care provider. °· If possible, raise (elevate) the injured area above the level of your heart while you are sitting or lying down. °· Take over-the-counter and prescription medicines only as told by your health care provider. °SEEK MEDICAL CARE IF: °· Your symptoms do not  improve after several days of treatment. °· Your symptoms get worse. °· You have difficulty moving the injured area. °SEEK IMMEDIATE MEDICAL CARE IF:  °· You have severe pain. °· You have numbness in a hand or foot. °· Your hand or foot turns pale or cold. °  °This information is not intended to replace advice given to you by your health care provider. Make sure you discuss any questions you have with your health care provider. °  °Document Released: 03/16/2005 Document Revised: 02/25/2015 Document Reviewed: 10/22/2014 °Elsevier Interactive Patient Education ©2016 [employer] ° °Facial or Scalp Contusion °A facial or scalp contusion is a deep bruise on the face or head. Injuries to the face and head generally cause a lot of swelling, especially around the eyes. Contusions are the result of an injury that caused bleeding under the skin. The contusion may turn blue, purple, or yellow. Minor injuries will give you a painless contusion, but more severe contusions may stay painful and swollen for a few weeks.  °CAUSES  °A facial or scalp contusion is caused by a blunt injury or trauma to the face or head area.  °SIGNS AND SYMPTOMS  °· Swelling of the injured area.   °· Discoloration of the injured area.   °· Tenderness, soreness, or pain in the injured area.   °DIAGNOSIS  °The diagnosis can be made by taking a medical history and doing a physical exam. An X-ray exam, CT scan, or MRI may be needed to determine if there are any associated injuries, such as broken bones (fractures). °  TREATMENT  °Often, the best treatment for a facial or scalp contusion is applying cold compresses to the injured area. Over-the-counter medicines may also be recommended for pain control.  °HOME CARE INSTRUCTIONS  °· Only take over-the-counter or prescription medicines as directed by your health care provider.   °· Apply ice to the injured area.   °¨ Put ice in a plastic bag.   °¨ Place a towel between your skin and the bag.   °¨ Leave the  ice on for 20 minutes, 2-3 times a day.   °SEEK MEDICAL CARE IF: °· You have bite problems.   °· You have pain with chewing.   °· You are concerned about facial defects. °SEEK IMMEDIATE MEDICAL CARE IF: °· You have severe pain or a headache that is not relieved by medicine.   °· You have unusual sleepiness, confusion, or personality changes.   °· You throw up (vomit).   °· You have a persistent nosebleed.   °· You have double vision or blurred vision.   °· You have fluid drainage from your nose or ear.   °· You have difficulty walking or using your arms or legs.   °MAKE SURE YOU:  °· Understand these instructions. °· Will watch your condition. °· Will get help right away if you are not doing well or get worse. °  °This information is not intended to replace advice given to you by your health care provider. Make sure you discuss any questions you have with your health care provider. °  °Document Released: 07/14/2004 Document Revised: 06/27/2014 Document Reviewed: 01/17/2013 °Elsevier Interactive Patient Education ©2016 [employer] ° °

## 2016-01-18 ENCOUNTER — Emergency Department (HOSPITAL_COMMUNITY)
Admission: EM | Admit: 2016-01-18 | Discharge: 2016-01-18 | Disposition: A | Discharge status: Home / Self Care | Payer: Medicare Other | Attending: Emergency Medicine | Admitting: Emergency Medicine

## 2016-01-18 ENCOUNTER — Emergency Department (HOSPITAL_COMMUNITY): Payer: Medicare Other

## 2016-01-18 ENCOUNTER — Encounter (HOSPITAL_COMMUNITY): Payer: Self-pay

## 2016-01-18 DIAGNOSIS — G309 Alzheimer's disease, unspecified: Secondary | ICD-10-CM | POA: Insufficient documentation

## 2016-01-18 DIAGNOSIS — I110 Hypertensive heart disease with heart failure: Secondary | ICD-10-CM | POA: Diagnosis not present

## 2016-01-18 DIAGNOSIS — Y999 Unspecified external cause status: Secondary | ICD-10-CM | POA: Diagnosis not present

## 2016-01-18 DIAGNOSIS — I2510 Atherosclerotic heart disease of native coronary artery without angina pectoris: Secondary | ICD-10-CM | POA: Diagnosis not present

## 2016-01-18 DIAGNOSIS — I5020 Unspecified systolic (congestive) heart failure: Secondary | ICD-10-CM | POA: Diagnosis not present

## 2016-01-18 DIAGNOSIS — E119 Type 2 diabetes mellitus without complications: Secondary | ICD-10-CM | POA: Diagnosis not present

## 2016-01-18 DIAGNOSIS — Z79899 Other long term (current) drug therapy: Secondary | ICD-10-CM | POA: Insufficient documentation

## 2016-01-18 DIAGNOSIS — S0990XA Unspecified injury of head, initial encounter: Secondary | ICD-10-CM | POA: Diagnosis present

## 2016-01-18 DIAGNOSIS — Y929 Unspecified place or not applicable: Secondary | ICD-10-CM | POA: Diagnosis not present

## 2016-01-18 DIAGNOSIS — Y939 Activity, unspecified: Secondary | ICD-10-CM | POA: Diagnosis not present

## 2016-01-18 NOTE — ED Notes (Signed)
Called report to [place] at facility.  [facility] notified for transport.

## 2016-01-18 NOTE — ED Notes (Signed)
Bed: [facility] Expected date:  Expected time:  Means of arrival:  Comments: 
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
## 2016-02-19 ENCOUNTER — Encounter (HOSPITAL_COMMUNITY): Payer: Self-pay | Admitting: Emergency Medicine

## 2016-02-19 ENCOUNTER — Emergency Department (HOSPITAL_COMMUNITY)
Admission: EM | Admit: 2016-02-19 | Discharge: 2016-02-19 | Disposition: A | Discharge status: Home / Self Care | Payer: Medicare Other | Attending: Emergency Medicine | Admitting: Emergency Medicine

## 2016-02-19 ENCOUNTER — Emergency Department (HOSPITAL_COMMUNITY): Payer: Medicare Other

## 2016-02-19 DIAGNOSIS — G309 Alzheimer's disease, unspecified: Secondary | ICD-10-CM | POA: Insufficient documentation

## 2016-02-19 DIAGNOSIS — E119 Type 2 diabetes mellitus without complications: Secondary | ICD-10-CM | POA: Diagnosis not present

## 2016-02-19 DIAGNOSIS — I5020 Unspecified systolic (congestive) heart failure: Secondary | ICD-10-CM | POA: Insufficient documentation

## 2016-02-19 DIAGNOSIS — R112 Nausea with vomiting, unspecified: Secondary | ICD-10-CM

## 2016-02-19 DIAGNOSIS — I110 Hypertensive heart disease with heart failure: Secondary | ICD-10-CM | POA: Insufficient documentation

## 2016-02-19 DIAGNOSIS — I2510 Atherosclerotic heart disease of native coronary artery without angina pectoris: Secondary | ICD-10-CM | POA: Insufficient documentation

## 2016-02-19 DIAGNOSIS — R4182 Altered mental status, unspecified: Secondary | ICD-10-CM | POA: Diagnosis not present

## 2016-02-19 DIAGNOSIS — R52 Pain, unspecified: Secondary | ICD-10-CM

## 2016-02-19 DIAGNOSIS — R1110 Vomiting, unspecified: Secondary | ICD-10-CM | POA: Diagnosis present

## 2016-02-19 DIAGNOSIS — Z79899 Other long term (current) drug therapy: Secondary | ICD-10-CM | POA: Insufficient documentation

## 2016-02-19 LAB — COMPREHENSIVE METABOLIC PANEL
ALBUMIN: 3.2 g/dL — AB (ref 3.5–5.0)
ALK PHOS: 95 U/L (ref 38–126)
ALT: 12 U/L — ABNORMAL LOW (ref 14–54)
ANION GAP: 7 (ref 5–15)
AST: 19 U/L (ref 15–41)
BILIRUBIN TOTAL: 0.6 mg/dL (ref 0.3–1.2)
BUN: 16 mg/dL (ref 6–20)
CALCIUM: 9 mg/dL (ref 8.9–10.3)
CO2: 29 mmol/L (ref 22–32)
Chloride: 106 mmol/L (ref 101–111)
Creatinine, Ser: 1.19 mg/dL — ABNORMAL HIGH (ref 0.44–1.00)
GFR calc Af Amer: 48 mL/min — ABNORMAL LOW (ref >60)
GFR calc non Af Amer: 41 mL/min — ABNORMAL LOW (ref >60)
GLUCOSE: 104 mg/dL — AB (ref 65–99)
Potassium: 4.2 mmol/L (ref 3.5–5.1)
Sodium: 142 mmol/L (ref 135–145)
TOTAL PROTEIN: 7.7 g/dL (ref 6.5–8.1)

## 2016-02-19 LAB — CBC WITH DIFFERENTIAL/PLATELET
BASOS ABS: 0 10*3/uL (ref 0.0–0.1)
BASOS PCT: 1 %
EOS PCT: 3 %
Eosinophils Absolute: 0.1 10*3/uL (ref 0.0–0.7)
HCT: 31.7 % — ABNORMAL LOW (ref 36.0–46.0)
Hemoglobin: 10.6 g/dL — ABNORMAL LOW (ref 12.0–15.0)
Lymphocytes Relative: 27 %
Lymphs Abs: 1.1 10*3/uL (ref 0.7–4.0)
MCH: 28.6 pg (ref 26.0–34.0)
MCHC: 33.4 g/dL (ref 30.0–36.0)
MCV: 85.4 fL (ref 78.0–100.0)
MONO ABS: 0.4 10*3/uL (ref 0.1–1.0)
Monocytes Relative: 9 %
Neutro Abs: 2.6 10*3/uL (ref 1.7–7.7)
Neutrophils Relative %: 60 %
PLATELETS: 232 10*3/uL (ref 150–400)
RBC: 3.71 MIL/uL — ABNORMAL LOW (ref 3.87–5.11)
RDW: 15.5 % (ref 11.5–15.5)
WBC: 4.3 10*3/uL (ref 4.0–10.5)

## 2016-02-19 LAB — URINALYSIS, ROUTINE W REFLEX MICROSCOPIC
Bilirubin Urine: NEGATIVE
Glucose, UA: NEGATIVE mg/dL
Hgb urine dipstick: NEGATIVE
KETONES UR: NEGATIVE mg/dL
LEUKOCYTES UA: NEGATIVE
NITRITE: NEGATIVE
PROTEIN: NEGATIVE mg/dL
Specific Gravity, Urine: 1.011 (ref 1.005–1.030)
pH: 6 (ref 5.0–8.0)

## 2016-02-19 LAB — AMMONIA: Ammonia: 25 umol/L (ref 9–35)

## 2016-02-19 MED ORDER — SODIUM CHLORIDE 0.9 % IV BOLUS (SEPSIS)
500.0000 mL | Freq: Once | INTRAVENOUS | Status: AC
  Administered 2016-02-19: 500 mL via INTRAVENOUS

## 2016-02-19 MED ORDER — SODIUM CHLORIDE 0.9 % IV BOLUS (SEPSIS): 1000.0000 mL | Freq: Once | INTRAVENOUS | Status: DC

## 2016-02-19 MED ORDER — LORAZEPAM 2 MG/ML IJ SOLN
0.5000 mg | Freq: Once | INTRAMUSCULAR | Status: AC
  Administered 2016-02-19: 0.5 mg via INTRAVENOUS
  Filled 2016-02-19: qty 1
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
## 2016-02-19 NOTE — Progress Notes (Signed)
Patient will be returning to [facility]. CSW notified [facility] that patient would be returning once ready for discharge.   Kingsley Spittle, [place] Clinical Social Worker [phone]

## 2016-02-19 NOTE — Consult Note (Signed)
Hospitalist Service Medical Consultation   Kymberley B [name]  [id number]  DOB: 02-28-1931  DOA: 02/19/2016  PCP: [name], MD    Requesting physician: Sherwood Gambler, MD  Reason for consultation: Possible altered mental status   History of Present Illness: Emily [name] is an 82 y.o. female with past medical history for Alzheimer's with behavioral disturbance, CAD, CHF unknown EF, and HTN who presents with nausea and vomiting.  All history is collected from nursing staff, as the patient's dementia is advanced.    Per report, she had NBNB emesis several times yesterday and had decreased activity level from baseline (described to me on the phone by RN from [facility] as "lethargic", described in EDP notes as "not acting herself" and "not feeding herself which is atypical for her").  Today, she vomited once in the morning, and was still "lethargic" and so staff called her daughter who agreed for ER evaluation.  She had no fever, cough, delirium.   ED course: -Afebrile, heart rate, respirations, blood pressure and pulse oximetry normal -Na 142, K 4.2, Cr 1.2 (baseline 1.1-1.2), transaminases and ammonia normal, WBC 4.3K, Hgb 10.6 (baseline is 11) -Cather collected UA without leukocytes, bacteria or RBCs -CT head normal, known old meningioma, unchanged -Radiographs of the left hip and pelvis showed only degenerative joint disease, no fracture -There was concern by EDP for weakness of left leg, and so TRH were asked to evaluate        Review of Systems:  The patient states, "I feel pretty good".  Hard of hearing and dementia limit ROS, but she denies pain at all, malaise, weakness.  Past Medical History: Past Medical History:  Diagnosis Date  . Alzheimer disease   . CAD (coronary artery disease)    Moderate coronary artery disease status post angioplasty to the right coronary artery in 2003 and an ejection fraction 46% on catheterization in 2006.    . Diabetes mellitus   . Duodenal erosion 2009   EGD by Dr. [name]  . Gastric erosion 2009   EGD by Dr. [name]  . Hypertension   . Osteoarthritis   . Perforated ulcer ([place]) 2002   Perforated pyloric channel ulcer patched by Dr. [name]  . Psoriasis   . Systolic CHF ([place])    EF as low is 45%, but 55% in 2009    Past Surgical History: Past Surgical History:  Procedure Laterality Date  . TOTAL HIP ARTHROPLASTY Right 2007     Allergies:  No Known Allergies   No current facility-administered medications for this encounter.   Current Outpatient Prescriptions:  .  acetaminophen (TYLENOL) 325 MG tablet, Take 1,300 mg by mouth daily., Disp: , Rfl:  .  acetaminophen (TYLENOL) 500 MG tablet, Take 500 mg by mouth every 4 (four) hours as needed for mild pain, fever or headache., Disp: , Rfl:  .  allopurinol (ZYLOPRIM) 300 MG tablet, Take 150 mg by mouth daily., Disp: , Rfl:  .  alum & mag hydroxide-simeth (MINTOX) [id number] MG/5ML suspension, Take 30 mLs by mouth every 6 (six) hours as needed for indigestion or heartburn., Disp: , Rfl:  .  amLODipine (NORVASC) 10 MG tablet, Take 10 mg by mouth daily. , Disp: , Rfl:  .  ammonium lactate (AMLACTIN) 12 % cream, Apply 1 g topically 2 (two) times daily., Disp: , Rfl:  .  bag balm OINT ointment, Apply 1 application topically 2 (two) times  daily., Disp: , Rfl:  .  carvedilol (COREG) 12.5 MG tablet, Take 18.75 mg by mouth 2 (two) times daily with a meal. , Disp: , Rfl:  .  cetirizine (ZYRTEC) 5 MG tablet, Take 5 mg by mouth daily., Disp: , Rfl:  .  cholecalciferol (VITAMIN D) 1000 UNITS tablet, Take 1,000 Units by mouth daily. , Disp: , Rfl:  .  divalproex (DEPAKOTE) 125 MG DR tablet, Take 250 mg by mouth 3 (three) times daily. , Disp: , Rfl:  .  donepezil (ARICEPT) 10 MG tablet, Take 10 mg by mouth at bedtime. , Disp: , Rfl:  .  furosemide (LASIX) 40 MG tablet, Take 40 mg by mouth daily. , Disp: , Rfl:  .  guaifenesin (ROBITUSSIN) 100 MG/5ML  syrup, Take 200 mg by mouth every 6 (six) hours as needed for cough., Disp: , Rfl:  .  LORazepam (ATIVAN) 0.5 MG tablet, Take 0.5 mg by mouth at bedtime., Disp: , Rfl:  .  losartan (COZAAR) 100 MG tablet, Take 100 mg by mouth daily. , Disp: , Rfl:  .  magnesium hydroxide (MILK OF MAGNESIA) 400 MG/5ML suspension, Take 30 mLs by mouth daily as needed for mild constipation., Disp: , Rfl:  .  memantine (NAMENDA XR) 14 MG CP24 24 hr capsule, Take 14 mg by mouth daily., Disp: , Rfl:  .  Menthol, Topical Analgesic, (BIOFREEZE EX), Apply 1 application topically 2 (two) times daily., Disp: , Rfl:  .  mineral oil external liquid, Place 2 drops into both ears 2 (two) times daily., Disp: , Rfl:  .  mirtazapine (REMERON) 15 MG tablet, Take 15 mg by mouth at bedtime., Disp: , Rfl:  .  neomycin-bacitracin-polymyxin (NEOSPORIN) [phone] ointment, Apply 1 application topically daily as needed (minor skin tears of abrasions.)., Disp: , Rfl:  .  risperiDONE (RISPERDAL) 1 MG/ML oral solution, Take 0.5 mg by mouth 2 (two) times daily., Disp: , Rfl:  .  traZODone (DESYREL) 150 MG tablet, Take 150 mg by mouth at bedtime., Disp: , Rfl:  .  loperamide (IMODIUM) 2 MG capsule, Take 2 mg by mouth daily as needed for diarrhea or loose stools., Disp: , Rfl:       Social History: Lives at [place], [place].  Not ambulatory at baseline, per RN "she hasn't been able to walk in a while", despite working with PT.  She is verbal, but per RN at facility, she couldn't hold a conversation or respond to conversation intelligibly at baseline.  Non-smoker.    Family History: Family History  Problem Relation Age of Onset  . Family history unknown: Yes     Physical Exam: Vitals:   02/19/16 1508 02/19/16 1512 02/19/16 1638 02/19/16 1802  BP:  120/89  (!) 150/120  Pulse:  (!) 58  72  Resp:  18  16  Temp:  97.4 F (36.3 C) (!) 95.5 F (35.3 C) 97.4 F (36.3 C)  TempSrc:  Oral Rectal Oral  SpO2: 99% 99%  97%     Constitutional: Awake, makes eye contact, responds normally to verbal stimuli. Not lethargic.  Oriented to self only (uses her maiden name Burnell to introduce self), rambles unintelligibly when asked other orientation questions. Not in any acute rsepiratory distress or discomfort.  "I feel pretty good".   Eyes: PERLA, EOMI, irises appear normal, anicteric sclera,  ENMT: external ears and nose appear normal, hearing poor            Lips appears normal, oropharynx mucosa, tongue, posterior pharynx  appear normal  Neck: neck appears normal, no masses, normal ROM, no thyromegaly, no JVD  CVS: S1-S2 clear, no murmur rubs or gallops, trace LE edema, normal pedal pulses  Respiratory:  Refuses exam, anterior fields without wheezes or rales.  Respiratory effort normal. No accessory muscle use.  GI: soft nontender, nondistended, normal bowel sounds, no hepatosplenomegaly Musculoskeletal: no cyanosis, clubbing noted bilaterally Neuro: Upper extremities 5/5 strength, coordination appear normal.  Lower extremities, does not cooperate with exam with either leg.  I am unable to appreciate either let weakness. Psych: memory severely impaired, affect seems normal, no obvious hallucinations Skin: diffuse scaling, flaking rash/eczema on hands, arms, face, upper chest, legs, other skin exam refused    Data reviewed:  I have personally reviewed following labs and imaging studies Labs:  CBC:  Recent Labs Lab 02/19/16 1639  WBC 4.3  NEUTROABS 2.6  HGB 10.6*  HCT 31.7*  MCV 85.4  PLT [id number]    Basic Metabolic Panel:  Recent Labs Lab 02/19/16 1639  NA 142  K 4.2  CL 106  CO2 29  GLUCOSE 104*  BUN 16  CREATININE 1.19*  CALCIUM 9.0   GFR CrCl cannot be calculated (Unknown ideal weight.). Liver Function Tests:  Recent Labs Lab 02/19/16 1639  AST 19  ALT 12*  ALKPHOS 95  BILITOT 0.6  PROT 7.7  ALBUMIN 3.2*   Recent Labs Lab 02/19/16 1626  AMMONIA 25   Urinalysis    Component  Value Date/Time   COLORURINE YELLOW 02/19/2016 1600   APPEARANCEUR CLEAR 02/19/2016 1600   LABSPEC 1.011 02/19/2016 1600   PHURINE 6.0 02/19/2016 1600   GLUCOSEU NEGATIVE 02/19/2016 1600   HGBUR NEGATIVE 02/19/2016 1600   BILIRUBINUR NEGATIVE 02/19/2016 1600   KETONESUR NEGATIVE 02/19/2016 1600   PROTEINUR NEGATIVE 02/19/2016 1600   UROBILINOGEN 0.2 03/25/2015 0930   NITRITE NEGATIVE 02/19/2016 1600   LEUKOCYTESUR NEGATIVE 02/19/2016 1600         Inpatient Medications:   Scheduled Meds: . LORazepam  0.5 mg Intravenous Once   Continuous Infusions: . sodium chloride       Radiological Exams on Admission: Dg Pelvis 1-2 Views  Result Date: 02/19/2016 CLINICAL DATA:  Left hip pain, no known injury EXAM: PELVIS - 1-2 VIEW COMPARISON:  None. FINDINGS: No fracture or dislocation is seen. Mild degenerative changes of the left hip. Right hip arthroplasty, without evidence of complication. Visualized bony pelvis appears intact. Vascular calcifications. IMPRESSION: Right hip arthroplasty, without evidence of complication. Mild degenerative changes of the left hip. Electronically Signed   By: Julian Hy M.D.   On: 02/19/2016 17:27   Ct Head Wo Contrast  Result Date: 02/19/2016 CLINICAL DATA:  82 year old female with headache. Vomiting today. Frequent falls. Initial encounter. EXAM: CT HEAD WITHOUT CONTRAST TECHNIQUE: Contiguous axial images were obtained from the base of the skull through the vertex without intravenous contrast. COMPARISON:  Head CT without contrast 01/18/2016 and earlier. FINDINGS: Brain: Chronic fairly densely calcified right frontal convexity meningioma near the anterior cranial fossa remain stable. Mild regional mass effect is stable. There does appear to be mild white matter edema in the adjacent right inferior frontal gyrus (series 2, image 12), but this has been present since 2016 and does not appear progressed. No other intracranial mass lesion or mass effect.  Stable cerebral volume. Stable ventricle size and configuration. Bilateral temporal lobe volume loss. Stable gray-white matter differentiation throughout the brain. No cortically based acute infarct identified. No acute intracranial hemorrhage identified. Vascular: Extensive Calcified atherosclerosis  at the skull base. Skull: Stable visualized osseous structures. Sinuses/Orbits: Visualized paranasal sinuses and mastoids are stable and well pneumatized. Other: No acute orbit or scalp soft tissue findings. IMPRESSION: 1.  No acute intracranial abnormality. 2. Stable noncontrast CT appearance of the brain, including an anterior right frontal convexity 2.7 cm meningioma with mild regional mass effect and edema which appears stable since 2016. Electronically Signed   By: Genevie Ann M.D.   On: 02/19/2016 17:13   Dg Femur Min 2 Views Left  Result Date: 02/19/2016 CLINICAL DATA:  Left hip pain without injury EXAM: LEFT FEMUR 2 VIEWS COMPARISON:  None. FINDINGS: No fracture or dislocation is seen. Mild degenerative changes of the left hip. Moderate degenerative changes of the left knee. Visualized soft tissues are notable for vascular calcifications. IMPRESSION: No fracture or dislocation is seen. Mild degenerative changes of the left hip. Moderate degenerative changes of the left knee. Electronically Signed   By: Julian Hy M.D.   On: 02/19/2016 17:28    Assessment: 1. Nausea/vomiting and decreased activity: Electrolytes and renal function normal, no leukocytosis.  Stable anemia.  Abdominal exam benign and vital signs WNL.  Regarding serious causes of N/V, there is no abdominal pain to suggest SBO, no evidence of systemic infection.  Her only abnormal physiologic parameter that I see documented is a slightly low rectal temp, although this is inconsistent with a normal oral temperature (would expect that peripheral temp to be lower than the core temp, also she is not clinically cold or lethargic at all to me).   This appears to be a self-limited vomiting episodes, possibly now she is mildly dehydrated.  2. Possible leg weakness Her exam is difficult.  In light of EDP concerns, I recommend light sedation and MRI brain.   3. Alzheimer's This appears severe.  She is on Namenda and Aricept and also appears to be prescribed Depakote and Risperdal, at greater than average doses.  High risk for delirium. -Per nursing report from her facility, she appears to be mentally at her baseline  4. Anemia: This is chronic, normocytic, likely from CKD.  Stable.     Plan: -I recommend gentle IVF -MRI is appropriate given EDP exam -If MRI abnormal, admission for stroke work up, otherwise return to facility with return precautions       Thank you for this consultation.        Edwin Dada M.D. [facility] 02/19/2016, 8:02 PM

## 2016-02-19 NOTE — ED Notes (Signed)
Pt observed sitting at foot of bed pulling off gown and EKG leads. Pt repositioned in bed laying with feet elevated and warm blankets given for comfort.   SIDE RAILS UP WITH BEDSIDE TABLE POSITIONED IN FRONT OF PT. WASHCLOTHS ON BEDSIDE TABLE FOR ACTIVITY. PT HAS FALL RISK BAND AND YELLOW SOCKS.

## 2016-02-19 NOTE — ED Provider Notes (Signed)
[facility] DEPT Provider Note   CSN: [id number] Arrival date & time: 02/19/16  1504   LEVEL 5 CAVEAT - DEMENTIA  History   Chief Complaint Chief Complaint  Patient presents with  . Emesis    HPI Emily [name] is a 82 y.o. female presenting from her nursing home with altered mental status. The patient has dementia and is completely unable to provide a history. She tells me that she feels fine. The nurse at the facility states that the patient has been lethargic since yesterday. She vomited 3 or 4 times yesterday without diarrhea. She has not had any specific complaints but seems to not be acting herself. She is not feeding herself which is atypical for her. She typically answers yes no questions and knows her name but otherwise does not typically carry on a conversation. She had a progressive decline in walking and now only walks a few steps with a walker over the last couple months. Typically she is in a wheelchair. No known falls recently. No known fevers. No vomiting today. The facility updated the daughter who requested she come to the ER. When asked, the patient states that she does have a headache currently. Denies any other pains.  HPI  Past Medical History:  Diagnosis Date  . Alzheimer disease   . CAD (coronary artery disease)    Moderate coronary artery disease status post angioplasty to the right coronary artery in 2003 and an ejection fraction 46% on catheterization in 2006.  . Diabetes mellitus   . Duodenal erosion 2009   EGD by Dr. [name]  . Gastric erosion 2009   EGD by Dr. [name]  . Hypertension   . Osteoarthritis   . Perforated ulcer ([place]) 2002   Perforated pyloric channel ulcer patched by Dr. [name]  . Psoriasis   . Systolic CHF ([place])    EF as low is 45%, but 55% in 2009    Patient Active Problem List   Diagnosis Date Noted  . Acute GI bleeding 03/04/2013  . MICROCYTOSIS 07/29/2009  . DIVERTICULOSIS, COLON 09/14/2007  . DUODENITIS, WITHOUT HEMORRHAGE  08/17/2007  . CHEST PAIN 08/15/2007  . ADENOMATOUS COLONIC POLYP 05/31/2007  . CORONARY ARTERY DISEASE 05/31/2007  . HYPERLIPIDEMIA 08/09/2006  . DIABETES MELLITUS, TYPE II 06/29/2006  . HYPERCHOLESTEROLEMIA 06/29/2006  . OBESITY 06/29/2006  . HYPERTENSION 06/29/2006  . PEPTIC ULCER DISEASE 06/29/2006  . PSORIASIS 06/29/2006  . DEGENERATIVE JOINT DISEASE 06/29/2006  . SYMPTOM, MEMORY LOSS 06/29/2006  . WEIGHT LOSS 06/29/2006  . DEPRESSION, HX OF 06/29/2006  . HIP REPLACEMENT, TOTAL, HX OF 06/29/2006  . HYSTERECTOMY, HX OF 06/29/2006  . PERCUTANEOUS TRANSLUMINAL CORONARY ANGIOPLASTY, HX OF 06/29/2006  . CARPAL TUNNEL RELEASE, HX OF 06/29/2006    Past Surgical History:  Procedure Laterality Date  . TOTAL HIP ARTHROPLASTY Right 2007    OB History    No data available       Home Medications    Prior to Admission medications   Medication Sig Start Date End Date Taking? Authorizing Provider  acetaminophen (TYLENOL) 325 MG tablet Take 1,300 mg by mouth daily.   Yes Historical Provider, MD  acetaminophen (TYLENOL) 500 MG tablet Take 500 mg by mouth every 4 (four) hours as needed for mild pain, fever or headache.   Yes Historical Provider, MD  allopurinol (ZYLOPRIM) 300 MG tablet Take 150 mg by mouth daily.   Yes Historical Provider, MD  alum & mag hydroxide-simeth (MINTOX) 200-200-20 MG/5ML suspension Take 30 mLs by mouth every 6 (  six) hours as needed for indigestion or heartburn.   Yes Historical Provider, MD  amLODipine (NORVASC) 10 MG tablet Take 10 mg by mouth daily.    Yes Historical Provider, MD  ammonium lactate (AMLACTIN) 12 % cream Apply 1 g topically 2 (two) times daily.   Yes Historical Provider, MD  bag balm OINT ointment Apply 1 application topically 2 (two) times daily.   Yes Historical Provider, MD  carvedilol (COREG) 12.5 MG tablet Take 18.75 mg by mouth 2 (two) times daily with a meal.  10/12/10  Yes [name], MD  cetirizine (ZYRTEC) 5 MG tablet Take 5 mg by  mouth daily.   Yes Historical Provider, MD  cholecalciferol (VITAMIN D) 1000 UNITS tablet Take 1,000 Units by mouth daily.    Yes Historical Provider, MD  divalproex (DEPAKOTE) 125 MG DR tablet Take 250 mg by mouth 3 (three) times daily.    Yes Historical Provider, MD  donepezil (ARICEPT) 10 MG tablet Take 10 mg by mouth at bedtime.    Yes Historical Provider, MD  furosemide (LASIX) 40 MG tablet Take 40 mg by mouth daily.    Yes Historical Provider, MD  guaifenesin (ROBITUSSIN) 100 MG/5ML syrup Take 200 mg by mouth every 6 (six) hours as needed for cough.   Yes Historical Provider, MD  LORazepam (ATIVAN) 0.5 MG tablet Take 0.5 mg by mouth at bedtime.   Yes Historical Provider, MD  losartan (COZAAR) 100 MG tablet Take 100 mg by mouth daily.    Yes Historical Provider, MD  magnesium hydroxide (MILK OF MAGNESIA) 400 MG/5ML suspension Take 30 mLs by mouth daily as needed for mild constipation.   Yes Historical Provider, MD  memantine (NAMENDA XR) 14 MG CP24 24 hr capsule Take 14 mg by mouth daily.   Yes Historical Provider, MD  Menthol, Topical Analgesic, (BIOFREEZE EX) Apply 1 application topically 2 (two) times daily.   Yes Historical Provider, MD  mineral oil external liquid Place 2 drops into both ears 2 (two) times daily.   Yes Historical Provider, MD  mirtazapine (REMERON) 15 MG tablet Take 15 mg by mouth at bedtime.   Yes Historical Provider, MD  neomycin-bacitracin-polymyxin (NEOSPORIN) [phone] ointment Apply 1 application topically daily as needed (minor skin tears of abrasions.).   Yes Historical Provider, MD  risperiDONE (RISPERDAL) 1 MG/ML oral solution Take 0.5 mg by mouth 2 (two) times daily.   Yes Historical Provider, MD  traZODone (DESYREL) 150 MG tablet Take 150 mg by mouth at bedtime.   Yes Historical Provider, MD  loperamide (IMODIUM) 2 MG capsule Take 2 mg by mouth daily as needed for diarrhea or loose stools.    Historical Provider, MD    Family History Family History    Problem Relation Age of Onset  . Family history unknown: Yes    Social History Social History  Substance Use Topics  . Smoking status: Never Smoker  . Smokeless tobacco: Not on file  . Alcohol use No     Allergies   Review of patient's allergies indicates no known allergies.   Review of Systems Review of Systems  Unable to perform ROS: Dementia     Physical Exam Updated Vital Signs BP 171/92 (BP Location: Left Arm)   Pulse 65   Temp 97.4 F (36.3 C) (Oral)   Resp 12   SpO2 94%   Physical Exam  Constitutional: She appears well-developed and well-nourished. No distress.  HENT:  Head: Normocephalic and atraumatic.  Right Ear: External ear normal.  Left Ear: External ear normal.  Nose: Nose normal.  Eyes: Right eye exhibits no discharge. Left eye exhibits no discharge.  Cardiovascular: Normal rate, regular rhythm and normal heart sounds.   Pulmonary/Chest: Effort normal and breath sounds normal.  Abdominal: Soft. There is no tenderness.  Musculoskeletal:       Left hip: She exhibits normal range of motion and no tenderness.       Left knee: No tenderness found.       Left upper leg: She exhibits no tenderness.  Neurological: She is alert. She is disoriented.  Awake, alert, confused. Thinks she's at home. Strength appears equal in upper extremities but she participates poorly in neuro exam. No facial droop. When lifting right leg up off bed she can hold it up. When lifting left she seems to have pain and points to her distal thigh/upper knee.  Skin: Skin is warm and dry. She is not diaphoretic.  Nursing note and vitals reviewed.    ED Treatments / Results  Labs (all labs ordered are listed, but only abnormal results are displayed) Labs Reviewed  COMPREHENSIVE METABOLIC PANEL - Abnormal; Notable for the following:       Result Value   Glucose, Bld 104 (*)    Creatinine, Ser 1.19 (*)    Albumin 3.2 (*)    ALT 12 (*)    GFR calc non Af Amer 41 (*)    GFR  calc Af Amer 48 (*)    All other components within normal limits  CBC WITH DIFFERENTIAL/PLATELET - Abnormal; Notable for the following:    RBC 3.71 (*)    Hemoglobin 10.6 (*)    HCT 31.7 (*)    All other components within normal limits  URINALYSIS, ROUTINE W REFLEX MICROSCOPIC (NOT AT [facility])  AMMONIA    EKG  EKG Interpretation  Date/Time:  Friday February 19 2016 16:42:22 EDT Ventricular Rate:  65 PR Interval:    QRS Duration: 113 QT Interval:  452 QTC Calculation: 470 R Axis:   -12 Text Interpretation:  Sinus rhythm Left ventricular hypertrophy Inferior infarct, age indeterminate Lateral leads are also involved Baseline wander in lead(s) V4 similar to June 2017 Reconfirmed by [facility] MD, Cyriah Childrey [phone]) on 02/19/2016 5:59:22 PM       Radiology Dg Pelvis 1-2 Views  Result Date: 02/19/2016 CLINICAL DATA:  Left hip pain, no known injury EXAM: PELVIS - 1-2 VIEW COMPARISON:  None. FINDINGS: No fracture or dislocation is seen. Mild degenerative changes of the left hip. Right hip arthroplasty, without evidence of complication. Visualized bony pelvis appears intact. Vascular calcifications. IMPRESSION: Right hip arthroplasty, without evidence of complication. Mild degenerative changes of the left hip. Electronically Signed   By: Julian Hy M.D.   On: 02/19/2016 17:27   Ct Head Wo Contrast  Result Date: 02/19/2016 CLINICAL DATA:  83 year old female with headache. Vomiting today. Frequent falls. Initial encounter. EXAM: CT HEAD WITHOUT CONTRAST TECHNIQUE: Contiguous axial images were obtained from the base of the skull through the vertex without intravenous contrast. COMPARISON:  Head CT without contrast 01/18/2016 and earlier. FINDINGS: Brain: Chronic fairly densely calcified right frontal convexity meningioma near the anterior cranial fossa remain stable. Mild regional mass effect is stable. There does appear to be mild white matter edema in the adjacent right inferior frontal gyrus  (series 2, image 12), but this has been present since 2016 and does not appear progressed. No other intracranial mass lesion or mass effect. Stable cerebral volume. Stable ventricle size and configuration.  Bilateral temporal lobe volume loss. Stable gray-white matter differentiation throughout the brain. No cortically based acute infarct identified. No acute intracranial hemorrhage identified. Vascular: Extensive Calcified atherosclerosis at the skull base. Skull: Stable visualized osseous structures. Sinuses/Orbits: Visualized paranasal sinuses and mastoids are stable and well pneumatized. Other: No acute orbit or scalp soft tissue findings. IMPRESSION: 1.  No acute intracranial abnormality. 2. Stable noncontrast CT appearance of the brain, including an anterior right frontal convexity 2.7 cm meningioma with mild regional mass effect and edema which appears stable since 2016. Electronically Signed   By: Genevie Ann M.D.   On: 02/19/2016 17:13   Dg Femur Min 2 Views Left  Result Date: 02/19/2016 CLINICAL DATA:  Left hip pain without injury EXAM: LEFT FEMUR 2 VIEWS COMPARISON:  None. FINDINGS: No fracture or dislocation is seen. Mild degenerative changes of the left hip. Moderate degenerative changes of the left knee. Visualized soft tissues are notable for vascular calcifications. IMPRESSION: No fracture or dislocation is seen. Mild degenerative changes of the left hip. Moderate degenerative changes of the left knee. Electronically Signed   By: Julian Hy M.D.   On: 02/19/2016 17:28    Procedures Procedures (including critical care time)  Medications Ordered in ED Medications  sodium chloride 0.9 % bolus 500 mL (0 mLs Intravenous Stopped 02/19/16 2200)  LORazepam (ATIVAN) injection 0.5 mg (0.5 mg Intravenous Given 02/19/16 2022)     Initial Impression / Assessment and Plan / ED Course  I have reviewed the triage vital signs and the nursing notes.  Pertinent labs & imaging results that were  available during my care of the patient were reviewed by me and considered in my medical decision making (see chart for details).  Clinical Course  Comment By Time  Will do AMS workup including CT head given history of falls. Left leg seems weaker than right but also painful, will xray hip. However her effort and cooperation is so poor from dementia it's hard to get a good neuro exam.  Sherwood Gambler, MD 09/01 1609  D/w Dr. Loleta Books. He will come see, asks for IV fluid bolus and MRI brain. I called daughter on all the available numbers but no answer Sherwood Gambler, MD 09/01 1939    Patient's mental status has remained stable. She remains very confused but this appears to be her chronic dementia. On reexamination after a failed MRI attempt she is moving her left lower extremity more. She has good strength in it, I think earlier exam findings were likely related to poor cooperation with the exam. At times she would move either leg. Had a discussion with the daughter who is now the bedside. After discussion, even if patient had a stroke, she's not sure how much she would want her admitted or having severe medication changes. Would like for her to go back to the facility. I think this is very reasonable, there is currently no acute or obvious finding is for why the patient is acting a little different and why she had vomiting yesterday. Discussed return precautions, discharge back to facility.  Final Clinical Impressions(s) / ED Diagnoses   Final diagnoses:  Altered mental status, unspecified altered mental status type    New Prescriptions New Prescriptions   No medications on file     Sherwood Gambler, MD 02/19/16 2314

## 2016-02-19 NOTE — ED Notes (Signed)
Bed: WA07 Expected date:  Expected time:  Means of arrival:  Comments:  82 yo N/V

## 2016-02-19 NOTE — ED Notes (Signed)
Patient was combative in MRI. MRI could not be done. MD notified

## 2016-04-09 ENCOUNTER — Inpatient Hospital Stay (HOSPITAL_COMMUNITY)
Admission: EM | Admit: 2016-04-09 | Discharge: 2016-04-16 | DRG: 871 | Disposition: A | Discharge status: Skilled Nursing Facility | Payer: Medicare Other | Attending: Family Medicine | Admitting: Family Medicine

## 2016-04-09 ENCOUNTER — Inpatient Hospital Stay (HOSPITAL_COMMUNITY): Payer: Medicare Other

## 2016-04-09 ENCOUNTER — Encounter (HOSPITAL_COMMUNITY): Payer: Self-pay | Admitting: Emergency Medicine

## 2016-04-09 ENCOUNTER — Emergency Department (HOSPITAL_COMMUNITY): Payer: Medicare Other

## 2016-04-09 DIAGNOSIS — R6521 Severe sepsis with septic shock: Secondary | ICD-10-CM | POA: Diagnosis present

## 2016-04-09 DIAGNOSIS — E876 Hypokalemia: Secondary | ICD-10-CM | POA: Diagnosis present

## 2016-04-09 DIAGNOSIS — F0280 Dementia in other diseases classified elsewhere without behavioral disturbance: Secondary | ICD-10-CM | POA: Diagnosis present

## 2016-04-09 DIAGNOSIS — E1122 Type 2 diabetes mellitus with diabetic chronic kidney disease: Secondary | ICD-10-CM | POA: Diagnosis present

## 2016-04-09 DIAGNOSIS — N179 Acute kidney failure, unspecified: Secondary | ICD-10-CM | POA: Diagnosis present

## 2016-04-09 DIAGNOSIS — G9340 Encephalopathy, unspecified: Secondary | ICD-10-CM | POA: Diagnosis present

## 2016-04-09 DIAGNOSIS — G309 Alzheimer's disease, unspecified: Secondary | ICD-10-CM | POA: Diagnosis present

## 2016-04-09 DIAGNOSIS — Z515 Encounter for palliative care: Secondary | ICD-10-CM

## 2016-04-09 DIAGNOSIS — N182 Chronic kidney disease, stage 2 (mild): Secondary | ICD-10-CM | POA: Diagnosis present

## 2016-04-09 DIAGNOSIS — Z66 Do not resuscitate: Secondary | ICD-10-CM | POA: Diagnosis present

## 2016-04-09 DIAGNOSIS — J9602 Acute respiratory failure with hypercapnia: Secondary | ICD-10-CM | POA: Diagnosis present

## 2016-04-09 DIAGNOSIS — Z7189 Other specified counseling: Secondary | ICD-10-CM

## 2016-04-09 DIAGNOSIS — A419 Sepsis, unspecified organism: Secondary | ICD-10-CM | POA: Diagnosis present

## 2016-04-09 DIAGNOSIS — E860 Dehydration: Secondary | ICD-10-CM | POA: Diagnosis present

## 2016-04-09 DIAGNOSIS — Z96641 Presence of right artificial hip joint: Secondary | ICD-10-CM | POA: Diagnosis present

## 2016-04-09 DIAGNOSIS — Z9861 Coronary angioplasty status: Secondary | ICD-10-CM

## 2016-04-09 DIAGNOSIS — E1129 Type 2 diabetes mellitus with other diabetic kidney complication: Secondary | ICD-10-CM | POA: Diagnosis present

## 2016-04-09 DIAGNOSIS — R680 Hypothermia, not associated with low environmental temperature: Secondary | ICD-10-CM | POA: Diagnosis present

## 2016-04-09 DIAGNOSIS — E870 Hyperosmolality and hypernatremia: Secondary | ICD-10-CM | POA: Diagnosis present

## 2016-04-09 DIAGNOSIS — T68XXXA Hypothermia, initial encounter: Secondary | ICD-10-CM

## 2016-04-09 DIAGNOSIS — I959 Hypotension, unspecified: Secondary | ICD-10-CM | POA: Diagnosis present

## 2016-04-09 DIAGNOSIS — Z8711 Personal history of peptic ulcer disease: Secondary | ICD-10-CM

## 2016-04-09 DIAGNOSIS — R001 Bradycardia, unspecified: Secondary | ICD-10-CM | POA: Diagnosis present

## 2016-04-09 DIAGNOSIS — J9601 Acute respiratory failure with hypoxia: Secondary | ICD-10-CM

## 2016-04-09 DIAGNOSIS — J189 Pneumonia, unspecified organism: Secondary | ICD-10-CM

## 2016-04-09 DIAGNOSIS — Y95 Nosocomial condition: Secondary | ICD-10-CM | POA: Diagnosis present

## 2016-04-09 DIAGNOSIS — I2510 Atherosclerotic heart disease of native coronary artery without angina pectoris: Secondary | ICD-10-CM | POA: Diagnosis present

## 2016-04-09 DIAGNOSIS — D6959 Other secondary thrombocytopenia: Secondary | ICD-10-CM | POA: Diagnosis present

## 2016-04-09 DIAGNOSIS — D649 Anemia, unspecified: Secondary | ICD-10-CM | POA: Diagnosis present

## 2016-04-09 DIAGNOSIS — I130 Hypertensive heart and chronic kidney disease with heart failure and stage 1 through stage 4 chronic kidney disease, or unspecified chronic kidney disease: Secondary | ICD-10-CM | POA: Diagnosis present

## 2016-04-09 DIAGNOSIS — Z993 Dependence on wheelchair: Secondary | ICD-10-CM

## 2016-04-09 DIAGNOSIS — R0902 Hypoxemia: Secondary | ICD-10-CM

## 2016-04-09 DIAGNOSIS — E035 Myxedema coma: Secondary | ICD-10-CM | POA: Diagnosis present

## 2016-04-09 DIAGNOSIS — I5022 Chronic systolic (congestive) heart failure: Secondary | ICD-10-CM | POA: Diagnosis present

## 2016-04-09 DIAGNOSIS — I10 Essential (primary) hypertension: Secondary | ICD-10-CM | POA: Diagnosis present

## 2016-04-09 DIAGNOSIS — K279 Peptic ulcer, site unspecified, unspecified as acute or chronic, without hemorrhage or perforation: Secondary | ICD-10-CM | POA: Diagnosis present

## 2016-04-09 DIAGNOSIS — Z79899 Other long term (current) drug therapy: Secondary | ICD-10-CM

## 2016-04-09 HISTORY — DX: Chronic kidney disease, unspecified: N18.9

## 2016-04-09 LAB — BLOOD GAS, VENOUS
ACID-BASE EXCESS: 3 mmol/L — AB (ref 0.0–2.0)
BICARBONATE: 28.8 mmol/L — AB (ref 20.0–28.0)
FIO2: 0.21
O2 Saturation: 85.9 %
PH VEN: 7.361 (ref 7.250–7.430)
PO2 VEN: 57.5 mmHg — AB (ref 32.0–45.0)
Patient temperature: 98.6
pCO2, Ven: 52.1 mmHg (ref 44.0–60.0)

## 2016-04-09 LAB — COMPREHENSIVE METABOLIC PANEL
ALBUMIN: 3.1 g/dL — AB (ref 3.5–5.0)
ALT: 13 U/L — ABNORMAL LOW (ref 14–54)
AST: 18 U/L (ref 15–41)
Alkaline Phosphatase: 93 U/L (ref 38–126)
Anion gap: 9 (ref 5–15)
BUN: 22 mg/dL — AB (ref 6–20)
CHLORIDE: 109 mmol/L (ref 101–111)
CO2: 29 mmol/L (ref 22–32)
Calcium: 9.1 mg/dL (ref 8.9–10.3)
Creatinine, Ser: 1.7 mg/dL — ABNORMAL HIGH (ref 0.44–1.00)
GFR calc Af Amer: 31 mL/min — ABNORMAL LOW (ref >60)
GFR calc non Af Amer: 27 mL/min — ABNORMAL LOW (ref >60)
GLUCOSE: 84 mg/dL (ref 65–99)
POTASSIUM: 3.7 mmol/L (ref 3.5–5.1)
Sodium: 147 mmol/L — ABNORMAL HIGH (ref 135–145)
Total Bilirubin: 0.6 mg/dL (ref 0.3–1.2)
Total Protein: 7.5 g/dL (ref 6.5–8.1)

## 2016-04-09 LAB — CBC WITH DIFFERENTIAL/PLATELET
Basophils Absolute: 0 10*3/uL (ref 0.0–0.1)
Basophils Relative: 0 %
EOS ABS: 0 10*3/uL (ref 0.0–0.7)
EOS PCT: 0 %
HEMATOCRIT: 30.6 % — AB (ref 36.0–46.0)
Hemoglobin: 10.2 g/dL — ABNORMAL LOW (ref 12.0–15.0)
LYMPHS PCT: 9 %
Lymphs Abs: 0.7 10*3/uL (ref 0.7–4.0)
MCH: 28.7 pg (ref 26.0–34.0)
MCHC: 33.3 g/dL (ref 30.0–36.0)
MCV: 86 fL (ref 78.0–100.0)
MONO ABS: 0.4 10*3/uL (ref 0.1–1.0)
Monocytes Relative: 5 %
NEUTROS PCT: 86 %
Neutro Abs: 7.2 10*3/uL (ref 1.7–7.7)
PLATELETS: 98 10*3/uL — AB (ref 150–400)
RBC: 3.56 MIL/uL — AB (ref 3.87–5.11)
RDW: 15.5 % (ref 11.5–15.5)
WBC: 8.3 10*3/uL (ref 4.0–10.5)

## 2016-04-09 LAB — URINALYSIS, ROUTINE W REFLEX MICROSCOPIC
GLUCOSE, UA: NEGATIVE mg/dL
HGB URINE DIPSTICK: NEGATIVE
Ketones, ur: NEGATIVE mg/dL
Leukocytes, UA: NEGATIVE
Nitrite: NEGATIVE
Protein, ur: NEGATIVE mg/dL
SPECIFIC GRAVITY, URINE: 1.019 (ref 1.005–1.030)
pH: 5.5 (ref 5.0–8.0)

## 2016-04-09 LAB — LACTIC ACID, PLASMA: LACTIC ACID, VENOUS: 1.1 mmol/L (ref 0.5–1.9)

## 2016-04-09 LAB — I-STAT CG4 LACTIC ACID, ED
Lactic Acid, Venous: 1.39 mmol/L (ref 0.5–1.9)
Lactic Acid, Venous: 0.45 mmol/L — ABNORMAL LOW (ref 0.5–1.9)

## 2016-04-09 LAB — GLUCOSE, CAPILLARY: GLUCOSE-CAPILLARY: 98 mg/dL (ref 65–99)

## 2016-04-09 LAB — PROCALCITONIN

## 2016-04-09 LAB — APTT: aPTT: 61 seconds — ABNORMAL HIGH (ref 24–36)

## 2016-04-09 LAB — CBG MONITORING, ED: Glucose-Capillary: 80 mg/dL (ref 65–99)

## 2016-04-09 LAB — PROTIME-INR
INR: 1.3
Prothrombin Time: 16.3 seconds — ABNORMAL HIGH (ref 11.4–15.2)

## 2016-04-09 LAB — TSH: TSH: 7.602 u[IU]/mL — AB (ref 0.350–4.500)

## 2016-04-09 MED ORDER — INSULIN ASPART 100 UNIT/ML ~~LOC~~ SOLN
0.0000 [IU] | SUBCUTANEOUS | Status: DC
  Administered 2016-04-10: 1 [IU] via SUBCUTANEOUS

## 2016-04-09 MED ORDER — SODIUM CHLORIDE 0.9 % IV BOLUS (SEPSIS): 1000.0000 mL | Freq: Once | INTRAVENOUS | Status: DC

## 2016-04-09 MED ORDER — ACETAMINOPHEN 325 MG PO TABS: 650.0000 mg | ORAL_TABLET | Freq: Four times a day (QID) | ORAL | Status: DC | PRN

## 2016-04-09 MED ORDER — SODIUM CHLORIDE 0.9 % IV BOLUS (SEPSIS)
1000.0000 mL | Freq: Once | INTRAVENOUS | Status: AC
  Administered 2016-04-09: 1000 mL via INTRAVENOUS

## 2016-04-09 MED ORDER — LEVOTHYROXINE SODIUM 100 MCG IV SOLR
100.0000 ug | Freq: Every day | INTRAVENOUS | Status: DC
  Administered 2016-04-09 – 2016-04-11 (×3): 100 ug via INTRAVENOUS
  Filled 2016-04-09 (×4): qty 5

## 2016-04-09 MED ORDER — ONDANSETRON HCL 4 MG/2ML IJ SOLN: 4.0000 mg | Freq: Four times a day (QID) | INTRAMUSCULAR | Status: DC | PRN

## 2016-04-09 MED ORDER — ONDANSETRON HCL 4 MG PO TABS: 4.0000 mg | ORAL_TABLET | Freq: Four times a day (QID) | ORAL | Status: DC | PRN

## 2016-04-09 MED ORDER — SODIUM CHLORIDE 0.9% FLUSH
3.0000 mL | Freq: Two times a day (BID) | INTRAVENOUS | Status: DC
  Administered 2016-04-09 – 2016-04-15 (×10): 3 mL via INTRAVENOUS

## 2016-04-09 MED ORDER — PIPERACILLIN-TAZOBACTAM 3.375 G IVPB
3.3750 g | Freq: Three times a day (TID) | INTRAVENOUS | Status: DC
  Administered 2016-04-10 – 2016-04-12 (×8): 3.375 g via INTRAVENOUS
  Filled 2016-04-09 (×7): qty 50

## 2016-04-09 MED ORDER — VANCOMYCIN HCL 10 G IV SOLR
1250.0000 mg | INTRAVENOUS | Status: DC
  Filled 2016-04-09: qty 1250

## 2016-04-09 MED ORDER — PIPERACILLIN-TAZOBACTAM 3.375 G IVPB 30 MIN
3.3750 g | Freq: Once | INTRAVENOUS | Status: AC
  Administered 2016-04-09: 3.375 g via INTRAVENOUS
  Filled 2016-04-09: qty 50

## 2016-04-09 MED ORDER — VANCOMYCIN HCL IN DEXTROSE 1-5 GM/200ML-% IV SOLN
1000.0000 mg | Freq: Once | INTRAVENOUS | Status: AC
  Administered 2016-04-09: 1000 mg via INTRAVENOUS
  Filled 2016-04-09: qty 200

## 2016-04-09 MED ORDER — SODIUM CHLORIDE 0.9 % IV SOLN
INTRAVENOUS | Status: DC
  Administered 2016-04-09: 100 mL via INTRAVENOUS

## 2016-04-09 MED ORDER — HYDROCORTISONE NA SUCCINATE PF 100 MG IJ SOLR
100.0000 mg | Freq: Three times a day (TID) | INTRAMUSCULAR | Status: DC
  Administered 2016-04-09 – 2016-04-12 (×8): 100 mg via INTRAVENOUS
  Filled 2016-04-09 (×8): qty 2

## 2016-04-09 MED ORDER — ATROPINE SULFATE 1 MG/10ML IJ SOSY
0.5000 mg | PREFILLED_SYRINGE | Freq: Once | INTRAMUSCULAR | Status: AC
  Administered 2016-04-09: 0.5 mg via INTRAVENOUS
  Filled 2016-04-09: qty 10

## 2016-04-09 MED ORDER — ACETAMINOPHEN 650 MG RE SUPP: 650.0000 mg | Freq: Four times a day (QID) | RECTAL | Status: DC | PRN

## 2016-04-09 NOTE — ED Notes (Signed)
Belfi MD at bedside. 

## 2016-04-09 NOTE — ED Notes (Signed)
MAR from facility says pt has living will and DNR; neither sent with paperwork.

## 2016-04-09 NOTE — ED Notes (Signed)
IV tubing broke during CT scan, pt returned to room and new tubing applied with antibiotics running.

## 2016-04-09 NOTE — ED Notes (Signed)
Hospitalist at bedside aware of decreasing SpO2

## 2016-04-09 NOTE — ED Notes (Signed)
Bed: [id number] Expected date:  Expected time:  Means of arrival:  Comments: EMS- 82yo F, cough/worsening dementia

## 2016-04-09 NOTE — ED Notes (Signed)
Per Pharmacy, second bag of Vanc should be hung as ordered

## 2016-04-09 NOTE — H&P (Addendum)
Emily [name] [id number] DOB: 29-Apr-1931 DOA: 04/09/2016     PCP: [name], MD   Outpatient Specialists:  none   Patient coming from:  From facility  [facility]   Chief Complaint: Worsening confusion  HPI: Emily [name] is a 82 y.o. female with medical history significant of diabetes, dementia, coronary artery disease, CHF and hypertension, CKD baseline cr 1.2     Presented with increased lethargy for the past 24 hours at her baseline patient has advanced dementia and wheelchair bound. But lately she hasn't been able to transfer out of bed to wheelchair.  Family has noticed some mild coughing patient herself unable to provide any history. Family is not aware of any fevers Regarding pertinent Chronic problems: Regarding patient's history of Alzheimer's she is on Namenda and Aricept as well as Depakote and risperidone at baseline she is not able to answer any questions and confused IN ER:  Temp (24hrs), Avg:93.1 F (33.9 C), Min:93 F (33.9 C), Max:93.1 F (33.9 C)    Heart rate 43  BP 99/69  WBC 8.3 Hg 10.2 plt 98 Na 147 BUN 22 Cr 1.7  CXR initially  nonacute CT head nonacute   TSH 7.06  Lactic acid 0.45 Procalcitonin <0.10  Following Medications were ordered in ER: Medications  vancomycin (VANCOCIN) 1,250 mg in sodium chloride 0.9 % 250 mL IVPB (not administered)  piperacillin-tazobactam (ZOSYN) IVPB 3.375 g (not administered)  sodium chloride 0.9 % bolus 1,000 mL (not administered)  levothyroxine (SYNTHROID, LEVOTHROID) injection 100 mcg (not administered)  hydrocortisone sodium succinate (SOLU-CORTEF) 100 MG injection 100 mg (not administered)  sodium chloride 0.9 % bolus 1,000 mL (0 mLs Intravenous Stopped 04/09/16 1809)  piperacillin-tazobactam (ZOSYN) IVPB 3.375 g (0 g Intravenous Stopped 04/09/16 1808)  vancomycin (VANCOCIN) IVPB 1000 mg/200 mL premix (0 mg Intravenous Stopped 04/09/16 1958)  vancomycin (VANCOCIN) IVPB 1000 mg/200 mL premix  (1,000 mg Intravenous New Bag/Given 04/09/16 1958)  sodium chloride 0.9 % bolus 1,000 mL (1,000 mLs Intravenous New Bag/Given 04/09/16 1921)  atropine 1 MG/10ML injection 0.5 mg (0.5 mg Intravenous Given 04/09/16 2036)    Hospitalist was called for admission for Dehydration and acute on chronic renal failure bradycardia and hypothermia  Review of Systems: Unable to obtain since patient is nonverbal  Past Medical History: Past Medical History:  Diagnosis Date  . Alzheimer disease   . CAD (coronary artery disease)    Moderate coronary artery disease status post angioplasty to the right coronary artery in 2003 and an ejection fraction 46% on catheterization in 2006.  . Diabetes mellitus   . Duodenal erosion 2009   EGD by Dr. [name]  . Gastric erosion 2009   EGD by Dr. [name]  . Hypertension   . Osteoarthritis   . Perforated ulcer ([place]) 2002   Perforated pyloric channel ulcer patched by Dr. [name]  . Psoriasis   . Systolic CHF ([place])    EF as low is 45%, but 55% in 2009   Past Surgical History:  Procedure Laterality Date  . TOTAL HIP ARTHROPLASTY Right 2007     Social History:  Ambulatory  wheelchair bound,     reports that she has never smoked. She does not have any smokeless tobacco history on file. She reports that she does not drink alcohol or use drugs.  Allergies:  No Known Allergies     Family History:   Family History  Problem Relation Age of Onset  . Cancer Mother     Medications: Prior  to Admission medications   Medication Sig Start Date End Date Taking? Authorizing Provider  acetaminophen (TYLENOL) 325 MG tablet Take 1,300 mg by mouth daily.   Yes Historical Provider, MD  acetaminophen (TYLENOL) 500 MG tablet Take 500 mg by mouth every 4 (four) hours as needed for mild pain, fever or headache.   Yes Historical Provider, MD  allopurinol (ZYLOPRIM) 300 MG tablet Take 150 mg by mouth daily.   Yes Historical Provider, MD  alum & mag hydroxide-simeth (MINTOX)  200-200-20 MG/5ML suspension Take 30 mLs by mouth every 6 (six) hours as needed for indigestion or heartburn.   Yes Historical Provider, MD  amLODipine (NORVASC) 10 MG tablet Take 10 mg by mouth daily.    Yes Historical Provider, MD  ammonium lactate (AMLACTIN) 12 % cream Apply 1 g topically 2 (two) times daily.   Yes Historical Provider, MD  bag balm OINT ointment Apply 1 application topically 2 (two) times daily.   Yes Historical Provider, MD  carvedilol (COREG) 12.5 MG tablet Take 18.75 mg by mouth 2 (two) times daily with a meal.  10/12/10  Yes [name], MD  cetirizine (ZYRTEC) 5 MG tablet Take 5 mg by mouth daily.   Yes Historical Provider, MD  cholecalciferol (VITAMIN D) 1000 UNITS tablet Take 1,000 Units by mouth daily.    Yes Historical Provider, MD  divalproex (DEPAKOTE) 125 MG DR tablet Take 250 mg by mouth 3 (three) times daily.    Yes Historical Provider, MD  donepezil (ARICEPT) 10 MG tablet Take 10 mg by mouth at bedtime.    Yes Historical Provider, MD  furosemide (LASIX) 40 MG tablet Take 40 mg by mouth daily.    Yes Historical Provider, MD  guaifenesin (ROBITUSSIN) 100 MG/5ML syrup Take 200 mg by mouth every 6 (six) hours as needed for cough.   Yes Historical Provider, MD  loperamide (IMODIUM) 2 MG capsule Take 2 mg by mouth daily as needed for diarrhea or loose stools.   Yes Historical Provider, MD  LORazepam (ATIVAN) 0.5 MG tablet Take 0.5 mg by mouth at bedtime.   Yes Historical Provider, MD  losartan (COZAAR) 100 MG tablet Take 100 mg by mouth daily.    Yes Historical Provider, MD  magnesium hydroxide (MILK OF MAGNESIA) 400 MG/5ML suspension Take 30 mLs by mouth daily as needed for mild constipation.   Yes Historical Provider, MD  memantine (NAMENDA XR) 14 MG CP24 24 hr capsule Take 14 mg by mouth daily.   Yes Historical Provider, MD  Menthol, Topical Analgesic, (BIOFREEZE EX) Apply 1 application topically 2 (two) times daily.   Yes Historical Provider, MD  mineral oil  external liquid Place 2 drops into both ears 2 (two) times daily.   Yes Historical Provider, MD  mirtazapine (REMERON) 15 MG tablet Take 15 mg by mouth at bedtime.   Yes Historical Provider, MD  risperiDONE (RISPERDAL) 1 MG/ML oral solution Take 0.5 mg by mouth 2 (two) times daily. Mix in drink   Yes Historical Provider, MD  traZODone (DESYREL) 150 MG tablet Take 150 mg by mouth at bedtime.   Yes Historical Provider, MD  neomycin-bacitracin-polymyxin (NEOSPORIN) [phone] ointment Apply 1 application topically daily as needed (minor skin tears of abrasions.).    Historical Provider, MD    Physical Exam: Patient Vitals for the past 24 hrs:  BP Temp Temp src Pulse Resp SpO2 Weight  04/09/16 1931 99/68 - - (!) 43 16 93 % -  04/09/16 1930 99/68 - - (!) 49  19 94 % -  04/09/16 1900 (!) 92/54 - - (!) 44 21 95 % -  04/09/16 1847 106/63 - - (!) 51 17 93 % -  04/09/16 1845 106/63 - - (!) 47 17 90 % -  04/09/16 1841 122/75 - - (!) 52 21 (!) 87 % -  04/09/16 1800 102/55 (!) 93.1 F (33.9 C) Rectal (!) 42 11 92 % -  04/09/16 1754 139/72 - - (!) 49 11 91 % -  04/09/16 1730 - - - - - - 113.4 kg (250 lb)  04/09/16 1720 92/55 - - (!) 41 11 95 % -  04/09/16 1700 (!) 89/53 - - (!) 42 11 94 % -  04/09/16 1648 - (!) 93 F (33.9 C) Rectal - - - -  04/09/16 1636 128/73 - - (!) 56 15 95 % -  04/09/16 1609 150/83 - - (!) 53 18 95 % -  04/09/16 1608 - - - - - 95 % -    1. General:  in No Acute distress 2. Psychological:somnolent  and not Oriented 3. Head/ENT:     Dry Mucous Membranes                          Head Non traumatic, neck supple                           Poor Dentition 4. SKIN:   decreased Skin turgor,  Skin clean Dry and intact no rash 5. Heart: Regular rate and rhythm no  Murmur, Rub or gallop 6. Lungs:  no wheezes some crackles   7. Abdomen: Soft,  non-tender, Non distended 8. Lower extremities: no clubbing, cyanosis, or edema 9. Neurologically  minimally responcive to painful stimuli,  not following any comands   body mass index is 39.16 kg/m.  Labs on Admission:   Labs on Admission: I have personally reviewed following labs and imaging studies  CBC:  Recent Labs Lab 04/09/16 1713  WBC 8.3  NEUTROABS 7.2  HGB 10.2*  HCT 30.6*  MCV 86.0  PLT 98*   Basic Metabolic Panel:  Recent Labs Lab 04/09/16 1713  NA 147*  K 3.7  CL 109  CO2 29  GLUCOSE 84  BUN 22*  CREATININE 1.70*  CALCIUM 9.1   GFR: Estimated Creatinine Clearance: 32.6 mL/min (by C-G formula based on SCr of 1.7 mg/dL (H)). Liver Function Tests:  Recent Labs Lab 04/09/16 1713  AST 18  ALT 13*  ALKPHOS 93  BILITOT 0.6  PROT 7.5  ALBUMIN 3.1*   No results for input(s): LIPASE, AMYLASE in the last 168 hours. No results for input(s): AMMONIA in the last 168 hours. Coagulation Profile: No results for input(s): INR, PROTIME in the last 168 hours. Cardiac Enzymes: No results for input(s): CKTOTAL, CKMB, CKMBINDEX, TROPONINI in the last 168 hours. BNP (last 3 results) No results for input(s): PROBNP in the last 8760 hours. HbA1C: No results for input(s): HGBA1C in the last 72 hours. CBG:  Recent Labs Lab 04/09/16 1735  GLUCAP 80   Lipid Profile: No results for input(s): CHOL, HDL, LDLCALC, TRIG, CHOLHDL, LDLDIRECT in the last 72 hours. Thyroid Function Tests: No results for input(s): TSH, T4TOTAL, FREET4, T3FREE, THYROIDAB in the last 72 hours. Anemia Panel: No results for input(s): VITAMINB12, FOLATE, FERRITIN, TIBC, IRON, RETICCTPCT in the last 72 hours. Urine analysis:    Component Value Date/Time   COLORURINE AMBER (A)  04/09/2016 1750   APPEARANCEUR CLOUDY (A) 04/09/2016 1750   LABSPEC 1.019 04/09/2016 1750   PHURINE 5.5 04/09/2016 1750   GLUCOSEU NEGATIVE 04/09/2016 1750   HGBUR NEGATIVE 04/09/2016 1750   BILIRUBINUR SMALL (A) 04/09/2016 1750   KETONESUR NEGATIVE 04/09/2016 1750   PROTEINUR NEGATIVE 04/09/2016 1750   UROBILINOGEN 0.2 03/25/2015 0930   NITRITE  NEGATIVE 04/09/2016 1750   LEUKOCYTESUR NEGATIVE 04/09/2016 1750   Sepsis Labs: @LABRCNTIP (procalcitonin:4,lacticidven:4) )No results found for this or any previous visit (from the past 240 hour(s)).     UA  no evidence of UTI    Lab Results  Component Value Date   HGBA1C 6.6 12/09/2009    Estimated Creatinine Clearance: 32.6 mL/min (by C-G formula based on SCr of 1.7 mg/dL (H)).  BNP (last 3 results) No results for input(s): PROBNP in the last 8760 hours.   ECG REPORT  Independently reviewed Rate: 55  Rhythm: Sinus bradycardia ST&T Change: T wave inversions QTC 520  Filed Weights   04/09/16 1730  Weight: 113.4 kg (250 lb)     Cultures:    Component Value Date/Time   SDES URINE, CATHETERIZED 07/29/2015 0100   SPECREQUEST NONE 07/29/2015 0100   CULT 1,000 COLONIES/mL INSIGNIFICANT GROWTH 07/29/2015 0100   REPTSTATUS 07/30/2015 FINAL 07/29/2015 0100     Radiological Exams on Admission: Dg Chest 2 View  Result Date: 04/09/2016 CLINICAL DATA:  Altered mental status and fever. EXAM: CHEST  2 VIEW COMPARISON:  12/14/2015 FINDINGS: Unable to raise arms. Lungs are adequately inflated without definite airspace consolidation or effusion. There is moderate stable cardiomegaly. There is calcified plaque over the thoracic aorta. There are degenerative changes of the shoulders and spine. IMPRESSION: No acute cardiopulmonary disease. Stable cardiomegaly. Aortic atherosclerosis. Electronically Signed   By: Marin Olp M.D.   On: 04/09/2016 18:54   Ct Head Wo Contrast  Result Date: 04/09/2016 CLINICAL DATA:  Altered mental status. EXAM: CT HEAD WITHOUT CONTRAST TECHNIQUE: Contiguous axial images were obtained from the base of the skull through the vertex without intravenous contrast. COMPARISON:  02/19/2016 head CT. FINDINGS: Brain: Stable densely calcified 2.6 cm meningioma in the right anterior frontal parafalcine convexity with stable local mass effect. No evidence of  parenchymal hemorrhage or extra-axial fluid collection. Otherwise no mass lesion, mass effect, or midline shift. No CT evidence of acute infarction. Intracranial atherosclerosis. Generalized cerebral volume loss, most prominent in the bilateral temporal lobes. Nonspecific moderate subcortical and periventricular white matter hypodensity, most in keeping with chronic small vessel ischemic change. Cerebral ventricle sizes are stable and concordant with the degree of cerebral volume loss. Vascular: No hyperdense vessel or unexpected calcification. Skull: No evidence of calvarial fracture. Sinuses/Orbits: The visualized paranasal sinuses are essentially clear. Other:  The mastoid air cells are unopacified. IMPRESSION: 1.  No evidence of acute intracranial abnormality. 2. Stable right anterior frontal convexity meningioma. 3. Generalized cerebral volume loss, most prominent in the bilateral temporal lobes. Moderate chronic small vessel ischemia. Electronically Signed   By: Ilona Sorrel M.D.   On: 04/09/2016 18:51    Chart has been reviewed    Assessment/Plan   82 y.o. female with medical history significant of diabetes, dementia, coronary artery disease, CHF and hypertension, CKD admitted for acute encephalopathy in the setting of dehydration hypothermia and bradycardia worrisome for myxedema coma vs. Sepsis.   Present on Admission:  Possible Myxedema Coma - elevated TSH, and a setting of hypothermia, bradycardia, hypotension diminished mental status, have administered Synthroid 100 g IV  (reduced dose  given hx of CAD) and started on hydrocortisone 100 mg IV q8. Ordered cortisol level. After administration slight improvement in mental status, blood pressure and heart rate. Appreciate PCCM consult. At this point no longer requires pressors but will continue to monitor in ICU. Acute respiratory failure and hypoxia - patient desaturated requiring nonrebreather. Obtain repeat chest x-ray. Patient is DO NOT  INTUBATE. The VBG done no evidence of hypercarbia. Continue supportive management this was discussed with family. Patient on vancomycin and Zosyn should cover healthcare associated pneumonia or aspiration She had today high risk of aspiration given diminished mental status and inability to protect airway this was at length discussed with family who understands the severity of patient's illness. . Septic shock ([place]) - Admit per Sepsis protocol  Source unknown  - rehydrate with 85ml/kg  - initiate broad spectrum antibiotics  Vancomycin and Zosyn  -  obtain blood cultures  - Obtain serial lactic acid  - Obtain procalcitonin level  - Admit and monitor vital signs closely  -  PCCM has been consulted  Sepsis - Repeat Assessment  Performed at:    9:20  Vitals     Blood pressure 90/58, pulse 64, temperature (!) 93.8 F (34.3 C), temperature source Rectal, resp. rate 17, weight 113.4 kg (250 lb), SpO2 (!) 86 %.  Heart:     Regular rate and rhythm  Lungs:    Rales  Capillary Refill:   <2 sec  Peripheral Pulse:   Radial pulse palpable  Skin:     Pale   . Coronary atherosclerosis - will cycle CE  family understands that she will not be candidate for cardiac cathetarization given possible sepsis and ARF . DM (diabetes mellitus), type 2 with renal complications (HCC) - order SSI . Essential hypertension - given hypotension will hold home medications . Acute encephalopathy - in the setting of sepsis, CT head unremarkable pateint is unstable will not tolerate MRI, family states ok to hold off on MRI for now as it will not change management . Hypotension - will fluid  resuscitate, treat reversible causes    Other plan as per orders.  DVT prophylaxis:   scd    Code Status:   Limited code  as per  family , OK to give Antibiotics, pressors, shock, central lines Discussed at length spent at least 35 minutes face-to-face providing critical care in a setting of severe hypoxia, hypotension and  bradycardia Patient requiring atropine. Personally reviewed EKG and radiology studies.  Family Communication:   Family  at  Bedside  plan of care was discussed with  [facility] Daughter and grandson  Disposition Plan:   Overall poor prognosis as per patient's wishes ordered palliative care consult currently patient is Limited code monitor in ICU                          Back to current facility when stable                                              Social Work       Nutrition  Palliative care   consulted                          Consults called: PCCM  Is aware will consult  Admission status:   obs  Level of care     tele         I have spent a total of 85 min on this admission 74min face to face time spent to provide critical care requiring frequent in the evaluations  in in case of unstable patient and to Discuss care with the family Kha Hari 04/09/2016, 9:52 PM    [facility]  Pager [phone]   after 2 AM please page floor coverage PA If 7AM-7PM, please contact the day team taking care of the patient  [email or web address]  Password [email or web address]

## 2016-04-09 NOTE — ED Notes (Signed)
Delay in transporting pt upstairs due to Chest XRay being performed

## 2016-04-09 NOTE — ED Notes (Signed)
Attempted to obtain pt temp, pt uncooperative, RN notified.

## 2016-04-09 NOTE — ED Triage Notes (Signed)
Per EMS pt from [facility] sent per request of grandson verbalizing pt "different" since last seen and seems more lethargic. Pt norm is disoriented x4 with hx of dementia. Abnormal lung sounds noted with triage.

## 2016-04-09 NOTE — ED Notes (Addendum)
Bear hugger applied. Tamera Punt MD aware.

## 2016-04-09 NOTE — ED Provider Notes (Addendum)
[facility] DEPT Provider Note   CSN: [id number] Arrival date & time: 04/09/16  1558     History   Chief Complaint Chief Complaint  Patient presents with  . Fatigue    HPI Cristie B Niue is a 82 y.o. female.  Patient is a 82 year old female with a history of diabetes, dementia, coronary artery disease, CHF and hypertension who presents with altered mental status. She has dementia and is confused at baseline. She is nonambulatory but does sit in a wheelchair. Family notes that over the last 2 days she's had increasing drowsiness and increased confusion. She was less alert than she normally is. Her son is at bedside. They have noticed a mild cough but no other change in symptomatology. No known urinary symptoms. No vomiting. No known fevers. History is limited due to her dementia.      Past Medical History:  Diagnosis Date  . Alzheimer disease   . CAD (coronary artery disease)    Moderate coronary artery disease status post angioplasty to the right coronary artery in 2003 and an ejection fraction 46% on catheterization in 2006.  . Diabetes mellitus   . Duodenal erosion 2009   EGD by Dr. [name]  . Gastric erosion 2009   EGD by Dr. [name]  . Hypertension   . Osteoarthritis   . Perforated ulcer ([place]) 2002   Perforated pyloric channel ulcer patched by Dr. [name]  . Psoriasis   . Systolic CHF ([place])    EF as low is 45%, but 55% in 2009    Patient Active Problem List   Diagnosis Date Noted  . Acute encephalopathy 04/09/2016  . Acute GI bleeding 03/04/2013  . MICROCYTOSIS 07/29/2009  . DIVERTICULOSIS, COLON 09/14/2007  . DUODENITIS, WITHOUT HEMORRHAGE 08/17/2007  . CHEST PAIN 08/15/2007  . ADENOMATOUS COLONIC POLYP 05/31/2007  . Coronary atherosclerosis 05/31/2007  . HYPERLIPIDEMIA 08/09/2006  . DM (diabetes mellitus), type 2 with renal complications ([place]) [id number]  . HYPERCHOLESTEROLEMIA 06/29/2006  . OBESITY 06/29/2006  . Essential hypertension 06/29/2006  .  Peptic ulcer 06/29/2006  . PSORIASIS 06/29/2006  . DEGENERATIVE JOINT DISEASE 06/29/2006  . SYMPTOM, MEMORY LOSS 06/29/2006  . WEIGHT LOSS 06/29/2006  . DEPRESSION, HX OF 06/29/2006  . HIP REPLACEMENT, TOTAL, HX OF 06/29/2006  . HYSTERECTOMY, HX OF 06/29/2006  . PERCUTANEOUS TRANSLUMINAL CORONARY ANGIOPLASTY, HX OF 06/29/2006  . CARPAL TUNNEL RELEASE, HX OF 06/29/2006    Past Surgical History:  Procedure Laterality Date  . TOTAL HIP ARTHROPLASTY Right 2007    OB History    No data available       Home Medications    Prior to Admission medications   Medication Sig Start Date End Date Taking? Authorizing Provider  acetaminophen (TYLENOL) 325 MG tablet Take 1,300 mg by mouth daily.   Yes Historical Provider, MD  acetaminophen (TYLENOL) 500 MG tablet Take 500 mg by mouth every 4 (four) hours as needed for mild pain, fever or headache.   Yes Historical Provider, MD  allopurinol (ZYLOPRIM) 300 MG tablet Take 150 mg by mouth daily.   Yes Historical Provider, MD  alum & mag hydroxide-simeth (MINTOX) 200-200-20 MG/5ML suspension Take 30 mLs by mouth every 6 (six) hours as needed for indigestion or heartburn.   Yes Historical Provider, MD  amLODipine (NORVASC) 10 MG tablet Take 10 mg by mouth daily.    Yes Historical Provider, MD  ammonium lactate (AMLACTIN) 12 % cream Apply 1 g topically 2 (two) times daily.   Yes Historical Provider, MD  bag  balm OINT ointment Apply 1 application topically 2 (two) times daily.   Yes Historical Provider, MD  carvedilol (COREG) 12.5 MG tablet Take 18.75 mg by mouth 2 (two) times daily with a meal.  10/12/10  Yes [name], MD  cetirizine (ZYRTEC) 5 MG tablet Take 5 mg by mouth daily.   Yes Historical Provider, MD  cholecalciferol (VITAMIN D) 1000 UNITS tablet Take 1,000 Units by mouth daily.    Yes Historical Provider, MD  divalproex (DEPAKOTE) 125 MG DR tablet Take 250 mg by mouth 3 (three) times daily.    Yes Historical Provider, MD  donepezil  (ARICEPT) 10 MG tablet Take 10 mg by mouth at bedtime.    Yes Historical Provider, MD  furosemide (LASIX) 40 MG tablet Take 40 mg by mouth daily.    Yes Historical Provider, MD  guaifenesin (ROBITUSSIN) 100 MG/5ML syrup Take 200 mg by mouth every 6 (six) hours as needed for cough.   Yes Historical Provider, MD  loperamide (IMODIUM) 2 MG capsule Take 2 mg by mouth daily as needed for diarrhea or loose stools.   Yes Historical Provider, MD  LORazepam (ATIVAN) 0.5 MG tablet Take 0.5 mg by mouth at bedtime.   Yes Historical Provider, MD  losartan (COZAAR) 100 MG tablet Take 100 mg by mouth daily.    Yes Historical Provider, MD  magnesium hydroxide (MILK OF MAGNESIA) 400 MG/5ML suspension Take 30 mLs by mouth daily as needed for mild constipation.   Yes Historical Provider, MD  memantine (NAMENDA XR) 14 MG CP24 24 hr capsule Take 14 mg by mouth daily.   Yes Historical Provider, MD  Menthol, Topical Analgesic, (BIOFREEZE EX) Apply 1 application topically 2 (two) times daily.   Yes Historical Provider, MD  mineral oil external liquid Place 2 drops into both ears 2 (two) times daily.   Yes Historical Provider, MD  mirtazapine (REMERON) 15 MG tablet Take 15 mg by mouth at bedtime.   Yes Historical Provider, MD  risperiDONE (RISPERDAL) 1 MG/ML oral solution Take 0.5 mg by mouth 2 (two) times daily. Mix in drink   Yes Historical Provider, MD  traZODone (DESYREL) 150 MG tablet Take 150 mg by mouth at bedtime.   Yes Historical Provider, MD  neomycin-bacitracin-polymyxin (NEOSPORIN) [phone] ointment Apply 1 application topically daily as needed (minor skin tears of abrasions.).    Historical Provider, MD    Family History Family History  Problem Relation Age of Onset  . Family history unknown: Yes    Social History Social History  Substance Use Topics  . Smoking status: Never Smoker  . Smokeless tobacco: Not on file  . Alcohol use No     Allergies   Review of patient's allergies indicates no  known allergies.   Review of Systems Review of Systems  Unable to perform ROS: Dementia     Physical Exam Updated Vital Signs BP 99/68   Pulse (!) 43   Temp (!) 93.1 F (33.9 C) (Rectal)   Resp 16   Wt 250 lb (113.4 kg)   SpO2 93%   BMI 39.16 kg/m   Physical Exam  Constitutional: She appears well-developed and well-nourished.  HENT:  Head: Normocephalic and atraumatic.  Eyes: Pupils are equal, round, and reactive to light.  Neck: Normal range of motion. Neck supple.  Cardiovascular: Regular rhythm and normal heart sounds.  Bradycardia present.   Heart rate is in the 40s  Pulmonary/Chest: Effort normal and breath sounds normal. No respiratory distress. She has no wheezes.  She has no rales. She exhibits no tenderness.  Abdominal: Soft. Bowel sounds are normal. There is no tenderness. There is no rebound and no guarding.  Musculoskeletal: Normal range of motion. She exhibits edema (1+ pain edema bilaterally).  Lymphadenopathy:    She has no cervical adenopathy.  Neurological:  Patient is asleep when I go in there but will wake up and become agitated. I don't get any comprehensible speech. She does seem to be moving all extremities symmetrically. She withdraws from stimuli.  Skin: Skin is warm and dry. No rash noted.  Psychiatric: She has a normal mood and affect.     ED Treatments / Results  Labs (all labs ordered are listed, but only abnormal results are displayed) Labs Reviewed  CBC WITH DIFFERENTIAL/PLATELET - Abnormal; Notable for the following:       Result Value   RBC 3.56 (*)    Hemoglobin 10.2 (*)    HCT 30.6 (*)    Platelets 98 (*)    All other components within normal limits  COMPREHENSIVE METABOLIC PANEL - Abnormal; Notable for the following:    Sodium 147 (*)    BUN 22 (*)    Creatinine, Ser 1.70 (*)    Albumin 3.1 (*)    ALT 13 (*)    GFR calc non Af Amer 27 (*)    GFR calc Af Amer 31 (*)    All other components within normal limits  URINALYSIS,  ROUTINE W REFLEX MICROSCOPIC (NOT AT [facility]) - Abnormal; Notable for the following:    Color, Urine AMBER (*)    APPearance CLOUDY (*)    Bilirubin Urine SMALL (*)    All other components within normal limits  URINE CULTURE  CULTURE, BLOOD (ROUTINE X 2)  CULTURE, BLOOD (ROUTINE X 2)  TSH  PROCALCITONIN  BLOOD GAS, VENOUS  CBG MONITORING, ED  I-STAT CG4 LACTIC ACID, ED  I-STAT CG4 LACTIC ACID, ED    EKG  EKG Interpretation  Date/Time:  Saturday April 09 2016 16:47:16 EDT Ventricular Rate:  55 PR Interval:    QRS Duration: 129 QT Interval:  543 QTC Calculation: 520 R Axis:   2 Text Interpretation:  Sinus rhythm Short PR interval Probable left atrial enlargement IVCD, consider atypical LBBB HR has slowed Confirmed by Rosaleah Person  MD, Norelle Runnion ([id number]) on 04/09/2016 7:11:46 PM       Radiology Dg Chest 2 View  Result Date: 04/09/2016 CLINICAL DATA:  Altered mental status and fever. EXAM: CHEST  2 VIEW COMPARISON:  12/14/2015 FINDINGS: Unable to raise arms. Lungs are adequately inflated without definite airspace consolidation or effusion. There is moderate stable cardiomegaly. There is calcified plaque over the thoracic aorta. There are degenerative changes of the shoulders and spine. IMPRESSION: No acute cardiopulmonary disease. Stable cardiomegaly. Aortic atherosclerosis. Electronically Signed   By: Marin Olp M.D.   On: 04/09/2016 18:54   Ct Head Wo Contrast  Result Date: 04/09/2016 CLINICAL DATA:  Altered mental status. EXAM: CT HEAD WITHOUT CONTRAST TECHNIQUE: Contiguous axial images were obtained from the base of the skull through the vertex without intravenous contrast. COMPARISON:  02/19/2016 head CT. FINDINGS: Brain: Stable densely calcified 2.6 cm meningioma in the right anterior frontal parafalcine convexity with stable local mass effect. No evidence of parenchymal hemorrhage or extra-axial fluid collection. Otherwise no mass lesion, mass effect, or midline shift. No CT  evidence of acute infarction. Intracranial atherosclerosis. Generalized cerebral volume loss, most prominent in the bilateral temporal lobes. Nonspecific moderate subcortical and periventricular white  matter hypodensity, most in keeping with chronic small vessel ischemic change. Cerebral ventricle sizes are stable and concordant with the degree of cerebral volume loss. Vascular: No hyperdense vessel or unexpected calcification. Skull: No evidence of calvarial fracture. Sinuses/Orbits: The visualized paranasal sinuses are essentially clear. Other:  The mastoid air cells are unopacified. IMPRESSION: 1.  No evidence of acute intracranial abnormality. 2. Stable right anterior frontal convexity meningioma. 3. Generalized cerebral volume loss, most prominent in the bilateral temporal lobes. Moderate chronic small vessel ischemia. Electronically Signed   By: Ilona Sorrel M.D.   On: 04/09/2016 18:51    Procedures Procedures (including critical care time)  Medications Ordered in ED Medications  vancomycin (VANCOCIN) IVPB 1000 mg/200 mL premix (not administered)  vancomycin (VANCOCIN) 1,250 mg in sodium chloride 0.9 % 250 mL IVPB (not administered)  piperacillin-tazobactam (ZOSYN) IVPB 3.375 g (not administered)  sodium chloride 0.9 % bolus 1,000 mL (1,000 mLs Intravenous New Bag/Given 04/09/16 1921)  sodium chloride 0.9 % bolus 1,000 mL (not administered)  sodium chloride 0.9 % bolus 1,000 mL (0 mLs Intravenous Stopped 04/09/16 1809)  piperacillin-tazobactam (ZOSYN) IVPB 3.375 g (0 g Intravenous Stopped 04/09/16 1808)  vancomycin (VANCOCIN) IVPB 1000 mg/200 mL premix (1,000 mg Intravenous New Bag/Given 04/09/16 1810)     Initial Impression / Assessment and Plan / ED Course  I have reviewed the triage vital signs and the nursing notes.  Pertinent labs & imaging results that were available during my care of the patient were reviewed by me and considered in my medical decision making (see chart for  details).  Clinical Course    Patient presents with altered mental status. She is less alert than her baseline mental status per family. She is noted to be hypothermic and bradycardic. After she was here for a short period time, she became hypotensive with 2 pressures in the low 0000000 systolic. This did respond to IV fluids and her pressure has been in the low 100s since that time. She remains bradycardic with a heart rate in the 40s and 50s. Per chart review, it looks like her heart rate is typically in the 60s. She is on a beta blocker. I initially was concerned about sepsis given her hypothermia and hypotension. However her lactate is normal and I don't find any evidence of infections. Her urine does not appear to be infected. Her chest x-ray doesn't show evidence of pneumonia. She is still hypothermic even on the bear hugger. She was initially given broad-spectrum antibiotics and two 1 L saline boluses for possible sepsis although not taking any evidence of infection at this point. I will consult the hospitalist for admission.  I spoke with Dr. Roel Cluck who will admit the pt.  She did have another episode of hypotension and was given the full 30cc/kg fluid bolus  20:33 PT's BP in 80s again.  Will give atropine since HR still in 40s.  Admitted to stepdown.  Pt is a DNR per family, but they do want medications.  CRITICAL CARE Performed by: Fabio Wah Total critical care time: 60 minutes Critical care time was exclusive of separately billable procedures and treating other patients. Critical care was necessary to treat or prevent imminent or life-threatening deterioration. Critical care was time spent personally by me on the following activities: development of treatment plan with patient and/or surrogate as well as nursing, discussions with consultants, evaluation of patient's response to treatment, examination of patient, obtaining history from patient or surrogate, ordering and performing  treatments and interventions, ordering  and review of laboratory studies, ordering and review of radiographic studies, pulse oximetry and re-evaluation of patient's condition. .   Final Clinical Impressions(s) / ED Diagnoses   Final diagnoses:  Hypothermia, initial encounter  Hypotension, unspecified hypotension type  Bradycardia    New Prescriptions New Prescriptions   No medications on file     Malvin Johns, MD 04/09/16 [place], MD 04/09/16 2034

## 2016-04-09 NOTE — Progress Notes (Signed)
Pharmacy Antibiotic Note  Emily [name] is a 82 y.o. female admitted on 04/09/2016 with sepsis.  Pharmacy has been consulted for vancomycin/Zosyn dosing.  Patient is a 82 year old female with a history of diabetes, dementia, coronary artery disease, CHF and hypertension who presents with altered mental status. She has dementia and is confused at baseline. She is nonambulatory but does sit in a wheelchair. Family notes that over the last 2 days she's had increasing drowsiness and increased confusion.  Plan: Zosyn 3.375g IV q8h (4 hour infusion).  Vancomycin 2000 mg IV x1, then vancomycin 1250 mg IV q24h  Weight: 250 lb (113.4 kg)  Temp (24hrs), Avg:93.1 F (33.9 C), Min:93 F (33.9 C), Max:93.1 F (33.9 C)   Recent Labs Lab 04/09/16 1713 04/09/16 1723  WBC 8.3  --   CREATININE 1.70*  --   LATICACIDVEN  --  1.39    Estimated Creatinine Clearance: 32.6 mL/min (by C-G formula based on SCr of 1.7 mg/dL (H)).    No Known Allergies  Antimicrobials this admission: Vancomycin 10/21 >>  Zosyn 10/21 >>   Dose adjustments this admission: ---  Microbiology results: 10/21 BCx:  10/21 UCx:     Thank you for allowing pharmacy to be a part of this patient's care.  Royetta Asal, PharmD, BCPS Pager [phone] 04/09/2016 7:05 PM

## 2016-04-10 DIAGNOSIS — J189 Pneumonia, unspecified organism: Secondary | ICD-10-CM

## 2016-04-10 DIAGNOSIS — T68XXXA Hypothermia, initial encounter: Secondary | ICD-10-CM

## 2016-04-10 DIAGNOSIS — J9601 Acute respiratory failure with hypoxia: Secondary | ICD-10-CM

## 2016-04-10 DIAGNOSIS — E1122 Type 2 diabetes mellitus with diabetic chronic kidney disease: Secondary | ICD-10-CM

## 2016-04-10 DIAGNOSIS — R001 Bradycardia, unspecified: Secondary | ICD-10-CM

## 2016-04-10 DIAGNOSIS — N183 Chronic kidney disease, stage 3 (moderate): Secondary | ICD-10-CM

## 2016-04-10 LAB — CBC
HCT: 28.7 % — ABNORMAL LOW (ref 36.0–46.0)
Hemoglobin: 9.5 g/dL — ABNORMAL LOW (ref 12.0–15.0)
MCH: 28 pg (ref 26.0–34.0)
MCHC: 33.1 g/dL (ref 30.0–36.0)
MCV: 84.7 fL (ref 78.0–100.0)
PLATELETS: 101 10*3/uL — AB (ref 150–400)
RBC: 3.39 MIL/uL — ABNORMAL LOW (ref 3.87–5.11)
RDW: 15.7 % — AB (ref 11.5–15.5)
WBC: 7.5 10*3/uL (ref 4.0–10.5)

## 2016-04-10 LAB — GLUCOSE, CAPILLARY
Glucose-Capillary: 116 mg/dL — ABNORMAL HIGH (ref 65–99)
Glucose-Capillary: 150 mg/dL — ABNORMAL HIGH (ref 65–99)
Glucose-Capillary: 72 mg/dL (ref 65–99)
Glucose-Capillary: 83 mg/dL (ref 65–99)
Glucose-Capillary: 86 mg/dL (ref 65–99)

## 2016-04-10 LAB — COMPREHENSIVE METABOLIC PANEL
ALT: 11 U/L — AB (ref 14–54)
AST: 14 U/L — AB (ref 15–41)
Albumin: 2.9 g/dL — ABNORMAL LOW (ref 3.5–5.0)
Alkaline Phosphatase: 84 U/L (ref 38–126)
Anion gap: 5 (ref 5–15)
BILIRUBIN TOTAL: 0.8 mg/dL (ref 0.3–1.2)
BUN: 22 mg/dL — AB (ref 6–20)
CALCIUM: 8.4 mg/dL — AB (ref 8.9–10.3)
CO2: 30 mmol/L (ref 22–32)
CREATININE: 1.8 mg/dL — AB (ref 0.44–1.00)
Chloride: 113 mmol/L — ABNORMAL HIGH (ref 101–111)
GFR calc Af Amer: 29 mL/min — ABNORMAL LOW (ref >60)
GFR, EST NON AFRICAN AMERICAN: 25 mL/min — AB (ref >60)
Glucose, Bld: 69 mg/dL (ref 65–99)
Potassium: 3.8 mmol/L (ref 3.5–5.1)
Sodium: 148 mmol/L — ABNORMAL HIGH (ref 135–145)
TOTAL PROTEIN: 6.9 g/dL (ref 6.5–8.1)

## 2016-04-10 LAB — URINE CULTURE: CULTURE: NO GROWTH

## 2016-04-10 LAB — TROPONIN I

## 2016-04-10 LAB — CORTISOL: Cortisol, Plasma: >100 ug/dL

## 2016-04-10 LAB — PHOSPHORUS: Phosphorus: 5 mg/dL — ABNORMAL HIGH (ref 2.5–4.6)

## 2016-04-10 LAB — LACTIC ACID, PLASMA: Lactic Acid, Venous: 0.8 mmol/L (ref 0.5–1.9)

## 2016-04-10 LAB — MAGNESIUM: Magnesium: 2 mg/dL (ref 1.7–2.4)

## 2016-04-10 LAB — VALPROIC ACID LEVEL: Valproic Acid Lvl: 19 ug/mL — ABNORMAL LOW (ref 50.0–100.0)

## 2016-04-10 LAB — T4, FREE: Free T4: 0.94 ng/dL (ref 0.61–1.12)

## 2016-04-10 LAB — MRSA PCR SCREENING: MRSA by PCR: NEGATIVE

## 2016-04-10 LAB — TSH: TSH: 6.074 u[IU]/mL — ABNORMAL HIGH (ref 0.350–4.500)

## 2016-04-10 MED ORDER — VANCOMYCIN HCL IN DEXTROSE 1-5 GM/200ML-% IV SOLN
1000.0000 mg | INTRAVENOUS | Status: DC
  Administered 2016-04-10: 1000 mg via INTRAVENOUS
  Filled 2016-04-10: qty 200

## 2016-04-10 MED ORDER — LORAZEPAM 2 MG/ML IJ SOLN: 0.5000 mg | Freq: Once | INTRAMUSCULAR | Status: DC

## 2016-04-10 MED ORDER — LORAZEPAM 2 MG/ML IJ SOLN
0.5000 mg | Freq: Once | INTRAMUSCULAR | Status: AC
  Administered 2016-04-10: 0.5 mg via INTRAVENOUS
  Filled 2016-04-10: qty 1

## 2016-04-10 NOTE — Consult Note (Signed)
Consultation Note Date: 04/10/2016   Patient Name: Emily [name]  DOB: [birth year]  MRN: [id number]  Age / Sex: 82 y.o., female  PCP: Janifer Adie, MD Referring Physician: Bonnielee Haff, MD  Reason for Consultation: Establishing goals of care  HPI/Patient Profile: 82 y.o. female   admitted on 04/09/2016   .   Clinical Assessment and Goals of Care:  82 year old African-American lady past medical history significant for diabetes advanced dementia coronary artery disease congestive heart failure known ejection fraction hypertension chronic kidney disease. A shunt has been living at skilled nursing facility [facility] for about a year. Prior to that, her daughter [name] who is also her healthcare power of attorney agent was her primary caregiver and took care of her at home for 4 years. Patient has arrived to the hospital with hypothermia and altered mental status. She has been seen and evaluated by critical care medicine. She has acute hypoxic respiratory failure, questionable sepsis unknown source, possible myxedema. Her baseline is such that she was verbal at baseline, was able to use wheelchair. However she has had ongoing decline from a dementia standpoint according to family members present at the bedside. Patient has been placed on warming blankets and IV antibiotics. Palliative care consultation for additional goals of care discussions  CODE STATUS of DO NOT RESUSCITATE has started been established. Certainly this is most appropriate. Discussed in detail with daughter [name] present at the bedside. Patient continues to be unresponsive throughout my visit. Bedside nurse reports that the patient simply moaned and grunted while her Foley catheter was being placed. Other than that, did not respond. Introduced myself and palliative care with the daughter as follows: Palliative medicine is specialized medical  care for people living with serious illness. It focuses on providing relief from the symptoms and stress of a serious illness. The goal is to improve quality of life for both the patient and the family.  Her states she is well aware of the patient's acute critical current condition. She wishes to continue current mode of care in a hope to get the patient back to where she was prior to this acute event. If patient has ongoing lack of improvement, she is interested in discussing further with hospice liaisons and would consider a more comfort based approach to care at that time. All questions answered to the best of my ability. Offered active listening and supportive care. Thank you for the consult  HCPOA  daughter [name].   SUMMARY OF RECOMMENDATIONS    DNR DNI Time limited trial of current therapies for the next 24 hours, if no significant improvement in the next 24 hours, then hospice consult, consider d.c to hospice facility by 10-23 as discussed with HCPOA daughter [name].   Code Status/Advance Care Planning:  DNR    Symptom Management:     Continue current mode of treatment.  Palliative Prophylaxis:   Bowel Regimen  Psycho-social/Spiritual:   Desire for further Chaplaincy support:no  Additional Recommendations: Education on Hospice  Prognosis:   < 2 weeks  Discharge Planning: Hospice facility versus anticipated hospital death.       Primary Diagnoses: Present on Admission: . Coronary atherosclerosis . DM (diabetes mellitus), type 2 with renal complications ([place]) . Essential hypertension . Peptic ulcer . Acute encephalopathy . Hypotension . Septic shock ([place]) . Myxedema coma ([place])   I have reviewed the medical record, interviewed the patient and family, and examined the patient. The following aspects are pertinent.  Past Medical History:  Diagnosis Date  . Alzheimer disease   . CAD (coronary artery disease)    Moderate coronary artery disease status post  angioplasty to the right coronary artery in 2003 and an ejection fraction 46% on catheterization in 2006.  . Diabetes mellitus   . Duodenal erosion 2009   EGD by Dr. [name]  . Gastric erosion 2009   EGD by Dr. [name]  . Hypertension   . Osteoarthritis   . Perforated ulcer ([place]) 2002   Perforated pyloric channel ulcer patched by Dr. [name]  . Psoriasis   . Systolic CHF ([place])    EF as low is 45%, but 55% in 2009   Social History   Social History  . Marital status: Married    Spouse name: N/A  . Number of children: N/A  . Years of education: N/A   Social History Main Topics  . Smoking status: Never Smoker  . Smokeless tobacco: Never Used  . Alcohol use No  . Drug use: No  . Sexual activity: Not Asked   Other Topics Concern  . None   Social History Narrative  . None   Family History  Problem Relation Age of Onset  . Cancer Mother    Scheduled Meds: . hydrocortisone sod succinate (SOLU-CORTEF) inj  100 mg Intravenous Q8H  . insulin aspart  0-9 Units Subcutaneous Q4H  . levothyroxine  100 mcg Intravenous Daily  . LORazepam  0.5 mg Intravenous Once  . piperacillin-tazobactam (ZOSYN)  IV  3.375 g Intravenous Q8H  . sodium chloride flush  3 mL Intravenous Q12H  . vancomycin  1,250 mg Intravenous Q24H   Continuous Infusions:  PRN Meds:.acetaminophen **OR** acetaminophen, ondansetron **OR** ondansetron (ZOFRAN) IV Medications Prior to Admission:  Prior to Admission medications   Medication Sig Start Date End Date Taking? Authorizing Provider  acetaminophen (TYLENOL) 325 MG tablet Take 1,300 mg by mouth daily.   Yes Historical Provider, MD  acetaminophen (TYLENOL) 500 MG tablet Take 500 mg by mouth every 4 (four) hours as needed for mild pain, fever or headache.   Yes Historical Provider, MD  allopurinol (ZYLOPRIM) 300 MG tablet Take 150 mg by mouth daily.   Yes Historical Provider, MD  alum & mag hydroxide-simeth (MINTOX) 200-200-20 MG/5ML suspension Take 30 mLs by mouth  every 6 (six) hours as needed for indigestion or heartburn.   Yes Historical Provider, MD  amLODipine (NORVASC) 10 MG tablet Take 10 mg by mouth daily.    Yes Historical Provider, MD  ammonium lactate (AMLACTIN) 12 % cream Apply 1 g topically 2 (two) times daily.   Yes Historical Provider, MD  bag balm OINT ointment Apply 1 application topically 2 (two) times daily.   Yes Historical Provider, MD  carvedilol (COREG) 12.5 MG tablet Take 18.75 mg by mouth 2 (two) times daily with a meal.  10/12/10  Yes [name], MD  cetirizine (ZYRTEC) 5 MG tablet Take 5 mg by mouth daily.   Yes Historical Provider, MD  cholecalciferol (VITAMIN D) 1000 UNITS tablet Take 1,000 Units by mouth  daily.    Yes Historical Provider, MD  divalproex (DEPAKOTE) 125 MG DR tablet Take 250 mg by mouth 3 (three) times daily.    Yes Historical Provider, MD  donepezil (ARICEPT) 10 MG tablet Take 10 mg by mouth at bedtime.    Yes Historical Provider, MD  furosemide (LASIX) 40 MG tablet Take 40 mg by mouth daily.    Yes Historical Provider, MD  guaifenesin (ROBITUSSIN) 100 MG/5ML syrup Take 200 mg by mouth every 6 (six) hours as needed for cough.   Yes Historical Provider, MD  loperamide (IMODIUM) 2 MG capsule Take 2 mg by mouth daily as needed for diarrhea or loose stools.   Yes Historical Provider, MD  LORazepam (ATIVAN) 0.5 MG tablet Take 0.5 mg by mouth at bedtime.   Yes Historical Provider, MD  losartan (COZAAR) 100 MG tablet Take 100 mg by mouth daily.    Yes Historical Provider, MD  magnesium hydroxide (MILK OF MAGNESIA) 400 MG/5ML suspension Take 30 mLs by mouth daily as needed for mild constipation.   Yes Historical Provider, MD  memantine (NAMENDA XR) 14 MG CP24 24 hr capsule Take 14 mg by mouth daily.   Yes Historical Provider, MD  Menthol, Topical Analgesic, (BIOFREEZE EX) Apply 1 application topically 2 (two) times daily.   Yes Historical Provider, MD  mineral oil external liquid Place 2 drops into both ears 2 (two)  times daily.   Yes Historical Provider, MD  mirtazapine (REMERON) 15 MG tablet Take 15 mg by mouth at bedtime.   Yes Historical Provider, MD  risperiDONE (RISPERDAL) 1 MG/ML oral solution Take 0.5 mg by mouth 2 (two) times daily. Mix in drink   Yes Historical Provider, MD  traZODone (DESYREL) 150 MG tablet Take 150 mg by mouth at bedtime.   Yes Historical Provider, MD  neomycin-bacitracin-polymyxin (NEOSPORIN) [phone] ointment Apply 1 application topically daily as needed (minor skin tears of abrasions.).    Historical Provider, MD   No Known Allergies Review of Systems Unresponsive  Physical Exam Unresponsive Shallow breathing S1 S2 Does not arouse to voice command Abdomen soft Edema  Vital Signs: BP (!) 154/69 (BP Location: Left Arm)   Pulse 61   Temp 98.9 F (37.2 C) (Oral)   Resp 14   Ht 5\' 8"  (1.727 m)   Wt 93.7 kg (206 lb 9.1 oz)   SpO2 96%   BMI 31.41 kg/m          SpO2: SpO2: 96 % O2 Device:SpO2: 96 % O2 Flow Rate: .O2 Flow Rate (L/min): 5 L/min  IO: Intake/output summary:  Intake/Output Summary (Last 24 hours) at 04/10/16 1149 Last data filed at 04/10/16 0844  Gross per 24 hour  Intake             1153 ml  Output               50 ml  Net             1103 ml    LBM:   Baseline Weight: Weight: 113.4 kg (250 lb) Most recent weight: Weight: 93.7 kg (206 lb 9.1 oz)     Palliative Assessment/Data:   Flowsheet Rows   Flowsheet Row Most Recent Value  Intake Tab  Referral Department  Hospitalist  Unit at Time of Referral  ICU  Palliative Care Primary Diagnosis  Other (Comment)  Palliative Care Type  New Palliative care  Reason for referral  Clarify Goals of Care  Date first seen by Palliative Care  04/10/16  Clinical Assessment  Palliative Performance Scale Score  10%  Pain Max last 24 hours  4  Pain Min Last 24 hours  3  Dyspnea Max Last 24 Hours  4  Dyspnea Min Last 24 hours  3  Nausea Max Last 24 Hours  3  Nausea Min Last 24 Hours  2  Anxiety  Max Last 24 Hours  3  Psychosocial & Spiritual Assessment  Palliative Care Outcomes  Patient/Family meeting held?  Yes  Who was at the meeting?  HCPOA daughter [name]   Palliative Care Outcomes  Clarified goals of care      Time In:  8 Time Out:  9.10 Time Total:  70 Greater than 50%  of this time was spent counseling and coordinating care related to the above assessment and plan.  Signed by: Loistine Chance, MD  [phone]  Please contact Palliative Medicine Team phone at [phone] for questions and concerns.  For individual provider: See Shea Evans

## 2016-04-10 NOTE — Consult Note (Signed)
PULMONARY / CRITICAL CARE MEDICINE   Name: Emily [name] MRN: [id number] DOB: [birth year]    ADMISSION DATE:  04/09/2016 CONSULTATION DATE:  04/10/16   REFERRING MD:  Dr. Roel Cluck / TH  CHIEF COMPLAINT:  lethargy  HISTORY OF PRESENT ILLNESS:   Patient is encephalopathic so no history was obtained from her. Chart review was done as well as discussing the case with patient's grandchildren.  Patient has advanced dementia,  significantly declinen  in the last couple of months. She was almost bedbound  the last couple of weeks. A couple months ago,  She is able to move around with a wheelchair. Her functional status has significantly declined. She stays at the nursing home because of her advanced dementia. There is note of worsening lethargy the last 24 hours  prompting her to be sent to ER. Recent cough.  She is a DO NOT INTUBATE. At the emergency room, she was in respiratory distress. Heart rate  In the 40s, hypothermic with temperature 108F. She was placed on a nonrebreather mask. They sent cultures. She was started on broad-spectrum antibiotics. She was transferred to stepdown unit.  When I saw her,  she was in respiratory distress. Noncommunicative,  Does not follow commands. O2 sats 94% on NRBL with 15L O2.     PAST MEDICAL HISTORY :  She  has a past medical history of Alzheimer disease; CAD (coronary artery disease); Diabetes mellitus; Duodenal erosion (2009); Gastric erosion (2009); Hypertension; Osteoarthritis; Perforated ulcer ([place]) (2002); Psoriasis; and Systolic CHF ([place]).  PAST SURGICAL HISTORY: She  has a past surgical history that includes Total hip arthroplasty (Right, 2007).  No Known Allergies  No current facility-administered medications on file prior to encounter.    Current Outpatient Prescriptions on File Prior to Encounter  Medication Sig  . acetaminophen (TYLENOL) 325 MG tablet Take 1,300 mg by mouth daily.  Marland Kitchen acetaminophen (TYLENOL) 500 MG tablet Take 500 mg by  mouth every 4 (four) hours as needed for mild pain, fever or headache.  . allopurinol (ZYLOPRIM) 300 MG tablet Take 150 mg by mouth daily.  Marland Kitchen alum & mag hydroxide-simeth (MINTOX) [id number] MG/5ML suspension Take 30 mLs by mouth every 6 (six) hours as needed for indigestion or heartburn.  Marland Kitchen amLODipine (NORVASC) 10 MG tablet Take 10 mg by mouth daily.   Marland Kitchen ammonium lactate (AMLACTIN) 12 % cream Apply 1 g topically 2 (two) times daily.  . bag balm OINT ointment Apply 1 application topically 2 (two) times daily.  . carvedilol (COREG) 12.5 MG tablet Take 18.75 mg by mouth 2 (two) times daily with a meal.   . cetirizine (ZYRTEC) 5 MG tablet Take 5 mg by mouth daily.  . cholecalciferol (VITAMIN D) 1000 UNITS tablet Take 1,000 Units by mouth daily.   . divalproex (DEPAKOTE) 125 MG DR tablet Take 250 mg by mouth 3 (three) times daily.   Marland Kitchen donepezil (ARICEPT) 10 MG tablet Take 10 mg by mouth at bedtime.   . furosemide (LASIX) 40 MG tablet Take 40 mg by mouth daily.   Marland Kitchen guaifenesin (ROBITUSSIN) 100 MG/5ML syrup Take 200 mg by mouth every 6 (six) hours as needed for cough.  . loperamide (IMODIUM) 2 MG capsule Take 2 mg by mouth daily as needed for diarrhea or loose stools.  Marland Kitchen LORazepam (ATIVAN) 0.5 MG tablet Take 0.5 mg by mouth at bedtime.  Marland Kitchen losartan (COZAAR) 100 MG tablet Take 100 mg by mouth daily.   . magnesium hydroxide (MILK OF MAGNESIA) 400 MG/5ML suspension  Take 30 mLs by mouth daily as needed for mild constipation.  . memantine (NAMENDA XR) 14 MG CP24 24 hr capsule Take 14 mg by mouth daily.  . Menthol, Topical Analgesic, (BIOFREEZE EX) Apply 1 application topically 2 (two) times daily.  . mineral oil external liquid Place 2 drops into both ears 2 (two) times daily.  . mirtazapine (REMERON) 15 MG tablet Take 15 mg by mouth at bedtime.  . risperiDONE (RISPERDAL) 1 MG/ML oral solution Take 0.5 mg by mouth 2 (two) times daily. Mix in drink  . traZODone (DESYREL) 150 MG tablet Take 150 mg by mouth at  bedtime.  Marland Kitchen neomycin-bacitracin-polymyxin (NEOSPORIN) [phone] ointment Apply 1 application topically daily as needed (minor skin tears of abrasions.).    FAMILY HISTORY:  Her indicated that her mother is deceased. She indicated that her father is deceased.    SOCIAL HISTORY: She  reports that she has never smoked. She has never used smokeless tobacco. She reports that she does not drink alcohol or use drugs.  REVIEW OF SYSTEMS:   As above  SUBJECTIVE:  As above  VITAL SIGNS: BP 120/60   Pulse 61   Temp (!) 94.5 F (34.7 C) (Oral)   Resp (!) 21   Ht 5\' 8"  (1.727 m)   Wt 93.7 kg (206 lb 9.1 oz)   SpO2 100%   BMI 31.41 kg/m   HEMODYNAMICS:    VENTILATOR SETTINGS: FiO2 (%):  [100 %] 100 %  INTAKE / OUTPUT: I/O last 3 completed shifts: In: 1050 [IV Piggyback:1050] Out: 50 [Urine:50]  PHYSICAL EXAMINATION: General:  In resp distress. Chronically ill.  Neuro:  Non verbal, does not follow commands. Pupils reactive B. (-) facial asymmetry.  HEENT:  PERLA. (-) NVD.  Cardiovascular:  Good s1/s2. (-) s3/m/r/g Lungs:  Fair air entry. Crackles bibasilar. (-) wheezing/rhonchi.  Abdomen:  Dec BS, soft, NT Musculoskeletal:  Gr 2 edema, warm to touch (had Bair hugger) Skin:  Warm and dry. (-) rash.   LABS:  BMET  Recent Labs Lab 04/09/16 1713  NA 147*  K 3.7  CL 109  CO2 29  BUN 22*  CREATININE 1.70*  GLUCOSE 84    Electrolytes  Recent Labs Lab 04/09/16 1713  CALCIUM 9.1    CBC  Recent Labs Lab 04/09/16 1713  WBC 8.3  HGB 10.2*  HCT 30.6*  PLT 98*    Coag's  Recent Labs Lab 04/09/16 2247  APTT 61*  INR 1.30    Sepsis Markers  Recent Labs Lab 04/09/16 1723 04/09/16 1953 04/09/16 2005 04/09/16 2247  LATICACIDVEN 1.39  --  0.45* 1.1  PROCALCITON  --  <0.10  --   --     ABG No results for input(s): PHART, PCO2ART, PO2ART in the last 168 hours.  Liver Enzymes  Recent Labs Lab 04/09/16 1713  AST 18  ALT 13*  ALKPHOS 93   BILITOT 0.6  ALBUMIN 3.1*    Cardiac Enzymes  Recent Labs Lab 04/09/16 2247  TROPONINI <0.03    Glucose  Recent Labs Lab 04/09/16 1735 04/09/16 2243 04/10/16 0028  GLUCAP 80 98 116*    Imaging Dg Chest 2 View  Result Date: 04/09/2016 CLINICAL DATA:  Altered mental status and fever. EXAM: CHEST  2 VIEW COMPARISON:  12/14/2015 FINDINGS: Unable to raise arms. Lungs are adequately inflated without definite airspace consolidation or effusion. There is moderate stable cardiomegaly. There is calcified plaque over the thoracic aorta. There are degenerative changes of the shoulders and spine. IMPRESSION:  No acute cardiopulmonary disease. Stable cardiomegaly. Aortic atherosclerosis. Electronically Signed   By: Marin Olp M.D.   On: 04/09/2016 18:54   Ct Head Wo Contrast  Result Date: 04/09/2016 CLINICAL DATA:  Altered mental status. EXAM: CT HEAD WITHOUT CONTRAST TECHNIQUE: Contiguous axial images were obtained from the base of the skull through the vertex without intravenous contrast. COMPARISON:  02/19/2016 head CT. FINDINGS: Brain: Stable densely calcified 2.6 cm meningioma in the right anterior frontal parafalcine convexity with stable local mass effect. No evidence of parenchymal hemorrhage or extra-axial fluid collection. Otherwise no mass lesion, mass effect, or midline shift. No CT evidence of acute infarction. Intracranial atherosclerosis. Generalized cerebral volume loss, most prominent in the bilateral temporal lobes. Nonspecific moderate subcortical and periventricular white matter hypodensity, most in keeping with chronic small vessel ischemic change. Cerebral ventricle sizes are stable and concordant with the degree of cerebral volume loss. Vascular: No hyperdense vessel or unexpected calcification. Skull: No evidence of calvarial fracture. Sinuses/Orbits: The visualized paranasal sinuses are essentially clear. Other:  The mastoid air cells are unopacified. IMPRESSION: 1.  No  evidence of acute intracranial abnormality. 2. Stable right anterior frontal convexity meningioma. 3. Generalized cerebral volume loss, most prominent in the bilateral temporal lobes. Moderate chronic small vessel ischemia. Electronically Signed   By: Ilona Sorrel M.D.   On: 04/09/2016 18:51   Dg Chest Port 1 View  Result Date: 04/09/2016 CLINICAL DATA:  Per EMS pt from [facility] sent per request of grandson verbalizing patient is "different" since last seen and seems more lethargic. Pt norm is disoriented x4 with hx of dementia. Abnormal lung sounds noted with triage. Hx HTN, Diabetic, CHF. Non-smoker. EXAM: PORTABLE CHEST 1 VIEW COMPARISON:  04/09/2016 FINDINGS: Heart is enlarged. There is central perihilar opacity consistent with pulmonary edema. Small right effusion is noted. Degenerative changes are noted in the shoulders and thoracic spine. Study Quality is degraded by patient positioning. IMPRESSION: 1. Cardiomegaly and mild pulmonary edema. Electronically Signed   By: Nolon Nations M.D.   On: 04/09/2016 22:10     STUDIES:  Cranial ct scan 10/21 > (-) acute changes  CULTURES: MRSA 10/21 > Blood 10/21 > Urine culture 10/21 >   ANTIBIOTICS: Vanc 10/21 > Zosyn 10/21 >   SIGNIFICANT EVENTS: 10/21 admit for AMS, on top of severe dementia, hypothermic, in resp distress.   LINES/TUBES:   DISCUSSION: 69 female, admitted from nursing home, comes in with altered mental status, respiratory distress, hypothermia. Chest x-ray with possible pulmonary congestion. Possible healthcare pneumonia as well. TSH was very  elevated. Cranial CT scan was (-) for acute changes.   ASSESSMENT / PLAN: Acute Hypoxemic Hypercapneic Respiratory Failure 2/2 HCAP, Possible Acute CHF exacerbation (diastolic) Concern for Myxedema Coma given very elevated TSH R/O Demand ischemia Hypothermia related to infection/hypothyroidism AKI 2/2 infection/hypovolemia.  Advanced Dementia  1. I extensively  discussed patient's overall poor prognosis with her grandchildren in the room. The patient has 3 daughters and a lot of family driving from [facility]. Most of them will be here by noon today. I discussed with them CODE STATUS. Currently she is a DO NOT INTUBATE. They were leaning towards a DO NOT RESUSCITATE or even comfort care once everyone has seen the patient. I told them, let the team know once everyone has seen the patient so we can potentially transition her to comfort care. Grandchildren seem reasonable regarding changing CODE STATUS to comfort care  once everyone  has seen the patient.  2. Continue broad-spectrum  antibiotics with vancomycin and Zosyn. Follow up cultures.  3. Keep o2 sats > 88-90% on NRBM.  4. Lactic acid is reassuring. 5. Try to keep euvolemic. Chest x-ray shows possible congestion  but I'm holding off on diuretics for now as her blood pressure is soft. Will need IVF if BP starts dropping and if uo decreases.  6. Keep nothing by mouth. 7. Continue synthroid IV and IV steroids.  8. DVT prophylaxis.   Critical care time with this patient today : 30 minutes.   PCCM will sign off for now.  I anticipate family will transition pt to comfort care today.  Call back if with issues.   Monica Becton, MD 04/10/2016, 12:55 AM [facility] Pager [phone] After 3 pm or if no answer, call [phone]

## 2016-04-10 NOTE — Progress Notes (Signed)
Pharmacy Antibiotic Note  Emily [name] is a 82 y.o. female admitted on 04/09/2016 with sepsis.  Pharmacy has been consulted for vancomycin/Zosyn dosing.  Patient is a 82 year old female with a history of diabetes, dementia, coronary artery disease, CHF and hypertension who presents with altered mental status. She has dementia and is confused at baseline. She is nonambulatory but does sit in a wheelchair. Family notes that over the last 2 days she's had increasing drowsiness and increased confusion.  Today, 04/10/2016  Renal: SCr elevated and worsening  Hypothermia - improved on last two checks  WBC WNL  Weight now < 100kg  Plan:  Zosyn 3.375g IV q8h (4 hour infusion).   Based on update weight and renal fx, adjust vancomycin to 1gm IV q24h  Consider stopping vancomycin based on unrevealing Blood cx and MRSA PCR negative  Height: 5\' 8"  (172.7 cm) Weight: 206 lb 9.1 oz (93.7 kg) IBW/kg (Calculated) : 63.9  Temp (24hrs), Avg:95.6 F (35.3 C), Min:93 F (33.9 C), Max:99.2 F (37.3 C)   Recent Labs Lab 04/09/16 1713 04/09/16 1723 04/09/16 2005 04/09/16 2247 04/10/16 0108 04/10/16 0351  WBC 8.3  --   --   --   --  7.5  CREATININE 1.70*  --   --   --   --  1.80*  LATICACIDVEN  --  1.39 0.45* 1.1 0.8  --     Estimated Creatinine Clearance: 28.3 mL/min (by C-G formula based on SCr of 1.8 mg/dL (H)).    No Known Allergies  Antimicrobials this admission: Vancomycin 10/21 >>  Zosyn 10/21 >>   Dose adjustments this admission: ---  Microbiology results: 10/21 BCx:  10/21 UCx:     Thank you for allowing pharmacy to be a part of this patient's care.  Royetta Asal, PharmD, BCPS Pager [phone] 04/10/2016 2:14 PM

## 2016-04-10 NOTE — Progress Notes (Signed)
Spoke to Dr. Maudie Mercury regarding patient tremors. See orders.  Family at bedside.

## 2016-04-10 NOTE — Progress Notes (Addendum)
[facility] PROGRESS NOTE  Emily [name] [id number] DOB: June 16, 1931 DOA: 04/09/2016  PCP: [name], MD  Brief History/Interval Summary: 82 year old African-American female with a past medical history of diabetes, advanced dementia, coronary artery disease, congestive heart failure of unknown type, hypertension and chronic kidney disease, who lives in a skilled nursing facility and was brought in due to increased lethargy for the past 1-2 days. Patient was found to be hypothermic. There was concern for sepsis. She had elevated TSH levels and there was concern for myxedema,. She was hospitalized for further management.  Reason for Visit: Hypothermia and altered mental status  Consultants: Critical care medicine, has signed off  Procedures: None yet  Antibiotics: Vancomycin and Zosyn  Subjective/Interval History: Patient is unresponsive. She is noted to have some twitching movements. Her daughter is at the bedside.  ROS: Unable to obtain due to alteration in mental status.  Objective:  Vital Signs  Vitals:   04/10/16 0500 04/10/16 0600 04/10/16 0800 04/10/16 0922  BP: (!) 120/54 124/61 (!) 154/69   Pulse:      Resp: 19 (!) 21 13 14   Temp:   98.9 F (37.2 C)   TempSrc:   Oral   SpO2: 100% 98% 98% 96%  Weight:      Height:        Intake/Output Summary (Last 24 hours) at 04/10/16 1029 Last data filed at 04/10/16 0844  Gross per 24 hour  Intake             1153 ml  Output               50 ml  Net             1103 ml   Filed Weights   04/09/16 1730 04/09/16 2213  Weight: 113.4 kg (250 lb) 93.7 kg (206 lb 9.1 oz)    General appearance: Unresponsive. Occasional tremors noted. Does not appear to be in any distress. Resp: Diminished air entry at the bases. No wheezing, rales, rhonchi. Cardio: regular rate and rhythm, S1, S2 normal, no murmur, click, rub or gallop GI: soft, non-tender; bowel sounds normal; no masses,  no organomegaly Extremities:  Minimal edema noted bilateral lower extremities. Pulses: Poorly palpable bilateral lower extremities. Neurologic: Patient does not open her eyes. Does not respond to voice. Does move with painful stimuli. No obvious neuro deficits noted.  Lab Results:  Data Reviewed: I have personally reviewed following labs and imaging studies  CBC:  Recent Labs Lab 04/09/16 1713 04/10/16 0351  WBC 8.3 7.5  NEUTROABS 7.2  --   HGB 10.2* 9.5*  HCT 30.6* 28.7*  MCV 86.0 84.7  PLT 98* 101*    Basic Metabolic Panel:  Recent Labs Lab 04/09/16 1713 04/10/16 0351  NA 147* 148*  K 3.7 3.8  CL 109 113*  CO2 29 30  GLUCOSE 84 69  BUN 22* 22*  CREATININE 1.70* 1.80*  CALCIUM 9.1 8.4*  MG  --  2.0  PHOS  --  5.0*    GFR: Estimated Creatinine Clearance: 28.3 mL/min (by C-G formula based on SCr of 1.8 mg/dL (H)).  Liver Function Tests:  Recent Labs Lab 04/09/16 1713 04/10/16 0351  AST 18 14*  ALT 13* 11*  ALKPHOS 93 84  BILITOT 0.6 0.8  PROT 7.5 6.9  ALBUMIN 3.1* 2.9*     Coagulation Profile:  Recent Labs Lab 04/09/16 2247  INR 1.30    Cardiac Enzymes:  Recent Labs Lab 04/09/16 2247 04/10/16 0351  TROPONINI <0.03 <0.03    CBG:  Recent Labs Lab 04/09/16 1735 04/09/16 2243 04/10/16 0028 04/10/16 0751  GLUCAP 80 98 116* 72    Thyroid Function Tests:  Recent Labs  04/10/16 0351  TSH 6.074*     Recent Results (from the past 240 hour(s))  MRSA PCR Screening     Status: None   Collection Time: 04/09/16 10:39 PM  Result Value Ref Range Status   MRSA by PCR NEGATIVE NEGATIVE Final    Comment:        The GeneXpert MRSA Assay (FDA approved for NASAL specimens only), is one component of a comprehensive MRSA colonization surveillance program. It is not intended to diagnose MRSA infection nor to guide or monitor treatment for MRSA infections.       Radiology Studies: Dg Chest 2 View  Result Date: 04/09/2016 CLINICAL DATA:  Altered mental  status and fever. EXAM: CHEST  2 VIEW COMPARISON:  12/14/2015 FINDINGS: Unable to raise arms. Lungs are adequately inflated without definite airspace consolidation or effusion. There is moderate stable cardiomegaly. There is calcified plaque over the thoracic aorta. There are degenerative changes of the shoulders and spine. IMPRESSION: No acute cardiopulmonary disease. Stable cardiomegaly. Aortic atherosclerosis. Electronically Signed   By: Marin Olp M.D.   On: 04/09/2016 18:54   Ct Head Wo Contrast  Result Date: 04/09/2016 CLINICAL DATA:  Altered mental status. EXAM: CT HEAD WITHOUT CONTRAST TECHNIQUE: Contiguous axial images were obtained from the base of the skull through the vertex without intravenous contrast. COMPARISON:  02/19/2016 head CT. FINDINGS: Brain: Stable densely calcified 2.6 cm meningioma in the right anterior frontal parafalcine convexity with stable local mass effect. No evidence of parenchymal hemorrhage or extra-axial fluid collection. Otherwise no mass lesion, mass effect, or midline shift. No CT evidence of acute infarction. Intracranial atherosclerosis. Generalized cerebral volume loss, most prominent in the bilateral temporal lobes. Nonspecific moderate subcortical and periventricular white matter hypodensity, most in keeping with chronic small vessel ischemic change. Cerebral ventricle sizes are stable and concordant with the degree of cerebral volume loss. Vascular: No hyperdense vessel or unexpected calcification. Skull: No evidence of calvarial fracture. Sinuses/Orbits: The visualized paranasal sinuses are essentially clear. Other:  The mastoid air cells are unopacified. IMPRESSION: 1.  No evidence of acute intracranial abnormality. 2. Stable right anterior frontal convexity meningioma. 3. Generalized cerebral volume loss, most prominent in the bilateral temporal lobes. Moderate chronic small vessel ischemia. Electronically Signed   By: Ilona Sorrel M.D.   On: 04/09/2016 18:51     Dg Chest Port 1 View  Result Date: 04/09/2016 CLINICAL DATA:  Per EMS pt [facility] sent per request of grandson verbalizing patient is "different" since last seen and seems more lethargic. Pt norm is disoriented x4 with hx of dementia. Abnormal lung sounds noted with triage. Hx HTN, Diabetic, CHF. Non-smoker. EXAM: PORTABLE CHEST 1 VIEW COMPARISON:  04/09/2016 FINDINGS: Heart is enlarged. There is central perihilar opacity consistent with pulmonary edema. Small right effusion is noted. Degenerative changes are noted in the shoulders and thoracic spine. Study Quality is degraded by patient positioning. IMPRESSION: 1. Cardiomegaly and mild pulmonary edema. Electronically Signed   By: Nolon Nations M.D.   On: 04/09/2016 22:10     Medications:  Scheduled: . hydrocortisone sod succinate (SOLU-CORTEF) inj  100 mg Intravenous Q8H  . insulin aspart  0-9 Units Subcutaneous Q4H  . levothyroxine  100 mcg Intravenous Daily  . LORazepam  0.5 mg Intravenous Once  . piperacillin-tazobactam (ZOSYN)  IV  3.375 g Intravenous Q8H  . sodium chloride flush  3 mL Intravenous Q12H  . vancomycin  1,250 mg Intravenous Q24H   Continuous:  [id number] **OR** acetaminophen, ondansetron **OR** ondansetron (ZOFRAN) IV  Assessment/Plan:  Active Problems:   DM (diabetes mellitus), type 2 with renal complications (HCC)   Essential hypertension   Coronary atherosclerosis   Peptic ulcer   Acute encephalopathy   Hypotension   Septic shock (HCC)   Myxedema coma (HCC)   Bradycardia   Acute hypoxemic respiratory failure ([place])   HCAP (healthcare-associated pneumonia)     Hypothermia Thought to be secondary to sepsis versus myxedema coma. Cortisol level is greater than 100. However, it looks like this level was checked after the patient was given hydrocortisone, so may not be reliable. TSH was elevated at 7. Free T4 is 0.9. It is in the normal range. However, this level was checked, it appears,  after the patient was given IV Synthroid, so level may not be reliable. She is on IV Synthroid currently. She is on IV steroids as well. Temperature has improved with warming blankets. Patient's family does not want aggressive measures. But okay to continue with current line of treatment.  Acute respiratory failure with hypoxia. Patient was hypoxic. She required nonrebreather. Currently saturations seem to be okay. Have instructed the nurse to wean her down on her oxygen. Chest x-ray suggested pulmonary edema. Continue to monitor closely. She is a DO NOT RESUSCITATE. Prognosis is poor.  Questionable Sepsis of unknown source There was some concern for sepsis due to her hypothermia. Cultures have been drawn. She is on broad-spectrum antibiotics. No obvious source of infection is identified. Continue to monitor closely for now.  Hypotension. Blood pressure was apparently low in the emergency department. Now much improved.  Acute on chronic kidney disease stage 2 Creatinine is noted to be elevated. Monitor urine output. Foley catheter to be placed. Caution with IV fluids due to concern for pulmonary edema noted on chest x-ray.  Acute encephalopathy in the setting of hypothermia with known history of advanced Alzheimer's dementia Patient's mental status remains altered. She is poorly responsive. CT scan of the head did not show any acute findings.  History of advanced Alzheimer's dementia. Patient remains encephalopathic. All of her home medications are on hold. She is noted to be on Depakote at home. Will check a Depakote level.   History of coronary artery disease. Stable. Troponins are normal.  History of diabetes mellitus type 2 with renal complications Continue to monitor CBGs. No recent HbA1c is in our system. Sliding scale coverage.  Essential hypertension As above. Blood pressures have stabilized. Continue to monitor. Continue to hold blood pressure lowering agents.  Normocytic  anemia Some drop in hemoglobin is due to dilution. No overt bleeding has been noted.  Thrombocytopenia. Low platelet counts are  likely due to sepsis. No evidence for overt bleeding. Continue to monitor. Avoid heparin products.  DVT Prophylaxis: SCDs    Code Status: DO NOT RESUSCITATE  Family Communication: Discussed with the patient's daughter  Disposition Plan: Continue management as outlined above. Palliative medicine has been consulted as well. Family is aware of poor prognosis. If she does not improve or declines, comfort measures will be initiated. Should be okay to transfer her to the floor later today.    LOS: 1 day   [facility] Pager [phone] 04/10/2016, 10:29 AM  If 7PM-7AM, please contact night-coverage at [email or web address], password [facility]

## 2016-04-10 NOTE — Progress Notes (Signed)
[place] Progress Note Patient Name: Emily [name] DOB: 1930-08-30 MRN: [id number]   Date of Service  04/10/2016  HPI/Events of Note  Discussion via phone and camera with [name], patient's daughter and HCPOA.  Reviewed patient's current clinical status in the setting of advanced dementia, CHF and DM.  She is in agreement that aggressive interventions such as ACLS/pressors are not warranted and her mother would agree with a full DNR.  eICU Interventions  Plan: Order for DNR  Further decision regarding care management per rounding teams.     Intervention Category Major Interventions: End of life / care limitation discussion  [place] 04/10/2016, 2:26 AM

## 2016-04-11 DIAGNOSIS — E870 Hyperosmolality and hypernatremia: Secondary | ICD-10-CM

## 2016-04-11 LAB — HEMOGLOBIN A1C
HEMOGLOBIN A1C: 6.2 % — AB (ref 4.8–5.6)
Mean Plasma Glucose: 131 mg/dL

## 2016-04-11 LAB — GLUCOSE, CAPILLARY
GLUCOSE-CAPILLARY: 87 mg/dL (ref 65–99)
GLUCOSE-CAPILLARY: 91 mg/dL (ref 65–99)
GLUCOSE-CAPILLARY: 117 mg/dL — AB (ref 65–99)
GLUCOSE-CAPILLARY: 128 mg/dL — AB (ref 65–99)
Glucose-Capillary: 106 mg/dL — ABNORMAL HIGH (ref 65–99)
Glucose-Capillary: 129 mg/dL — ABNORMAL HIGH (ref 65–99)

## 2016-04-11 LAB — CBC
HEMATOCRIT: 29.6 % — AB (ref 36.0–46.0)
Hemoglobin: 9.9 g/dL — ABNORMAL LOW (ref 12.0–15.0)
MCH: 28.9 pg (ref 26.0–34.0)
MCHC: 33.4 g/dL (ref 30.0–36.0)
MCV: 86.5 fL (ref 78.0–100.0)
PLATELETS: 96 10*3/uL — AB (ref 150–400)
RBC: 3.42 MIL/uL — ABNORMAL LOW (ref 3.87–5.11)
RDW: 15.5 % (ref 11.5–15.5)
WBC: 5 10*3/uL (ref 4.0–10.5)

## 2016-04-11 LAB — COMPREHENSIVE METABOLIC PANEL
ALBUMIN: 2.7 g/dL — AB (ref 3.5–5.0)
ALT: 9 U/L — ABNORMAL LOW (ref 14–54)
ANION GAP: 7 (ref 5–15)
AST: 11 U/L — AB (ref 15–41)
Alkaline Phosphatase: 73 U/L (ref 38–126)
BUN: 19 mg/dL (ref 6–20)
CHLORIDE: 115 mmol/L — AB (ref 101–111)
CO2: 29 mmol/L (ref 22–32)
Calcium: 8.8 mg/dL — ABNORMAL LOW (ref 8.9–10.3)
Creatinine, Ser: 1.51 mg/dL — ABNORMAL HIGH (ref 0.44–1.00)
GFR calc Af Amer: 36 mL/min — ABNORMAL LOW (ref >60)
GFR calc non Af Amer: 31 mL/min — ABNORMAL LOW (ref >60)
GLUCOSE: 90 mg/dL (ref 65–99)
POTASSIUM: 3.5 mmol/L (ref 3.5–5.1)
SODIUM: 151 mmol/L — AB (ref 135–145)
Total Bilirubin: 0.5 mg/dL (ref 0.3–1.2)
Total Protein: 6.7 g/dL (ref 6.5–8.1)

## 2016-04-11 LAB — T3: T3, Total: 62 ng/dL — ABNORMAL LOW (ref 71–180)

## 2016-04-11 MED ORDER — DEXTROSE 5 % IV SOLN
INTRAVENOUS | Status: DC
  Administered 2016-04-11 – 2016-04-16 (×4): via INTRAVENOUS

## 2016-04-11 MED ORDER — INSULIN ASPART 100 UNIT/ML ~~LOC~~ SOLN
0.0000 [IU] | Freq: Three times a day (TID) | SUBCUTANEOUS | Status: DC
  Administered 2016-04-12: 3 [IU] via SUBCUTANEOUS
  Administered 2016-04-12 – 2016-04-13 (×2): 1 [IU] via SUBCUTANEOUS
  Administered 2016-04-13: 2 [IU] via SUBCUTANEOUS
  Administered 2016-04-14 – 2016-04-15 (×2): 3 [IU] via SUBCUTANEOUS
  Administered 2016-04-15: 1 [IU] via SUBCUTANEOUS

## 2016-04-11 NOTE — Evaluation (Signed)
Clinical/Bedside Swallow Evaluation Patient Details  Name: Emily [name] MRN: [id number] Date of Birth: 1930/08/04  Today's Date: 04/11/2016 Time: SLP Start Time (ACUTE ONLY): 1150 SLP Stop Time (ACUTE ONLY): 1206 SLP Time Calculation (min) (ACUTE ONLY): 16 min  Past Medical History:  Past Medical History:  Diagnosis Date  . Alzheimer disease   . CAD (coronary artery disease)    Moderate coronary artery disease status post angioplasty to the right coronary artery in 2003 and an ejection fraction 46% on catheterization in 2006.  . Diabetes mellitus   . Duodenal erosion 2009   EGD by Dr. [name]  . Gastric erosion 2009   EGD by Dr. [name]  . Hypertension   . Osteoarthritis   . Perforated ulcer ([place]) 2002   Perforated pyloric channel ulcer patched by Dr. [name]  . Psoriasis   . Systolic CHF ([place])    EF as low is 45%, but 55% in 2009   Past Surgical History:  Past Surgical History:  Procedure Laterality Date  . TOTAL HIP ARTHROPLASTY Right [id number]   HPI:  82 year old African-American female with a past medical history of diabetes, advanced dementia, coronary artery disease, congestive heart failure of unknown type, hypertension and chronic kidney disease, who lives in a skilled nursing facility and was brought in due to increased lethargy for the past 1-2 days. Patient was found to be hypothermic. There was concern for sepsis. She had elevated TSH levels and there was concern for myxedema. She was hospitalized for further management. Palliative Medicine consulted.  Per daughter's report, pt requires total assistance with feeding, but has not demonstrated difficulty with eating in the past.    Assessment / Plan / Recommendation Clinical Impression  Pt much more alert this afternoon per family.  She demonstrated adequate anticipation/attention to approaching cup/utensils; mildly prolonged but effective mastication of purees; no s/s of aspiration of thin liquids, even when taxed with large,  successive boluses.  After discussion with daughter, agreed to begin pureed diet with thin liquids; if she tolerates well, can likely advance solids.  Will require total assistance with feeding.  Pt was very motivated to eat during assessment and clearly derived pleasure from the trial POs.     Aspiration Risk  Mild aspiration risk    Diet Recommendation   dysphagia 1, thin liquids.  Straws okay.   Medication Administration: Whole meds with puree    Other  Recommendations Oral Care Recommendations: Oral care BID   Follow up Recommendations None      Frequency and Duration min 1 x/week  1 week       Prognosis Prognosis for Safe Diet Advancement: Fair      Swallow Study   General HPI: 82 year old African-American female with a past medical history of diabetes, advanced dementia, coronary artery disease, congestive heart failure of unknown type, hypertension and chronic kidney disease, who lives in a skilled nursing facility and was brought in due to increased lethargy for the past 1-2 days. Patient was found to be hypothermic. There was concern for sepsis. She had elevated TSH levels and there was concern for myxedema,. She was hospitalized for further management. Palliative Medicine consulted.  Per daughter's report, pt requires total assistance with feeding, but has not demonstrated difficulty with eating in the past  Type of Study: Bedside Swallow Evaluation Previous Swallow Assessment: no Diet Prior to this Study: NPO Temperature Spikes Noted: No Respiratory Status: Room air History of Recent Intubation: No Behavior/Cognition: Alert;Confused Oral Cavity Assessment: Within Functional Limits Oral  Care Completed by SLP: No Oral Cavity - Dentition: Missing dentition Self-Feeding Abilities: Total assist Patient Positioning: Upright in bed Baseline Vocal Quality: Normal Volitional Cough: Cognitively unable to elicit Volitional Swallow: Unable to elicit    Oral/Motor/Sensory  Function Overall Oral Motor/Sensory Function: Within functional limits   Ice Chips Ice chips: Not tested   Thin Liquid Thin Liquid: Within functional limits Presentation: Straw    Nectar Thick Nectar Thick Liquid: Not tested   Honey Thick Honey Thick Liquid: Not tested   Puree Puree: Within functional limits Presentation: Spoon   Solid   GO   Solid: Not tested       Estill Bamberg L. Tivis Ringer, [place] CCC/SLP Pager [phone]  [name] 04/11/2016,12:17 PM

## 2016-04-11 NOTE — Progress Notes (Signed)
RN notified

## 2016-04-11 NOTE — Progress Notes (Signed)
[facility] PROGRESS NOTE  Emily [name] [id number] DOB: [birth year] DOA: 04/09/2016  PCP: [name], MD  Brief History/Interval Summary: 82 year old African-American female with a past medical history of diabetes, advanced dementia, coronary artery disease, congestive heart failure of unknown type, hypertension and chronic kidney disease, who lives in a skilled nursing facility and was brought in due to increased lethargy for the past 1-2 days. Patient was found to be hypothermic. There was concern for sepsis. She had elevated TSH levels and there was concern for myxedema. She was hospitalized for further management.  Reason for Visit: Hypothermia and altered mental status  Consultants: Critical care medicine has signed off  Procedures: None yet  Antibiotics: Vancomycin stopped on 10/23 Zosyn  Subjective/Interval History: Patient is much more awake and alert today. She has family members at her bedside. Still remains confused and distracted.   ROS: Unable to obtain due to alteration in mental status.  Objective:  Vital Signs  Vitals:   04/10/16 1600 04/10/16 1703 04/10/16 2124 04/11/16 0533  BP: (!) 141/71 (!) 153/69 140/61 132/83  Pulse:  65 60 65  Resp: 14 18 19 18   Temp:  97.4 F (36.3 C) 97.4 F (36.3 C) 97.8 F (36.6 C)  TempSrc:  Axillary Axillary Axillary  SpO2: 100% 99% 99% 100%  Weight:  97.5 kg (214 lb 15.2 oz)    Height:        Intake/Output Summary (Last 24 hours) at 04/11/16 1018 Last data filed at 04/11/16 0900  Gross per 24 hour  Intake              300 ml  Output              700 ml  Net             -400 ml   Filed Weights   04/09/16 1730 04/09/16 2213 04/10/16 1703  Weight: 113.4 kg (250 lb) 93.7 kg (206 lb 9.1 oz) 97.5 kg (214 lb 15.2 oz)    General appearance: Awake and more responsive today. Occasional tremors noted. Does not appear to be in any distress. Resp: Diminished air entry at the bases. No wheezing, rales,  rhonchi. Cardio: regular rate and rhythm, S1, S2 normal, no murmur, click, rub or gallop GI: soft, non-tender; bowel sounds normal; no masses,  no organomegaly Extremities: Minimal edema noted bilateral lower extremities. Neurologic: Awake and responsive today. No obvious neurological deficits noted. Generalized weakness is appreciated. Follow certain commands, but not others.   Lab Results:  Data Reviewed: I have personally reviewed following labs and imaging studies  CBC:  Recent Labs Lab 04/09/16 1713 04/10/16 0351 04/11/16 0520  WBC 8.3 7.5 5.0  NEUTROABS 7.2  --   --   HGB 10.2* 9.5* 9.9*  HCT 30.6* 28.7* 29.6*  MCV 86.0 84.7 86.5  PLT 98* 101* 96*    Basic Metabolic Panel:  Recent Labs Lab 04/09/16 1713 04/10/16 0351 04/11/16 0520  NA 147* 148* 151*  K 3.7 3.8 3.5  CL 109 113* 115*  CO2 29 30 29   GLUCOSE 84 69 90  BUN 22* 22* 19  CREATININE 1.70* 1.80* 1.51*  CALCIUM 9.1 8.4* 8.8*  MG  --  2.0  --   PHOS  --  5.0*  --     GFR: Estimated Creatinine Clearance: 34.4 mL/min (by C-G formula based on SCr of 1.51 mg/dL (H)).  Liver Function Tests:  Recent Labs Lab 04/09/16 1713 04/10/16 0351 04/11/16 0520  AST 18  14* 11*  ALT 13* 11* 9*  ALKPHOS 93 84 73  BILITOT 0.6 0.8 0.5  PROT 7.5 6.9 6.7  ALBUMIN 3.1* 2.9* 2.7*     Coagulation Profile:  Recent Labs Lab 04/09/16 2247  INR 1.30    Cardiac Enzymes:  Recent Labs Lab 04/09/16 2247 04/10/16 0351 04/10/16 1050  TROPONINI <0.03 <0.03 <0.03    CBG:  Recent Labs Lab 04/10/16 1608 04/10/16 2122 04/11/16 0045 04/11/16 0522 04/11/16 0933  GLUCAP 86 150* 106* 87 91    Thyroid Function Tests:  Recent Labs  04/09/16 2247 04/10/16 0351  TSH  --  6.074*  FREET4 0.94  --      Recent Results (from the past 240 hour(s))  Blood Culture (routine x 2)     Status: None (Preliminary result)   Collection Time: 04/09/16  5:13 PM  Result Value Ref Range Status   Specimen Description  BLOOD LEFT HAND  Final   Special Requests IN PEDIATRIC BOTTLE [place]  Final   Culture   Final    NO GROWTH < 24 HOURS Performed at [facility]    Report Status PENDING  Incomplete  Urine culture     Status: None   Collection Time: 04/09/16  5:50 PM  Result Value Ref Range Status   Specimen Description URINE, CLEAN CATCH  Final   Special Requests NONE  Final   Culture NO GROWTH Performed at [facility]   Final   Report Status 04/10/2016 FINAL  Final  Blood Culture (routine x 2)     Status: None (Preliminary result)   Collection Time: 04/09/16  5:55 PM  Result Value Ref Range Status   Specimen Description BLOOD RIGHT WRIST  Final   Special Requests BOTTLES DRAWN AEROBIC AND ANAEROBIC 5CC  Final   Culture   Final    NO GROWTH < 24 HOURS Performed at [facility]    Report Status PENDING  Incomplete  MRSA PCR Screening     Status: None   Collection Time: 04/09/16 10:39 PM  Result Value Ref Range Status   MRSA by PCR NEGATIVE NEGATIVE Final    Comment:        The GeneXpert MRSA Assay (FDA approved for NASAL specimens only), is one component of a comprehensive MRSA colonization surveillance program. It is not intended to diagnose MRSA infection nor to guide or monitor treatment for MRSA infections.       Radiology Studies: Dg Chest 2 View  Result Date: 04/09/2016 CLINICAL DATA:  Altered mental status and fever. EXAM: CHEST  2 VIEW COMPARISON:  12/14/2015 FINDINGS: Unable to raise arms. Lungs are adequately inflated without definite airspace consolidation or effusion. There is moderate stable cardiomegaly. There is calcified plaque over the thoracic aorta. There are degenerative changes of the shoulders and spine. IMPRESSION: No acute cardiopulmonary disease. Stable cardiomegaly. Aortic atherosclerosis. Electronically Signed   By: Marin Olp M.D.   On: 04/09/2016 18:54   Ct Head Wo Contrast  Result Date: 04/09/2016 CLINICAL DATA:  Altered mental  status. EXAM: CT HEAD WITHOUT CONTRAST TECHNIQUE: Contiguous axial images were obtained from the base of the skull through the vertex without intravenous contrast. COMPARISON:  02/19/2016 head CT. FINDINGS: Brain: Stable densely calcified 2.6 cm meningioma in the right anterior frontal parafalcine convexity with stable local mass effect. No evidence of parenchymal hemorrhage or extra-axial fluid collection. Otherwise no mass lesion, mass effect, or midline shift. No CT evidence of acute infarction. Intracranial atherosclerosis. Generalized cerebral volume  loss, most prominent in the bilateral temporal lobes. Nonspecific moderate subcortical and periventricular white matter hypodensity, most in keeping with chronic small vessel ischemic change. Cerebral ventricle sizes are stable and concordant with the degree of cerebral volume loss. Vascular: No hyperdense vessel or unexpected calcification. Skull: No evidence of calvarial fracture. Sinuses/Orbits: The visualized paranasal sinuses are essentially clear. Other:  The mastoid air cells are unopacified. IMPRESSION: 1.  No evidence of acute intracranial abnormality. 2. Stable right anterior frontal convexity meningioma. 3. Generalized cerebral volume loss, most prominent in the bilateral temporal lobes. Moderate chronic small vessel ischemia. Electronically Signed   By: Ilona Sorrel M.D.   On: 04/09/2016 18:51   Dg Chest Port 1 View  Result Date: 04/09/2016 CLINICAL DATA:  Per EMS pt [facility] sent per request of grandson verbalizing patient is "different" since last seen and seems more lethargic. Pt norm is disoriented x4 with hx of dementia. Abnormal lung sounds noted with triage. Hx HTN, Diabetic, CHF. Non-smoker. EXAM: PORTABLE CHEST 1 VIEW COMPARISON:  04/09/2016 FINDINGS: Heart is enlarged. There is central perihilar opacity consistent with pulmonary edema. Small right effusion is noted. Degenerative changes are noted in the shoulders and thoracic  spine. Study Quality is degraded by patient positioning. IMPRESSION: 1. Cardiomegaly and mild pulmonary edema. Electronically Signed   By: Nolon Nations M.D.   On: 04/09/2016 22:10     Medications:  Scheduled: . hydrocortisone sod succinate (SOLU-CORTEF) inj  100 mg Intravenous Q8H  . insulin aspart  0-9 Units Subcutaneous Q4H  . levothyroxine  100 mcg Intravenous Daily  . LORazepam  0.5 mg Intravenous Once  . piperacillin-tazobactam (ZOSYN)  IV  3.375 g Intravenous Q8H  . sodium chloride flush  3 mL Intravenous Q12H   Continuous:  [id number] **OR** acetaminophen, ondansetron **OR** ondansetron (ZOFRAN) IV  Assessment/Plan:  Active Problems:   DM (diabetes mellitus), type 2 with renal complications (HCC)   Essential hypertension   Coronary atherosclerosis   Peptic ulcer   Acute encephalopathy   Hypotension   Septic shock (HCC)   Myxedema coma (HCC)   Bradycardia   Acute hypoxemic respiratory failure ([place])   HCAP (healthcare-associated pneumonia)     Hypothermia Thought to be secondary to sepsis versus myxedema coma. Cortisol level is greater than 100. However, it looks like this level was checked after the patient was given hydrocortisone, so may not be reliable. TSH was elevated at 7. Free T4 is 0.9. It is in the normal range. However, this level was checked, it appears, after the patient was given IV Synthroid, so level may not be reliable. She is on IV Synthroid currently. She is on IV steroids as well. Temperature has improved. Patient's family does not want aggressive measures. But okay to continue with current line of treatment.  Acute respiratory failure with hypoxia. Patient seems to have improved. Currently on oxygen only by nasal cannula.   Questionable Sepsis of unknown source There was some concern for sepsis due to her hypothermia. The cultures are negative so far. Okay to stop vancomycin. Continue Zosyn for now.   Hypotension. Blood pressure was  apparently low in the emergency department. Now much improved.  Acute on chronic kidney disease stage 2 Creatinine is improved this morning. Continue to monitor urine output. Continue with Foley catheter for now.   Hypernatremia Likely due to poor free water intake. Swallow evaluation has been ordered. Lower rate D5 water infusion to be ordered.  Acute encephalopathy in the setting of hypothermia with known  history of advanced Alzheimer's dementia Patient's mental status has improved. However, she remains confused. CT scan of the head did not show any acute findings.  History of advanced Alzheimer's dementia. All of her home medications are on hold. She is noted to be on Depakote at home. Depakote level was 19.   History of coronary artery disease. Stable. Troponins are normal.  History of diabetes mellitus type 2 with renal complications Continue to monitor CBGs. No recent HbA1c is in our system. Sliding scale coverage.  Essential hypertension As above. Blood pressures have stabilized. Continue to monitor. Continue to hold blood pressure lowering agents.  Normocytic anemia Some drop in hemoglobin is due to dilution. No overt bleeding has been noted.  Thrombocytopenia. Low platelet counts are  likely due to sepsis. No evidence for overt bleeding. Continue to monitor. Avoid heparin products.  DVT Prophylaxis: SCDs    Code Status: DO NOT RESUSCITATE  Family Communication: Discussed with the patient's daughter  Disposition Plan: Continue management as outlined above. Await swallow evaluation. Stop vancomycin. De-escalate Zosyn in the next 24-48 hours. Palliative medicine to continue to follow. Discussed with Dr. Rowe Pavy today.     LOS: 2 days   [facility] Pager [phone] 04/11/2016, 10:18 AM  If 7PM-7AM, please contact night-coverage at [email or web address], password [facility]

## 2016-04-11 NOTE — Progress Notes (Signed)
Daily Progress Note   Patient Name: Emily [name]       Date: 04/11/2016 DOB: Apr 27, 1931  Age: 82 y.o. MRN#: [id number] Attending Physician: Bonnielee Haff, MD Primary Care Physician: [name], MD Admit Date: 04/09/2016  Reason for Consultation/Follow-up: Establishing goals of care  82 year old African-American female with a past medical history of diabetes, advanced dementia, coronary artery disease, congestive heart failure of unknown type, hypertension and chronic kidney disease, who lives in a skilled nursing facility and was brought in due to increased lethargy for the past 1-2 days. Patient was found to be hypothermic. There was concern for sepsis. She had elevated TSH levels and there was concern for myxedema. She was hospitalized for further management.  Subjective:  patient is able to squeeze my hand on command, is able to open eyes when name is called.  It is reported that she is more awake now, is able to verbalize some as well.     Length of Stay: 2  Current Medications: Scheduled Meds:  . hydrocortisone sod succinate (SOLU-CORTEF) inj  100 mg Intravenous Q8H  . insulin aspart  0-9 Units Subcutaneous Q4H  . levothyroxine  100 mcg Intravenous Daily  . LORazepam  0.5 mg Intravenous Once  . piperacillin-tazobactam (ZOSYN)  IV  3.375 g Intravenous Q8H  . sodium chloride flush  3 mL Intravenous Q12H    Continuous Infusions: . dextrose 50 mL/hr at 04/11/16 1224    PRN Meds: acetaminophen **OR** acetaminophen, ondansetron **OR** ondansetron (ZOFRAN) IV  Physical Exam         Awake and more responsive today S1 S2 Diminished bases, has a coarse cough Abdomen soft Trace edema  Vital Signs: BP 132/83 (BP Location: Left Arm)   Pulse 65   Temp 97.8 F (36.6  C) (Axillary)   Resp 18   Ht 5\' 8"  (1.727 m)   Wt 97.5 kg (214 lb 15.2 oz)   SpO2 100%   BMI 32.68 kg/m  SpO2: SpO2: 100 % O2 Device: O2 Device: Nasal Cannula O2 Flow Rate: O2 Flow Rate (L/min): 4 L/min  Intake/output summary:  Intake/Output Summary (Last 24 hours) at 04/11/16 1325 Last data filed at 04/11/16 1022  Gross per 24 hour  Intake              300 ml  Output  925 ml  Net             -625 ml   LBM: Last BM Date: 04/10/16 Baseline Weight: Weight: 113.4 kg (250 lb) Most recent weight: Weight: 97.5 kg (214 lb 15.2 oz)       Palliative Assessment/Data:    Flowsheet Rows   Flowsheet Row Most Recent Value  Intake Tab  Referral Department  Hospitalist  Unit at Time of Referral  Med/Surg Unit  Palliative Care Primary Diagnosis  Other (Comment)  Palliative Care Type  Return patient Palliative Care  Reason for referral  Clarify Goals of Care  Date first seen by Palliative Care  04/10/16  Clinical Assessment  Palliative Performance Scale Score  20%  Pain Max last 24 hours  4  Pain Min Last 24 hours  3  Dyspnea Max Last 24 Hours  4  Dyspnea Min Last 24 hours  3  Nausea Max Last 24 Hours  3  Nausea Min Last 24 Hours  2  Anxiety Max Last 24 Hours  3  Psychosocial & Spiritual Assessment  Palliative Care Outcomes  Patient/Family meeting held?  Yes  Who was at the meeting?  daughter and 2 grand daughters present in the room.   Palliative Care Outcomes  Clarified goals of care      Patient Active Problem List   Diagnosis Date Noted  . Bradycardia   . Acute hypoxemic respiratory failure ([place])   . HCAP (healthcare-associated pneumonia)   . Acute encephalopathy 04/09/2016  . Hypotension 04/09/2016  . Septic shock ([place]) 04/09/2016  . Myxedema coma ([place]) 04/09/2016  . Acute GI bleeding 03/04/2013  . MICROCYTOSIS 07/29/2009  . DIVERTICULOSIS, COLON 09/14/2007  . DUODENITIS, WITHOUT HEMORRHAGE 08/17/2007  . CHEST PAIN 08/15/2007  . ADENOMATOUS COLONIC  POLYP 05/31/2007  . Coronary atherosclerosis 05/31/2007  . HYPERLIPIDEMIA 08/09/2006  . DM (diabetes mellitus), type 2 with renal complications ([place]) [id number]  . HYPERCHOLESTEROLEMIA 06/29/2006  . OBESITY 06/29/2006  . Essential hypertension 06/29/2006  . Peptic ulcer 06/29/2006  . PSORIASIS 06/29/2006  . DEGENERATIVE JOINT DISEASE 06/29/2006  . SYMPTOM, MEMORY LOSS 06/29/2006  . WEIGHT LOSS 06/29/2006  . DEPRESSION, HX OF 06/29/2006  . HIP REPLACEMENT, TOTAL, HX OF 06/29/2006  . HYSTERECTOMY, HX OF 06/29/2006  . PERCUTANEOUS TRANSLUMINAL CORONARY ANGIOPLASTY, HX OF 06/29/2006  . CARPAL TUNNEL RELEASE, HX OF 06/29/2006    Palliative Care Assessment & Plan   Patient Profile:    Assessment:  advanced alzheimer's dementia Acute on chronic stage II CKD Hypothermia, acute hypoxic respiratory failure: ? Myxedema versus sepsis.   Recommendations/Plan:   time trial of current therapies to continue, family on board with considering transfer to residential hospice if the patient does not improve.    Code Status:    Code Status Orders        Start     Ordered   04/10/16 0226  Do not attempt resuscitation (DNR)  Continuous    Question Answer Comment  In the event of cardiac or respiratory ARREST Do not call a "code blue"   In the event of cardiac or respiratory ARREST Do not perform Intubation, CPR, defibrillation or ACLS   In the event of cardiac or respiratory ARREST Use medication by any route, position, wound care, and other measures to relive pain and suffering. May use oxygen, suction and manual treatment of airway obstruction as needed for comfort.      04/10/16 0225    Code Status History    Date Active Date  Inactive Code Status Order ID Comments User Context   04/09/2016 10:28 PM 04/10/2016  2:25 AM Partial Code [id number]  Toy Baker, MD Inpatient   03/03/2013 11:25 PM 03/05/2013  7:19 PM DNR [id number]  Othella Boyer, MD Inpatient   03/03/2013 11:10 PM  03/03/2013 11:25 PM Full Code [id number]  Othella Boyer, MD Inpatient    Advance Directive Documentation   Flowsheet Row Most Recent Value  Type of Advance Directive  Living will, Out of facility DNR (pink MOST or yellow form)  Pre-existing out of facility DNR order (yellow form or pink MOST form)  No data  "MOST" Form in Place?  No data       Prognosis:   guarded   Discharge Planning:  To Be Determined  Care plan was discussed with  Dr [place] Pink, daughter and 2 grand daughters present in the room  Thank you for allowing the Palliative Medicine Team to assist in the care of this patient.   Time In: 9 Time Out: 925 Total Time 25 Prolonged Time Billed  no       Greater than 50%  of this time was spent counseling and coordinating care related to the above assessment and plan.  Loistine Chance, MD [phone]  Please contact Palliative Medicine Team phone at [phone] for questions and concerns.

## 2016-04-11 NOTE — Consult Note (Signed)
CSW consult as pt admitted from a facility. Pt admitted from [facility]. Per chart review, possible hospice consult. CSW will follow.  Wandra Feinstein, MSW, [place]

## 2016-04-12 ENCOUNTER — Encounter (HOSPITAL_COMMUNITY): Payer: Self-pay

## 2016-04-12 DIAGNOSIS — Z7189 Other specified counseling: Secondary | ICD-10-CM

## 2016-04-12 DIAGNOSIS — J189 Pneumonia, unspecified organism: Secondary | ICD-10-CM

## 2016-04-12 DIAGNOSIS — Z515 Encounter for palliative care: Secondary | ICD-10-CM

## 2016-04-12 DIAGNOSIS — N179 Acute kidney failure, unspecified: Secondary | ICD-10-CM

## 2016-04-12 LAB — CBC
HCT: 29.4 % — ABNORMAL LOW (ref 36.0–46.0)
HEMOGLOBIN: 9.7 g/dL — AB (ref 12.0–15.0)
MCH: 28.4 pg (ref 26.0–34.0)
MCHC: 33 g/dL (ref 30.0–36.0)
MCV: 86.2 fL (ref 78.0–100.0)
Platelets: 92 10*3/uL — ABNORMAL LOW (ref 150–400)
RBC: 3.41 MIL/uL — AB (ref 3.87–5.11)
RDW: 15.6 % — ABNORMAL HIGH (ref 11.5–15.5)
WBC: 5.2 10*3/uL (ref 4.0–10.5)

## 2016-04-12 LAB — GLUCOSE, CAPILLARY
GLUCOSE-CAPILLARY: 141 mg/dL — AB (ref 65–99)
GLUCOSE-CAPILLARY: 201 mg/dL — AB (ref 65–99)
Glucose-Capillary: 101 mg/dL — ABNORMAL HIGH (ref 65–99)

## 2016-04-12 LAB — BASIC METABOLIC PANEL
ANION GAP: 7 (ref 5–15)
BUN: 20 mg/dL (ref 6–20)
CHLORIDE: 112 mmol/L — AB (ref 101–111)
CO2: 27 mmol/L (ref 22–32)
Calcium: 8.7 mg/dL — ABNORMAL LOW (ref 8.9–10.3)
Creatinine, Ser: 1.36 mg/dL — ABNORMAL HIGH (ref 0.44–1.00)
GFR calc non Af Amer: 35 mL/min — ABNORMAL LOW (ref >60)
GFR, EST AFRICAN AMERICAN: 40 mL/min — AB (ref >60)
Glucose, Bld: 196 mg/dL — ABNORMAL HIGH (ref 65–99)
POTASSIUM: 3.3 mmol/L — AB (ref 3.5–5.1)
SODIUM: 146 mmol/L — AB (ref 135–145)

## 2016-04-12 MED ORDER — LEVOTHYROXINE SODIUM 50 MCG PO TABS
50.0000 ug | ORAL_TABLET | Freq: Every day | ORAL | Status: DC
  Administered 2016-04-12 – 2016-04-16 (×5): 50 ug via ORAL
  Filled 2016-04-12 (×5): qty 1

## 2016-04-12 MED ORDER — CEFPODOXIME PROXETIL 200 MG PO TABS
200.0000 mg | ORAL_TABLET | Freq: Two times a day (BID) | ORAL | Status: DC
  Administered 2016-04-12 – 2016-04-16 (×9): 200 mg via ORAL
  Filled 2016-04-12 (×10): qty 1

## 2016-04-12 MED ORDER — POTASSIUM CHLORIDE CRYS ER 20 MEQ PO TBCR
20.0000 meq | EXTENDED_RELEASE_TABLET | Freq: Once | ORAL | Status: AC
  Administered 2016-04-12: 20 meq via ORAL
  Filled 2016-04-12: qty 1

## 2016-04-12 MED ORDER — DONEPEZIL HCL 10 MG PO TABS
10.0000 mg | ORAL_TABLET | Freq: Every day | ORAL | Status: DC
  Administered 2016-04-12 – 2016-04-15 (×4): 10 mg via ORAL
  Filled 2016-04-12 (×4): qty 1

## 2016-04-12 MED ORDER — HYDROCORTISONE NA SUCCINATE PF 100 MG IJ SOLR
50.0000 mg | Freq: Three times a day (TID) | INTRAMUSCULAR | Status: DC
  Administered 2016-04-12 – 2016-04-13 (×4): 50 mg via INTRAVENOUS
  Filled 2016-04-12 (×4): qty 1

## 2016-04-12 MED ORDER — CARVEDILOL 6.25 MG PO TABS
18.7500 mg | ORAL_TABLET | Freq: Two times a day (BID) | ORAL | Status: DC
  Administered 2016-04-12 – 2016-04-16 (×9): 18.75 mg via ORAL
  Filled 2016-04-12 (×9): qty 1

## 2016-04-12 MED ORDER — HYDRALAZINE HCL 20 MG/ML IJ SOLN
10.0000 mg | Freq: Four times a day (QID) | INTRAMUSCULAR | Status: DC | PRN
  Administered 2016-04-12 – 2016-04-14 (×3): 10 mg via INTRAVENOUS
  Filled 2016-04-12 (×3): qty 1

## 2016-04-12 MED ORDER — LORAZEPAM 2 MG/ML IJ SOLN
1.0000 mg | Freq: Once | INTRAMUSCULAR | Status: DC
Start: 2016-04-12 — End: 2016-04-16

## 2016-04-12 MED ORDER — MEMANTINE HCL ER 14 MG PO CP24
14.0000 mg | ORAL_CAPSULE | Freq: Every day | ORAL | Status: DC
  Administered 2016-04-12 – 2016-04-16 (×5): 14 mg via ORAL
  Filled 2016-04-12 (×5): qty 1

## 2016-04-12 MED ORDER — ALLOPURINOL 300 MG PO TABS
150.0000 mg | ORAL_TABLET | Freq: Every day | ORAL | Status: DC
  Administered 2016-04-12 – 2016-04-16 (×5): 150 mg via ORAL
  Filled 2016-04-12 (×5): qty 1

## 2016-04-12 MED ORDER — AMLODIPINE BESYLATE 10 MG PO TABS
10.0000 mg | ORAL_TABLET | Freq: Every day | ORAL | Status: DC
  Administered 2016-04-12 – 2016-04-16 (×5): 10 mg via ORAL
  Filled 2016-04-12 (×4): qty 1

## 2016-04-12 NOTE — Progress Notes (Signed)
Daily Progress Note   Patient Name: Emily [name]       Date: 04/12/2016 DOB: [birth year]  Age: 82 y.o. MRN#: [id number] Attending Physician: Bonnielee Haff, MD Primary Care Physician: [name], MD Admit Date: 04/09/2016  Reason for Consultation/Follow-up: Establishing goals of care  82 year old African-American female with a past medical history of diabetes, advanced dementia, coronary artery disease, congestive heart failure of unknown type, hypertension and chronic kidney disease, who lives in a skilled nursing facility and was brought in due to increased lethargy for the past 1-2 days. Patient was found to be hypothermic. There was concern for sepsis. She had elevated TSH levels and there was concern for myxedema. She was hospitalized for further management.  Subjective:  Patient is awake this morning but remains confused. She is not really able to participate in long-term goals conversation but does answer questions appropriately about how she is feeling, family, etc.  Discussed with her daughter at the bedside who reports she has seen a significant difference past day or so. Further conversation as below.    Length of Stay: 3  Current Medications: Scheduled Meds:  . allopurinol  150 mg Oral Daily  . amLODipine  10 mg Oral Daily  . carvedilol  18.75 mg Oral BID WC  . cefpodoxime  200 mg Oral Q12H  . donepezil  10 mg Oral QHS  . hydrocortisone sod succinate (SOLU-CORTEF) inj  50 mg Intravenous Q8H  . insulin aspart  0-9 Units Subcutaneous TID WC  . levothyroxine  50 mcg Oral QAC breakfast  . LORazepam  0.5 mg Intravenous Once  . memantine  14 mg Oral Daily  . sodium chloride flush  3 mL Intravenous Q12H    Continuous Infusions: . dextrose 50 mL/hr at 04/11/16  1224    PRN Meds: acetaminophen **OR** acetaminophen, hydrALAZINE, ondansetron **OR** ondansetron (ZOFRAN) IV  Physical Exam         Awake and responsive today S1 S2 Diminished bases, has a coarse cough Abdomen soft Trace edema  Vital Signs: BP (!) 144/85 (BP Location: Left Arm)   Pulse 85   Temp 98 F (36.7 C)   Resp 18   Ht 5\' 8"  (1.727 m)   Wt 97.5 kg (214 lb 15.2 oz)   SpO2 96%   BMI 32.68 kg/m  SpO2: SpO2: 96 %  O2 Device: O2 Device: Not Delivered O2 Flow Rate: O2 Flow Rate (L/min): 4 L/min  Intake/output summary:   Intake/Output Summary (Last 24 hours) at 04/12/16 1626 Last data filed at 04/12/16 1300  Gross per 24 hour  Intake              680 ml  Output              700 ml  Net              -20 ml   LBM: Last BM Date: 04/10/16 Baseline Weight: Weight: 113.4 kg (250 lb) Most recent weight: Weight: 97.5 kg (214 lb 15.2 oz)       Palliative Assessment/Data:    Flowsheet Rows   Flowsheet Row Most Recent Value  Intake Tab  Referral Department  Hospitalist  Unit at Time of Referral  Med/Surg Unit  Palliative Care Primary Diagnosis  Other (Comment)  Palliative Care Type  Return patient Palliative Care  Reason for referral  Clarify Goals of Care  Date first seen by Palliative Care  04/10/16  Clinical Assessment  Palliative Performance Scale Score  20%  Pain Max last 24 hours  4  Pain Min Last 24 hours  3  Dyspnea Max Last 24 Hours  4  Dyspnea Min Last 24 hours  3  Nausea Max Last 24 Hours  3  Nausea Min Last 24 Hours  2  Anxiety Max Last 24 Hours  3  Psychosocial & Spiritual Assessment  Palliative Care Outcomes  Patient/Family meeting held?  Yes  Who was at the meeting?  daughter and 2 grand daughters present in the room.   Palliative Care Outcomes  Clarified goals of care      Patient Active Problem List   Diagnosis Date Noted  . Bradycardia   . Acute hypoxemic respiratory failure ([place])   . HCAP (healthcare-associated pneumonia)   . Acute  encephalopathy 04/09/2016  . Hypotension 04/09/2016  . Septic shock ([place]) 04/09/2016  . Myxedema coma ([place]) 04/09/2016  . Acute GI bleeding 03/04/2013  . MICROCYTOSIS 07/29/2009  . DIVERTICULOSIS, COLON 09/14/2007  . DUODENITIS, WITHOUT HEMORRHAGE 08/17/2007  . CHEST PAIN 08/15/2007  . ADENOMATOUS COLONIC POLYP 05/31/2007  . Coronary atherosclerosis 05/31/2007  . HYPERLIPIDEMIA 08/09/2006  . DM (diabetes mellitus), type 2 with renal complications ([place]) [id number]  . HYPERCHOLESTEROLEMIA 06/29/2006  . OBESITY 06/29/2006  . Essential hypertension 06/29/2006  . Peptic ulcer 06/29/2006  . PSORIASIS 06/29/2006  . DEGENERATIVE JOINT DISEASE 06/29/2006  . SYMPTOM, MEMORY LOSS 06/29/2006  . WEIGHT LOSS 06/29/2006  . DEPRESSION, HX OF 06/29/2006  . HIP REPLACEMENT, TOTAL, HX OF 06/29/2006  . HYSTERECTOMY, HX OF 06/29/2006  . PERCUTANEOUS TRANSLUMINAL CORONARY ANGIOPLASTY, HX OF 06/29/2006  . CARPAL TUNNEL RELEASE, HX OF 06/29/2006    Palliative Care Assessment & Plan   Patient Profile:    Assessment:  advanced alzheimer's dementia Acute on chronic stage II CKD Hypothermia, acute hypoxic respiratory failure: ? Myxedema versus sepsis.   Recommendations/Plan:  Patient continues to improve over past 24 hours.  Continue current therapy.  Discussed with daughter at bedside regarding possibility of hospice services on discharge.  While she has improved over the past 24 hours, she has been declining over the past several weeks to months in regard to her nutrition, cognition, and functional status.. With her multiple comorbid conditions, I would anticipate her overall prognosis to be less than 6 months if her disease follows its natural course and she would probably be well  served by hospice support on discharge.  Code Status:    Code Status Orders        Start     Ordered   04/10/16 0226  Do not attempt resuscitation (DNR)  Continuous    Question Answer Comment  In the event of  cardiac or respiratory ARREST Do not call a "code blue"   In the event of cardiac or respiratory ARREST Do not perform Intubation, CPR, defibrillation or ACLS   In the event of cardiac or respiratory ARREST Use medication by any route, position, wound care, and other measures to relive pain and suffering. May use oxygen, suction and manual treatment of airway obstruction as needed for comfort.      04/10/16 0225    Code Status History    Date Active Date Inactive Code Status Order ID Comments User Context   04/09/2016 10:28 PM 04/10/2016  2:25 AM Partial Code [id number]  Toy Baker, MD Inpatient   03/03/2013 11:25 PM 03/05/2013  7:19 PM DNR [id number]  Othella Boyer, MD Inpatient   03/03/2013 11:10 PM 03/03/2013 11:25 PM Full Code [id number]  Othella Boyer, MD Inpatient    Advance Directive Documentation   Flowsheet Row Most Recent Value  Type of Advance Directive  Living will, Out of facility DNR (pink MOST or yellow form)  Pre-existing out of facility DNR order (yellow form or pink MOST form)  No data  "MOST" Form in Place?  No data       Prognosis:   guarded   Discharge Planning:  To Be Determined  Care plan was discussed with  Patient and daughter  Thank you for allowing the Palliative Medicine Team to assist in the care of this patient.   Time In: 1005 Time Out: 1030 Total Time 25 Prolonged Time Billed  no       Greater than 50%  of this time was spent counseling and coordinating care related to the above assessment and plan.  Micheline Rough, MD [phone]  Please contact Palliative Medicine Team phone at [phone] for questions and concerns.

## 2016-04-12 NOTE — Progress Notes (Signed)
[facility] PROGRESS NOTE  Emily [name] [id number] DOB: Jul 14, 1930 DOA: 04/09/2016  PCP: [name], MD  Brief History/Interval Summary: 82 year old African-American female with a past medical history of diabetes, advanced dementia, coronary artery disease, congestive heart failure of unknown type, hypertension and chronic kidney disease, who lives in a skilled nursing facility and was brought in due to increased lethargy for the past 1-2 days. Patient was found to be hypothermic. There was concern for sepsis. She had elevated TSH levels and there was concern for myxedema coma. She was hospitalized for further management. Patient was given IV Synthroid, daily steroids and placed on broad-spectrum antibiotics. Her mental status has improved over the last 48 hours.  Reason for Visit: Hypothermia and altered mental status  Consultants: Critical care medicine has signed off  Procedures: None yet  Antibiotics: Vancomycin stopped on 10/23 Zosyn stopped on 10/24. Vantin started 10/4  Subjective/Interval History: Patient is much more awake and alert today. Her daughter is at the bedside. She is trying to eat breakfast. Still remains confused.    ROS: Unable to obtain due to alteration in mental status.  Objective:  Vital Signs  Vitals:   04/11/16 1415 04/11/16 2134 04/12/16 0626 04/12/16 0826  BP: (!) 145/61 (!) 168/83 (!) 189/75 (!) 189/83  Pulse: 69 61 (!) 56   Resp: 18  16   Temp: 97.7 F (36.5 C) 98.4 F (36.9 C) 98 F (36.7 C)   TempSrc: Oral Oral Axillary   SpO2: 100% 98% 95%   Weight:      Height:        Intake/Output Summary (Last 24 hours) at 04/12/16 1149 Last data filed at 04/12/16 [id number]  Gross per 24 hour  Intake              660 ml  Output              900 ml  Net             -240 ml   Filed Weights   04/09/16 1730 04/09/16 2213 04/10/16 1703  Weight: 113.4 kg (250 lb) 93.7 kg (206 lb 9.1 oz) 97.5 kg (214 lb 15.2 oz)    General  appearance: Awake and more responsive today. Does not appear to be in any distress. Resp: Diminished air entry at the bases. No wheezing, rales, rhonchi. Cardio: regular rate and rhythm, S1, S2 normal, no murmur, click, rub or gallop GI: soft, non-tender; bowel sounds normal; no masses,  no organomegaly Extremities: Minimal edema noted bilateral lower extremities. Neurologic: Awake and responsive. Follow certain commands, but not others. No obvious neurological deficits noted.   Lab Results:  Data Reviewed: I have personally reviewed following labs and imaging studies  CBC:  Recent Labs Lab 04/09/16 1713 04/10/16 0351 04/11/16 0520 04/12/16 0928  WBC 8.3 7.5 5.0 5.2  NEUTROABS 7.2  --   --   --   HGB 10.2* 9.5* 9.9* 9.7*  HCT 30.6* 28.7* 29.6* 29.4*  MCV 86.0 84.7 86.5 86.2  PLT 98* 101* 96* 92*    Basic Metabolic Panel:  Recent Labs Lab 04/09/16 1713 04/10/16 0351 04/11/16 0520 04/12/16 0928  NA 147* 148* 151* 146*  K 3.7 3.8 3.5 3.3*  CL 109 113* 115* 112*  CO2 29 30 29 27   GLUCOSE 84 69 90 196*  BUN 22* 22* 19 20  CREATININE 1.70* 1.80* 1.51* 1.36*  CALCIUM 9.1 8.4* 8.8* 8.7*  MG  --  2.0  --   --  PHOS  --  5.0*  --   --     GFR: Estimated Creatinine Clearance: 38.2 mL/min (by C-G formula based on SCr of 1.36 mg/dL (H)).  Liver Function Tests:  Recent Labs Lab 04/09/16 1713 04/10/16 0351 04/11/16 0520  AST 18 14* 11*  ALT 13* 11* 9*  ALKPHOS 93 84 73  BILITOT 0.6 0.8 0.5  PROT 7.5 6.9 6.7  ALBUMIN 3.1* 2.9* 2.7*     Coagulation Profile:  Recent Labs Lab 04/09/16 2247  INR 1.30    Cardiac Enzymes:  Recent Labs Lab 04/09/16 2247 04/10/16 0351 04/10/16 1050  TROPONINI <0.03 <0.03 <0.03    CBG:  Recent Labs Lab 04/11/16 0933 04/11/16 1335 04/11/16 1634 04/11/16 2124 04/12/16 0752  GLUCAP 91 129* 117* 128* 141*    Thyroid Function Tests:  Recent Labs  04/09/16 2247 04/10/16 0351  TSH  --  6.074*  FREET4 0.94  --        Recent Results (from the past 240 hour(s))  Blood Culture (routine x 2)     Status: None (Preliminary result)   Collection Time: 04/09/16  5:13 PM  Result Value Ref Range Status   Specimen Description BLOOD LEFT HAND  Final   Special Requests IN PEDIATRIC BOTTLE [place]  Final   Culture   Final    NO GROWTH 3 DAYS Performed at [facility]    Report Status PENDING  Incomplete  Urine culture     Status: None   Collection Time: 04/09/16  5:50 PM  Result Value Ref Range Status   Specimen Description URINE, CLEAN CATCH  Final   Special Requests NONE  Final   Culture NO GROWTH Performed at [facility]   Final   Report Status 04/10/2016 FINAL  Final  Blood Culture (routine x 2)     Status: None (Preliminary result)   Collection Time: 04/09/16  5:55 PM  Result Value Ref Range Status   Specimen Description BLOOD RIGHT WRIST  Final   Special Requests BOTTLES DRAWN AEROBIC AND ANAEROBIC 5CC  Final   Culture   Final    NO GROWTH 3 DAYS Performed at [facility]    Report Status PENDING  Incomplete  MRSA PCR Screening     Status: None   Collection Time: 04/09/16 10:39 PM  Result Value Ref Range Status   MRSA by PCR NEGATIVE NEGATIVE Final    Comment:        The GeneXpert MRSA Assay (FDA approved for NASAL specimens only), is one component of a comprehensive MRSA colonization surveillance program. It is not intended to diagnose MRSA infection nor to guide or monitor treatment for MRSA infections.       Radiology Studies: No results found.   Medications:  Scheduled: . allopurinol  150 mg Oral Daily  . amLODipine  10 mg Oral Daily  . carvedilol  18.75 mg Oral BID WC  . cefpodoxime  200 mg Oral Q12H  . donepezil  10 mg Oral QHS  . hydrocortisone sod succinate (SOLU-CORTEF) inj  50 mg Intravenous Q8H  . insulin aspart  0-9 Units Subcutaneous TID WC  . levothyroxine  50 mcg Oral QAC breakfast  . LORazepam  0.5 mg Intravenous Once  . memantine  14  mg Oral Daily  . sodium chloride flush  3 mL Intravenous Q12H   Continuous: . dextrose 50 mL/hr at 04/11/16 1224   [id number] **OR** acetaminophen, hydrALAZINE, ondansetron **OR** ondansetron (ZOFRAN) IV  Assessment/Plan:  Active Problems:  DM (diabetes mellitus), type 2 with renal complications ([facility])   Essential hypertension   Coronary atherosclerosis   Peptic ulcer   Acute encephalopathy   Hypotension   Septic shock ([facility])   Myxedema coma ([facility])   Bradycardia   Acute hypoxemic respiratory failure ([place])   HCAP (healthcare-associated pneumonia)     Hypothermia Hypothermia has resolved. Thought to be secondary to sepsis versus myxedema coma. Cortisol level is greater than 100. However, it looks like this level was checked after the patient was given hydrocortisone, so may not be reliable. TSH was elevated at 7. Free T4 is 0.9. It is in the normal range. However, this level was checked, it appears, after the patient was given IV Synthroid, so level may not be reliable. Change to oral Synthroid today. Cutback on the IV hydrocortisone. Patient's family does not want aggressive measures. But okay to continue with current line of treatment.  Acute respiratory failure with hypoxia. Patient seems to have improved. Currently on oxygen only by nasal cannula. No infiltrates noted on chest x-ray.  Questionable Sepsis of unknown source There was some concern for sepsis due to her hypothermia. The cultures are negative so far. Vancomycin was stopped on 10/23. We will discontinue Zosyn today. Initiate Vantin.   Essential hypertension. Blood pressure was initially low in the emergency department. Now in the hypertensive range. Resume her home medications. Hydralazine as needed.  Acute on chronic kidney disease stage 2 Renal function continues to improve. Potassium was noted to be low, which will be repleted. Continue Foley catheter for now.   Hypernatremia Likely due to poor free  water intake. Patient was seen by his therapy. She is on a dysphagia 1 diet. Slow infusion of D5W was initiated yesterday. Sodium level has improved. Continue fluids for today and could discontinue tomorrow.  Acute encephalopathy in the setting of hypothermia with known history of advanced Alzheimer's dementia Patient's mental status has improved. However, she remains confused. CT scan of the head did not show any acute findings.  History of advanced Alzheimer's dementia. All of her home medications were held. She is noted to be on Depakote at home. Depakote level was 19. She is also on Aricept as well as Namenda. These will be reinitiated. Intended to hold Depakote.  History of coronary artery disease. Stable. Troponins are normal.  History of diabetes mellitus type 2 with renal complications Continue to monitor CBGs. No recent HbA1c is in our system. Sliding scale coverage.  Normocytic anemia Some drop in hemoglobin is due to dilution. No overt bleeding has been noted.  Thrombocytopenia. Low platelet counts are  likely due to sepsis. No evidence for overt bleeding. Continue to monitor. Avoid heparin products.  DVT Prophylaxis: SCDs    Code Status: DO NOT RESUSCITATE  Family Communication: Discussed with the patient's daughter  Disposition Plan: Continue management as outlined above. Palliative medicine to continue to follow. PT evaluation. Even though the patient has improved over the last 48 hours, patient may still benefit from being on hospice since her prognosis does appear to be poor over long-term.    LOS: 3 days   [facility] Pager [phone] 04/12/2016, 11:49 AM  If 7PM-7AM, please contact night-coverage at [email or web address], password [facility]

## 2016-04-12 NOTE — Progress Notes (Signed)
Speech Language Pathology Treatment: Dysphagia  Patient Details Name: Emily [name] MRN: [id number] DOB: 1930-11-14 Today's Date: 04/12/2016 Time: [id number] SLP Time Calculation (min) (ACUTE ONLY): 10 min  Assessment / Plan / Recommendation Clinical Impression  F/u after yesterday's swallow assessment.  Dtr and POA present today - pt sleepy, but consumed large, successive boluses of water from a straw.  Requires initial max cues to anticipate approach of straw and seal lips, but then continues activity without difficulty.  There were no overt s/s of aspiration.  Discussed recs for diet/precautions with her daughter.  Will follow briefly for safety/diet progression, if applicable.     HPI HPI: 82 year old African-American female with a past medical history of diabetes, advanced dementia, coronary artery disease, congestive heart failure of unknown type, hypertension and chronic kidney disease, who lives in a skilled nursing facility and was brought in due to increased lethargy for the past 1-2 days. Patient was found to be hypothermic. There was concern for sepsis. She had elevated TSH levels and there was concern for myxedema,. She was hospitalized for further management. Palliative Medicine consulted.  Per daughter's report, pt requires total assistance with feeding, but has not demonstrated difficulty with eating in the past       SLP Plan  Continue with current plan of care     Recommendations  Diet recommendations: Dysphagia 1 (puree);Thin liquid Liquids provided via: Cup;Straw Medication Administration: Whole meds with puree Supervision: Full supervision/cueing for compensatory strategies;Trained caregiver to feed patient Compensations: Minimize environmental distractions;Slow rate;Small sips/bites                Oral Care Recommendations: Oral care BID Follow up Recommendations: None Plan: Continue with current plan of care       [place]. Tivis Ringer, [place]  CCC/SLP Pager [phone]   Emily [name] 04/12/2016, 2:02 PM

## 2016-04-13 DIAGNOSIS — E035 Myxedema coma: Secondary | ICD-10-CM

## 2016-04-13 DIAGNOSIS — G9340 Encephalopathy, unspecified: Secondary | ICD-10-CM

## 2016-04-13 DIAGNOSIS — I10 Essential (primary) hypertension: Secondary | ICD-10-CM

## 2016-04-13 DIAGNOSIS — A419 Sepsis, unspecified organism: Principal | ICD-10-CM

## 2016-04-13 LAB — CBC
HCT: 29.6 % — ABNORMAL LOW (ref 36.0–46.0)
HEMOGLOBIN: 9.9 g/dL — AB (ref 12.0–15.0)
MCH: 28.4 pg (ref 26.0–34.0)
MCHC: 33.4 g/dL (ref 30.0–36.0)
MCV: 85.1 fL (ref 78.0–100.0)
Platelets: 96 10*3/uL — ABNORMAL LOW (ref 150–400)
RBC: 3.48 MIL/uL — AB (ref 3.87–5.11)
RDW: 15.4 % (ref 11.5–15.5)
WBC: 4.9 10*3/uL (ref 4.0–10.5)

## 2016-04-13 LAB — BASIC METABOLIC PANEL
ANION GAP: 7 (ref 5–15)
BUN: 18 mg/dL (ref 6–20)
CHLORIDE: 109 mmol/L (ref 101–111)
CO2: 31 mmol/L (ref 22–32)
Calcium: 8.6 mg/dL — ABNORMAL LOW (ref 8.9–10.3)
Creatinine, Ser: 1.04 mg/dL — ABNORMAL HIGH (ref 0.44–1.00)
GFR calc Af Amer: 56 mL/min — ABNORMAL LOW (ref >60)
GFR calc non Af Amer: 48 mL/min — ABNORMAL LOW (ref >60)
Glucose, Bld: 153 mg/dL — ABNORMAL HIGH (ref 65–99)
POTASSIUM: 2.9 mmol/L — AB (ref 3.5–5.1)
SODIUM: 147 mmol/L — AB (ref 135–145)

## 2016-04-13 LAB — GLUCOSE, CAPILLARY
GLUCOSE-CAPILLARY: 154 mg/dL — AB (ref 65–99)
GLUCOSE-CAPILLARY: 145 mg/dL — AB (ref 65–99)
GLUCOSE-CAPILLARY: 110 mg/dL — AB (ref 65–99)
Glucose-Capillary: 171 mg/dL — ABNORMAL HIGH (ref 65–99)
Glucose-Capillary: 221 mg/dL — ABNORMAL HIGH (ref 65–99)

## 2016-04-13 MED ORDER — POTASSIUM CHLORIDE CRYS ER 20 MEQ PO TBCR
40.0000 meq | EXTENDED_RELEASE_TABLET | Freq: Once | ORAL | Status: AC
  Administered 2016-04-13: 40 meq via ORAL
  Filled 2016-04-13: qty 2

## 2016-04-13 MED ORDER — HYDROCORTISONE NA SUCCINATE PF 100 MG IJ SOLR
50.0000 mg | Freq: Two times a day (BID) | INTRAMUSCULAR | Status: DC
  Administered 2016-04-14: 50 mg via INTRAVENOUS
  Filled 2016-04-13 (×2): qty 1

## 2016-04-13 NOTE — Care Management Important Message (Signed)
Important Message  Patient Details  Name: Emily [name] MRN: [id number] Date of Birth: 01-26-1931   Medicare Important Message Given:  Yes    Camillo Flaming 04/13/2016, 10:26 AMImportant Message  Patient Details  Name: Emily [name] MRN: [id number] Date of Birth: 14-Aug-1930   Medicare Important Message Given:  Yes    Camillo Flaming 04/13/2016, 10:23 AM

## 2016-04-13 NOTE — NC FL2 (Signed)
[employer] LEVEL OF CARE SCREENING TOOL     IDENTIFICATION  Patient Name: Emily [name] Birthdate: December 02, 1930 Sex: female Admission Date (Current Location): 04/09/2016  [facility] and [place] Number:  Herbalist and Address:  [facility],  [place] [address] [place]      Provider Number: [id number]  Attending Physician Name and Address:  [name], MD  Relative Name and Phone Number:       Current Level of Care: Hospital Recommended Level of Care: [place] Prior Approval Number:    Date Approved/Denied:   PASRR Number:   [id number]  Discharge Plan: SNF    Current Diagnoses: Patient Active Problem List   Diagnosis Date Noted  . Goals of care, counseling/discussion   . Palliative care encounter   . Bradycardia   . Acute hypoxemic respiratory failure ([place])   . HCAP (healthcare-associated pneumonia)   . Acute encephalopathy 04/09/2016  . Hypotension 04/09/2016  . Septic shock ([place]) 04/09/2016  . Myxedema coma ([place]) 04/09/2016  . Acute GI bleeding 03/04/2013  . MICROCYTOSIS 07/29/2009  . DIVERTICULOSIS, COLON 09/14/2007  . DUODENITIS, WITHOUT HEMORRHAGE 08/17/2007  . CHEST PAIN 08/15/2007  . ADENOMATOUS COLONIC POLYP 05/31/2007  . Coronary atherosclerosis 05/31/2007  . HYPERLIPIDEMIA 08/09/2006  . DM (diabetes mellitus), type 2 with renal complications ([place]) [id number]  . HYPERCHOLESTEROLEMIA 06/29/2006  . OBESITY 06/29/2006  . Essential hypertension 06/29/2006  . Peptic ulcer 06/29/2006  . PSORIASIS 06/29/2006  . DEGENERATIVE JOINT DISEASE 06/29/2006  . SYMPTOM, MEMORY LOSS 06/29/2006  . WEIGHT LOSS 06/29/2006  . DEPRESSION, HX OF 06/29/2006  . HIP REPLACEMENT, TOTAL, HX OF 06/29/2006  . HYSTERECTOMY, HX OF 06/29/2006  . PERCUTANEOUS TRANSLUMINAL CORONARY ANGIOPLASTY, HX OF 06/29/2006  . CARPAL TUNNEL RELEASE, HX OF 06/29/2006    Orientation RESPIRATION BLADDER Height & Weight     Self  Normal  Indwelling catheter Weight: 214 lb 15.2 oz (97.5 kg) Height:  5\' 8"  (172.7 cm)  BEHAVIORAL SYMPTOMS/MOOD NEUROLOGICAL BOWEL NUTRITION STATUS      Incontinent Diet (Dysphagia 1)  AMBULATORY STATUS COMMUNICATION OF NEEDS Skin   Total Care Verbally Normal, PU Stage and Appropriate Care   PU Stage 2 Dressing:  (PRN)                   Personal Care Assistance Level of Assistance  Bathing, Feeding, Dressing Bathing Assistance: Limited assistance Feeding assistance: Limited assistance Dressing Assistance: Limited assistance     Functional Limitations Info  Sight, Hearing, Speech Sight Info: Adequate Hearing Info: Adequate Speech Info: Adequate    SPECIAL CARE FACTORS FREQUENCY  PT (By licensed PT), Speech therapy            Speech Therapy Frequency: 5      Contractures Contractures Info: Not present    Additional Factors Info  Code Status, Insulin Sliding Scale, Allergies Code Status Info: DNR Allergies Info: None   Insulin Sliding Scale Info: insulin aspart (novoLOG) injection 0-9 Units       Current Medications (04/13/2016):  This is the current hospital active medication list Current Facility-Administered Medications  Medication Dose Route Frequency Provider Last Rate Last Dose  . acetaminophen (TYLENOL) tablet 650 mg  650 mg Oral Q6H PRN Toy Baker, MD       Or  . acetaminophen (TYLENOL) suppository 650 mg  650 mg Rectal Q6H PRN Toy Baker, MD      . allopurinol (ZYLOPRIM) tablet 150 mg  150 mg Oral Daily Gokul  [place] Pink, MD   150 mg at 04/13/16 0943  . amLODipine (NORVASC) tablet 10 mg  10 mg Oral Daily Bonnielee Haff, MD   10 mg at 04/13/16 0943  . carvedilol (COREG) tablet 18.75 mg  18.75 mg Oral BID WC Bonnielee Haff, MD   18.75 mg at 04/13/16 0943  . cefpodoxime (VANTIN) tablet 200 mg  200 mg Oral Q12H Bonnielee Haff, MD   200 mg at 04/13/16 0943  . dextrose 5 % solution   Intravenous Continuous Bonnielee Haff, MD 50 mL/hr at 04/12/16 1642     . donepezil (ARICEPT) tablet 10 mg  10 mg Oral QHS Bonnielee Haff, MD   10 mg at 04/12/16 2145  . hydrALAZINE (APRESOLINE) injection 10 mg  10 mg Intravenous Q6H PRN Bonnielee Haff, MD   10 mg at 04/13/16 0502  . hydrocortisone sodium succinate (SOLU-CORTEF) 100 MG injection 50 mg  50 mg Intravenous Q8H Bonnielee Haff, MD   50 mg at 04/13/16 0502  . insulin aspart (novoLOG) injection 0-9 Units  0-9 Units Subcutaneous TID WC Bonnielee Haff, MD   1 Units at 04/13/16 1328  . levothyroxine (SYNTHROID, LEVOTHROID) tablet 50 mcg  50 mcg Oral QAC breakfast Bonnielee Haff, MD   50 mcg at 04/13/16 0943  . LORazepam (ATIVAN) injection 0.5 mg  0.5 mg Intravenous Once Jani Gravel, MD      . LORazepam (ATIVAN) injection 1 mg  1 mg Intravenous Once Jeryl Columbia, NP      . memantine (NAMENDA XR) 24 hr capsule 14 mg  14 mg Oral Daily Bonnielee Haff, MD   14 mg at 04/13/16 0943  . ondansetron (ZOFRAN) tablet 4 mg  4 mg Oral Q6H PRN Toy Baker, MD       Or  . ondansetron (ZOFRAN) injection 4 mg  4 mg Intravenous Q6H PRN Toy Baker, MD      . sodium chloride flush (NS) 0.9 % injection 3 mL  3 mL Intravenous Q12H Toy Baker, MD   3 mL at 04/12/16 2144     Discharge Medications: Please see discharge summary for a list of discharge medications.  Relevant Imaging Results:  Relevant Lab Results:   Additional Information ss#[phone]  Lia Hopping, LCSW

## 2016-04-13 NOTE — Progress Notes (Signed)
OT Cancellation Note  Patient Details Name: Emily [name] MRN: [id number] DOB: 1931/06/11   Cancelled Treatment:    Reason Eval/Treat Not Completed: OT screened, no needs identified, will sign off -- patient appears to be dependent with ADLs at baseline and is w/c bound. No acute OT needs identified. Will sign off.  Falicity Sheets A 04/13/2016, 8:11 AM

## 2016-04-13 NOTE — Progress Notes (Signed)
PT Cancellation Note  Patient Details Name: Emily [name] MRN: [id number] DOB: 10/09/1930   Cancelled Treatment:    Reason Eval/Treat Not Completed: PT screened, no needs identified, will sign off (total care at facility per chart, pt unable to follow functional commands consistently for nursing staff)   [facility] 04/13/2016, 1:44 PM

## 2016-04-13 NOTE — Progress Notes (Signed)
Daily Progress Note   Patient Name: Emily [name]       Date: 04/13/2016 DOB: [birth year]  Age: 82 y.o. MRN#: [id number] Attending Physician: [name], MD Primary Care Physician: [name], MD Admit Date: 04/09/2016  Reason for Consultation/Follow-up: Establishing goals of care  82 year old African-American female with a past medical history of diabetes, advanced dementia, coronary artery disease, congestive heart failure of unknown type, hypertension and chronic kidney disease, who lives in a skilled nursing facility and was brought in due to increased lethargy for the past 1-2 days. Patient was found to be hypothermic. There was concern for sepsis. She had elevated TSH levels and there was concern for myxedema. She was hospitalized for further management.  Subjective: Patient is awake this afternoon and eating lunch with assistance of her granddaughter.  She remains confused. She is not really able to participate in long-term goals conversation but does answer questions appropriately about how she is feeling, family, etc.  Discussed with her daughter [name] via phone. Further conversation as below.    Length of Stay: 4  Current Medications: Scheduled Meds:  . allopurinol  150 mg Oral Daily  . amLODipine  10 mg Oral Daily  . carvedilol  18.75 mg Oral BID WC  . cefpodoxime  200 mg Oral Q12H  . donepezil  10 mg Oral QHS  . hydrocortisone sod succinate (SOLU-CORTEF) inj  50 mg Intravenous Q8H  . insulin aspart  0-9 Units Subcutaneous TID WC  . levothyroxine  50 mcg Oral QAC breakfast  . LORazepam  0.5 mg Intravenous Once  . LORazepam  1 mg Intravenous Once  . memantine  14 mg Oral Daily  . sodium chloride flush  3 mL Intravenous Q12H    Continuous Infusions: .  dextrose 50 mL/hr at 04/12/16 1642    PRN Meds: acetaminophen **OR** acetaminophen, hydrALAZINE, ondansetron **OR** ondansetron (ZOFRAN) IV  Physical Exam         Awake and responsive today S1 S2 Diminished bases, has a coarse cough Abdomen soft Trace edema  Vital Signs: BP (!) 178/78 Comment: reck  Pulse 62   Temp 99.1 F (37.3 C) (Oral)   Resp 18   Ht 5\' 8"  (1.727 m)   Wt 97.5 kg (214 lb 15.2 oz)   SpO2 97%   BMI 32.68 kg/m  SpO2:  SpO2: 97 % O2 Device: O2 Device: Not Delivered O2 Flow Rate: O2 Flow Rate (L/min): 4 L/min  Intake/output summary:   Intake/Output Summary (Last 24 hours) at 04/13/16 1351 Last data filed at 04/13/16 1053  Gross per 24 hour  Intake              460 ml  Output             2050 ml  Net            -1590 ml   LBM: Last BM Date: 04/12/16 Baseline Weight: Weight: 113.4 kg (250 lb) Most recent weight: Weight: 97.5 kg (214 lb 15.2 oz)       Palliative Assessment/Data:    Flowsheet Rows   Flowsheet Row Most Recent Value  Intake Tab  Referral Department  Hospitalist  Unit at Time of Referral  Med/Surg Unit  Palliative Care Primary Diagnosis  Other (Comment)  Palliative Care Type  Return patient Palliative Care  Reason for referral  Clarify Goals of Care  Date first seen by Palliative Care  04/10/16  Clinical Assessment  Palliative Performance Scale Score  20%  Pain Max last 24 hours  4  Pain Min Last 24 hours  3  Dyspnea Max Last 24 Hours  4  Dyspnea Min Last 24 hours  3  Nausea Max Last 24 Hours  3  Nausea Min Last 24 Hours  2  Anxiety Max Last 24 Hours  3  Psychosocial & Spiritual Assessment  Palliative Care Outcomes  Patient/Family meeting held?  Yes  Who was at the meeting?  daughter and 2 grand daughters present in the room.   Palliative Care Outcomes  Clarified goals of care      Patient Active Problem List   Diagnosis Date Noted  . Goals of care, counseling/discussion   . Palliative care encounter   . Bradycardia     . Acute hypoxemic respiratory failure ([place])   . HCAP (healthcare-associated pneumonia)   . Acute encephalopathy 04/09/2016  . Hypotension 04/09/2016  . Septic shock ([place]) 04/09/2016  . Myxedema coma ([place]) 04/09/2016  . Acute GI bleeding 03/04/2013  . MICROCYTOSIS 07/29/2009  . DIVERTICULOSIS, COLON 09/14/2007  . DUODENITIS, WITHOUT HEMORRHAGE 08/17/2007  . CHEST PAIN 08/15/2007  . ADENOMATOUS COLONIC POLYP 05/31/2007  . Coronary atherosclerosis 05/31/2007  . HYPERLIPIDEMIA 08/09/2006  . DM (diabetes mellitus), type 2 with renal complications ([place]) [id number]  . HYPERCHOLESTEROLEMIA 06/29/2006  . OBESITY 06/29/2006  . Essential hypertension 06/29/2006  . Peptic ulcer 06/29/2006  . PSORIASIS 06/29/2006  . DEGENERATIVE JOINT DISEASE 06/29/2006  . SYMPTOM, MEMORY LOSS 06/29/2006  . WEIGHT LOSS 06/29/2006  . DEPRESSION, HX OF 06/29/2006  . HIP REPLACEMENT, TOTAL, HX OF 06/29/2006  . HYSTERECTOMY, HX OF 06/29/2006  . PERCUTANEOUS TRANSLUMINAL CORONARY ANGIOPLASTY, HX OF 06/29/2006  . CARPAL TUNNEL RELEASE, HX OF 06/29/2006    Palliative Care Assessment & Plan   Patient Profile:    Assessment:  advanced alzheimer's dementia Acute on chronic stage II CKD Hypothermia, acute hypoxic respiratory failure: ? Myxedema versus sepsis.   Recommendations/Plan: - Patient continues to improve.  Continue current therapy.   - Discussed with daughter/HCPOA [name]) regarding possibility of hospice services on discharge.  Once she is medically stable, [name] would like to pursue placement back at ALF with hospice services if this can be arranged.  Social work has been working to facilitate this.    Code Status:    Code Status Orders  Start     Ordered   04/10/16 0226  Do not attempt resuscitation (DNR)  Continuous    Question Answer Comment  In the event of cardiac or respiratory ARREST Do not call a "code blue"   In the event of cardiac or respiratory ARREST Do not perform  Intubation, CPR, defibrillation or ACLS   In the event of cardiac or respiratory ARREST Use medication by any route, position, wound care, and other measures to relive pain and suffering. May use oxygen, suction and manual treatment of airway obstruction as needed for comfort.      04/10/16 0225    Code Status History    Date Active Date Inactive Code Status Order ID Comments User Context   04/09/2016 10:28 PM 04/10/2016  2:25 AM Partial Code [id number]  Toy Baker, MD Inpatient   03/03/2013 11:25 PM 03/05/2013  7:19 PM DNR [id number]  Othella Boyer, MD Inpatient   03/03/2013 11:10 PM 03/03/2013 11:25 PM Full Code [id number]  Othella Boyer, MD Inpatient    Advance Directive Documentation   Flowsheet Row Most Recent Value  Type of Advance Directive  Living will, Out of facility DNR (pink MOST or yellow form)  Pre-existing out of facility DNR order (yellow form or pink MOST form)  No data  "MOST" Form in Place?  No data       Prognosis:   < 6 months. While she has improved over the past 24 hours, she has been declining over the past several weeks to months in regard to her nutrition, cognition, and functional status.. With her multiple comorbid conditions, I would anticipate her overall prognosis to be less than 6 months if her disease follows its natural course and she will be well served by hospice support on discharge.  Discharge Planning:  Return to ALF with hospice  Care plan was discussed with  Patient and daughter, [name] via phone  Thank you for allowing the Palliative Medicine Team to assist in the care of this patient.   Time In: 1240 Time Out: 1320 Total Time 30 Prolonged Time Billed  no       Greater than 50%  of this time was spent counseling and coordinating care related to the above assessment and plan.  Micheline Rough, MD [phone]  Please contact Palliative Medicine Team phone at [phone] for questions and concerns.

## 2016-04-13 NOTE — Evaluation (Signed)
SLP Cancellation Note  Patient Details Name: Emily [name] MRN: [id number] DOB: [birth year]   Cancelled treatment:       Reason Eval/Treat Not Completed: Other (comment) (pt heard with congested cough while SLP outside of room, pt desires to sleep per her discussion with family, note palliative plan for ALF with hospice)   Luanna Salk, [facility] SLP [phone]

## 2016-04-13 NOTE — Progress Notes (Signed)
[facility] PROGRESS NOTE  Emily [name] [id number] DOB: 07-Mar-1931 DOA: 04/09/2016  PCP: [name], MD  Brief History/Interval Summary: 82 year old African-American female with a past medical history of diabetes, advanced dementia, coronary artery disease, congestive heart failure of unknown type, hypertension and chronic kidney disease, who lives in a skilled nursing facility and was brought in due to increased lethargy for the past 1-2 days. Patient was found to be hypothermic. There was concern for sepsis. She had elevated TSH levels and there was concern for myxedema coma. She was hospitalized for further management. Patient was given IV Synthroid, daily steroids and placed on broad-spectrum antibiotics. Her mental status has improved over the last 48 hours.  Reason for Visit: Hypothermia and altered mental status  Consultants: Critical care medicine has signed off  Procedures: None yet  Antibiotics: Vancomycin stopped on 10/23 Zosyn stopped on 10/24. Vantin started 10/4  Subjective/Interval History: Patient alert and awake. Has no new complaints reported.  ROS: Unable to obtain due to advanced dementia  Objective:  Vital Signs  Vitals:   04/12/16 1130 04/12/16 1300 04/13/16 0457 04/13/16 0652  BP: (!) 160/85 (!) 144/85 (!) 208/83 (!) 178/78  Pulse:  85 62   Resp:  18 18   Temp:  98 F (36.7 C) 99.1 F (37.3 C)   TempSrc:   Oral   SpO2:  96% 97%   Weight:      Height:        Intake/Output Summary (Last 24 hours) at 04/13/16 1446 Last data filed at 04/13/16 1053  Gross per 24 hour  Intake              460 ml  Output             2050 ml  Net            -1590 ml   Filed Weights   04/09/16 1730 04/09/16 2213 04/10/16 1703  Weight: 113.4 kg (250 lb) 93.7 kg (206 lb 9.1 oz) 97.5 kg (214 lb 15.2 oz)    General appearance: Awake and Alert. Does not appear to be in any distress. Resp: Diminished air entry at the bases. No wheezing, rales,  rhonchi. Equal chest rise. Cardio: regular rate and rhythm, S1, S2 normal, no murmur, click, rub or gallop GI: soft, non-tender; bowel sounds normal; no masses,  no organomegaly Extremities: Minimal edema noted bilateral lower extremities. Neurologic: Awake and responsive. Follow certain commands, but not others. No obvious neurological deficits noted.   Lab Results:  Data Reviewed: I have personally reviewed following labs and imaging studies  CBC:  Recent Labs Lab 04/09/16 1713 04/10/16 0351 04/11/16 0520 04/12/16 0928 04/13/16 0557  WBC 8.3 7.5 5.0 5.2 4.9  NEUTROABS 7.2  --   --   --   --   HGB 10.2* 9.5* 9.9* 9.7* 9.9*  HCT 30.6* 28.7* 29.6* 29.4* 29.6*  MCV 86.0 84.7 86.5 86.2 85.1  PLT 98* 101* 96* 92* 96*    Basic Metabolic Panel:  Recent Labs Lab 04/09/16 1713 04/10/16 0351 04/11/16 0520 04/12/16 0928 04/13/16 0557  NA 147* 148* 151* 146* 147*  K 3.7 3.8 3.5 3.3* 2.9*  CL 109 113* 115* 112* 109  CO2 29 30 29 27 31   GLUCOSE 84 69 90 196* 153*  BUN 22* 22* 19 20 18   CREATININE 1.70* 1.80* 1.51* 1.36* 1.04*  CALCIUM 9.1 8.4* 8.8* 8.7* 8.6*  MG  --  2.0  --   --   --  PHOS  --  5.0*  --   --   --     GFR: Estimated Creatinine Clearance: 50 mL/min (by C-G formula based on SCr of 1.04 mg/dL (H)).  Liver Function Tests:  Recent Labs Lab 04/09/16 1713 04/10/16 0351 04/11/16 0520  AST 18 14* 11*  ALT 13* 11* 9*  ALKPHOS 93 84 73  BILITOT 0.6 0.8 0.5  PROT 7.5 6.9 6.7  ALBUMIN 3.1* 2.9* 2.7*     Coagulation Profile:  Recent Labs Lab 04/09/16 2247  INR 1.30    Cardiac Enzymes:  Recent Labs Lab 04/09/16 2247 04/10/16 0351 04/10/16 1050  TROPONINI <0.03 <0.03 <0.03    CBG:  Recent Labs Lab 04/12/16 1149 04/12/16 1634 04/12/16 2129 04/13/16 0726 04/13/16 1146  GLUCAP 201* 101* 154* 171* 145*    Thyroid Function Tests: No results for input(s): TSH, T4TOTAL, FREET4, T3FREE, THYROIDAB in the last 72 hours.   Recent Results  (from the past 240 hour(s))  Blood Culture (routine x 2)     Status: None (Preliminary result)   Collection Time: 04/09/16  5:13 PM  Result Value Ref Range Status   Specimen Description BLOOD LEFT HAND  Final   Special Requests IN PEDIATRIC BOTTLE [place]  Final   Culture   Final    NO GROWTH 4 DAYS Performed at [facility]    Report Status PENDING  Incomplete  Urine culture     Status: None   Collection Time: 04/09/16  5:50 PM  Result Value Ref Range Status   Specimen Description URINE, CLEAN CATCH  Final   Special Requests NONE  Final   Culture NO GROWTH Performed at [facility]   Final   Report Status 04/10/2016 FINAL  Final  Blood Culture (routine x 2)     Status: None (Preliminary result)   Collection Time: 04/09/16  5:55 PM  Result Value Ref Range Status   Specimen Description BLOOD RIGHT WRIST  Final   Special Requests BOTTLES DRAWN AEROBIC AND ANAEROBIC 5CC  Final   Culture   Final    NO GROWTH 4 DAYS Performed at [facility]    Report Status PENDING  Incomplete  MRSA PCR Screening     Status: None   Collection Time: 04/09/16 10:39 PM  Result Value Ref Range Status   MRSA by PCR NEGATIVE NEGATIVE Final    Comment:        The GeneXpert MRSA Assay (FDA approved for NASAL specimens only), is one component of a comprehensive MRSA colonization surveillance program. It is not intended to diagnose MRSA infection nor to guide or monitor treatment for MRSA infections.       Radiology Studies: No results found.   Medications:  Scheduled: . allopurinol  150 mg Oral Daily  . amLODipine  10 mg Oral Daily  . carvedilol  18.75 mg Oral BID WC  . cefpodoxime  200 mg Oral Q12H  . donepezil  10 mg Oral QHS  . hydrocortisone sod succinate (SOLU-CORTEF) inj  50 mg Intravenous Q8H  . insulin aspart  0-9 Units Subcutaneous TID WC  . levothyroxine  50 mcg Oral QAC breakfast  . LORazepam  0.5 mg Intravenous Once  . LORazepam  1 mg Intravenous Once  .  memantine  14 mg Oral Daily  . sodium chloride flush  3 mL Intravenous Q12H   Continuous: . dextrose 50 mL/hr at 04/12/16 1642   [id number] **OR** acetaminophen, hydrALAZINE, ondansetron **OR** ondansetron (ZOFRAN) IV  Assessment/Plan:  Active Problems:   DM (diabetes mellitus), type 2 with renal complications (HCC)   Essential hypertension   Coronary atherosclerosis   Peptic ulcer   Acute encephalopathy   Hypotension   Septic shock (HCC)   Myxedema coma (H[facility])   Bradycardia   Acute hypoxemic respiratory failure ([facility])   HCAP (healthcare-associated pneumonia)   Goals of care, counseling/discussion   Palliative care encounter   Hypothermia Hypothermia has resolved. Thought to be secondary to sepsis versus myxedema coma. Cortisol level is greater than 100. However, it looks like this level was checked after the patient was given hydrocortisone, so may not be reliable. TSH was elevated at 7. Free T4 is 0.9. It is in the normal range. However, this level was checked, it appears, after the patient was given IV Synthroid, so level may not be reliable. Change to oral Synthroid today.  - Will continue to cutback on the IV hydrocortisone. Patient's family does not want aggressive measures. But okay to continue with current line of treatment.  Acute respiratory failure with hypoxia. Improving.  No infiltrates noted on chest x-ray.  Questionable Sepsis of unknown source There was some concern for sepsis due to her hypothermia. The cultures are negative so far.  - continue Vantin.   Essential hypertension. Blood pressure was initially low in the emergency department. Now in the hypertensive range.  - Resume her home medications. Hydralazine as needed.  Acute on chronic kidney disease stage 2 Renal function continues to improve.   Hypernatremia Likely due to poor free water intake. Patient was seen by his therapy. She is on a dysphagia 1 diet. Slow infusion of D5W was initiated  yesterday. Sodium level has improved. Continue fluids for today and could discontinue tomorrow.  Hypokalemia - Will replace orally  Acute encephalopathy in the setting of hypothermia with known history of advanced Alzheimer's dementia Patient's mental status has improved. However, she remains confused although patient has advanced dementia. CT scan of the head did not show any acute findings.  History of advanced Alzheimer's dementia. All of her home medications were held. She is noted to be on Depakote at home. Depakote level was 19. She is also on Aricept as well as Namenda. These will be reinitiated. Intended to hold Depakote.  History of coronary artery disease. Stable. Troponins are normal.  History of diabetes mellitus type 2 with renal complications Continue to monitor CBGs. No recent HbA1c is in our system. Sliding scale coverage.  Normocytic anemia Some drop in hemoglobin is due to dilution. No overt bleeding has been noted.  Thrombocytopenia. Low platelet counts are  likely due to sepsis. No evidence for overt bleeding. Continue to monitor. Avoid heparin products.  DVT Prophylaxis: SCDs    Code Status: DO NOT RESUSCITATE  Family Communication: Discussed with the patient's daughter  Disposition Plan: Continue management as outlined above. Palliative medicine to continue to follow. PT evaluation. Even though the patient has improved over the last 48 hours, patient may still benefit from being on hospice since her prognosis does appear to be poor over long-term.    LOS: 4 days   Akosua Constantine, [facility] Pager [phone] 04/13/2016, 2:46 PM  If 7PM-7AM, please contact night-coverage at [email or web address], password [facility]

## 2016-04-13 NOTE — Progress Notes (Signed)
ALF will not be take patient back. They cannot assist her with a Foley Catheter and she can no longer transfer from wheel chair without assistance. Patient daughter/POA [name] aware and understands pt. Will need a SNF facility. [facility] faxed patient clinical information to SNF's in [place]. LCSWA will follow up with bed offers in the a.m.

## 2016-04-13 NOTE — Progress Notes (Signed)
Emily [name] will come to re evaluate patient today before 5:00pm, patient has been away from facility for 72 hours, to determine if patient can be readmitted. This procedure is required for all of their residents.

## 2016-04-14 LAB — BASIC METABOLIC PANEL
Anion gap: 7 (ref 5–15)
BUN: 25 mg/dL — AB (ref 6–20)
CALCIUM: 8.5 mg/dL — AB (ref 8.9–10.3)
CO2: 27 mmol/L (ref 22–32)
CREATININE: 1.13 mg/dL — AB (ref 0.44–1.00)
Chloride: 107 mmol/L (ref 101–111)
GFR calc Af Amer: 51 mL/min — ABNORMAL LOW (ref >60)
GFR, EST NON AFRICAN AMERICAN: 44 mL/min — AB (ref >60)
GLUCOSE: 220 mg/dL — AB (ref 65–99)
Potassium: 3.6 mmol/L (ref 3.5–5.1)
Sodium: 141 mmol/L (ref 135–145)

## 2016-04-14 LAB — CULTURE, BLOOD (ROUTINE X 2)
CULTURE: NO GROWTH
CULTURE: NO GROWTH

## 2016-04-14 LAB — GLUCOSE, CAPILLARY
GLUCOSE-CAPILLARY: 100 mg/dL — AB (ref 65–99)
GLUCOSE-CAPILLARY: 240 mg/dL — AB (ref 65–99)
Glucose-Capillary: 223 mg/dL — ABNORMAL HIGH (ref 65–99)
Glucose-Capillary: 171 mg/dL — ABNORMAL HIGH (ref 65–99)

## 2016-04-14 MED ORDER — HYDROCORTISONE NA SUCCINATE PF 100 MG IJ SOLR
50.0000 mg | Freq: Every day | INTRAMUSCULAR | Status: DC
  Administered 2016-04-15: 50 mg via INTRAVENOUS
  Filled 2016-04-14: qty 1

## 2016-04-14 NOTE — Progress Notes (Signed)
PT Cancellation Note  Patient Details Name: Emily [name] MRN: [id number] DOB: 03/20/1931   Cancelled Treatment:    Reason Eval/Treat Not Completed: PT screened, no needs identified, will sign off as indicated in PT  note from 10/25. (patient requires total care, going to SNF with Hospice per RN)   Claretha Cooper 04/14/2016, 12:15 PM Tresa Endo PT [phone]

## 2016-04-14 NOTE — Progress Notes (Signed)
[facility] PROGRESS NOTE  Emily [name] [id number] DOB: 08-Sep-1930 DOA: 04/09/2016  PCP: [name], MD  Brief History/Interval Summary: 82 year old African-American female with a past medical history of diabetes, advanced dementia, coronary artery disease, congestive heart failure of unknown type, hypertension and chronic kidney disease, who lives in a skilled nursing facility and was brought in due to increased lethargy for the past 1-2 days. Patient was found to be hypothermic. There was concern for sepsis. She had elevated TSH levels and there was concern for myxedema coma. She was hospitalized for further management. Patient was given IV Synthroid, daily steroids and placed on broad-spectrum antibiotics. Her mental status has improved over the last 48 hours.  Reason for Visit: Hypothermia and altered mental status  Consultants: Critical care medicine has signed off  Procedures: None yet  Antibiotics: Vancomycin stopped on 10/23 Zosyn stopped on 10/24. Vantin started 10/4  Subjective/Interval History: No acute issues reported overnight.  ROS: Unable to obtain due to advanced dementia  Objective:  Vital Signs  Vitals:   04/13/16 2100 04/14/16 0630 04/14/16 1029 04/14/16 1030  BP: (!) 153/100 (!) 171/103 (!) 152/77 (!) 152/77  Pulse: 76 74 82   Resp: 17 16    Temp: 98.5 F (36.9 C) 98.8 F (37.1 C)    TempSrc: Axillary Axillary    SpO2: 99% 100% 95%   Weight:      Height:        Intake/Output Summary (Last 24 hours) at 04/14/16 1445 Last data filed at 04/14/16 0657  Gross per 24 hour  Intake           930.83 ml  Output              900 ml  Net            30.83 ml   Filed Weights   04/09/16 1730 04/09/16 2213 04/10/16 1703  Weight: 113.4 kg (250 lb) 93.7 kg (206 lb 9.1 oz) 97.5 kg (214 lb 15.2 oz)    General appearance: Awake and Alert. Does not appear to be in any distress. Resp: Diminished air entry at the bases. No wheezing, rales,  rhonchi. Equal chest rise. Cardio: regular rate and rhythm, S1, S2 normal, no murmur, click, rub or gallop GI: soft, non-tender; bowel sounds normal; no masses,  no organomegaly Extremities: Minimal edema noted bilateral lower extremities. Neurologic: Awake and responsive. Follow certain commands, but not others. No obvious neurological deficits noted.   Lab Results:  Data Reviewed: I have personally reviewed following labs and imaging studies  CBC:  Recent Labs Lab 04/09/16 1713 04/10/16 0351 04/11/16 0520 04/12/16 0928 04/13/16 0557  WBC 8.3 7.5 5.0 5.2 4.9  NEUTROABS 7.2  --   --   --   --   HGB 10.2* 9.5* 9.9* 9.7* 9.9*  HCT 30.6* 28.7* 29.6* 29.4* 29.6*  MCV 86.0 84.7 86.5 86.2 85.1  PLT 98* 101* 96* 92* 96*    Basic Metabolic Panel:  Recent Labs Lab 04/09/16 1713 04/10/16 0351 04/11/16 0520 04/12/16 0928 04/13/16 0557  NA 147* 148* 151* 146* 147*  K 3.7 3.8 3.5 3.3* 2.9*  CL 109 113* 115* 112* 109  CO2 29 30 29 27 31   GLUCOSE 84 69 90 196* 153*  BUN 22* 22* 19 20 18   CREATININE 1.70* 1.80* 1.51* 1.36* 1.04*  CALCIUM 9.1 8.4* 8.8* 8.7* 8.6*  MG  --  2.0  --   --   --   PHOS  --  5.0*  --   --   --     GFR: Estimated Creatinine Clearance: 50 mL/min (by C-G formula based on SCr of 1.04 mg/dL (H)).  Liver Function Tests:  Recent Labs Lab 04/09/16 1713 04/10/16 0351 04/11/16 0520  AST 18 14* 11*  ALT 13* 11* 9*  ALKPHOS 93 84 73  BILITOT 0.6 0.8 0.5  PROT 7.5 6.9 6.7  ALBUMIN 3.1* 2.9* 2.7*     Coagulation Profile:  Recent Labs Lab 04/09/16 2247  INR 1.30    Cardiac Enzymes:  Recent Labs Lab 04/09/16 2247 04/10/16 0351 04/10/16 1050  TROPONINI <0.03 <0.03 <0.03    CBG:  Recent Labs Lab 04/13/16 1146 04/13/16 1625 04/13/16 2142 04/14/16 0725 04/14/16 1134  GLUCAP 145* 110* 221* 100* 240*    Thyroid Function Tests: No results for input(s): TSH, T4TOTAL, FREET4, T3FREE, THYROIDAB in the last 72 hours.   Recent Results  (from the past 240 hour(s))  Blood Culture (routine x 2)     Status: None (Preliminary result)   Collection Time: 04/09/16  5:13 PM  Result Value Ref Range Status   Specimen Description BLOOD LEFT HAND  Final   Special Requests IN PEDIATRIC BOTTLE [place]  Final   Culture   Final    NO GROWTH 4 DAYS Performed at [facility]    Report Status PENDING  Incomplete  Urine culture     Status: None   Collection Time: 04/09/16  5:50 PM  Result Value Ref Range Status   Specimen Description URINE, CLEAN CATCH  Final   Special Requests NONE  Final   Culture NO GROWTH Performed at [facility]   Final   Report Status 04/10/2016 FINAL  Final  Blood Culture (routine x 2)     Status: None (Preliminary result)   Collection Time: 04/09/16  5:55 PM  Result Value Ref Range Status   Specimen Description BLOOD RIGHT WRIST  Final   Special Requests BOTTLES DRAWN AEROBIC AND ANAEROBIC 5CC  Final   Culture   Final    NO GROWTH 4 DAYS Performed at [facility]    Report Status PENDING  Incomplete  MRSA PCR Screening     Status: None   Collection Time: 04/09/16 10:39 PM  Result Value Ref Range Status   MRSA by PCR NEGATIVE NEGATIVE Final    Comment:        The GeneXpert MRSA Assay (FDA approved for NASAL specimens only), is one component of a comprehensive MRSA colonization surveillance program. It is not intended to diagnose MRSA infection nor to guide or monitor treatment for MRSA infections.       Radiology Studies: No results found.   Medications:  Scheduled: . allopurinol  150 mg Oral Daily  . amLODipine  10 mg Oral Daily  . carvedilol  18.75 mg Oral BID WC  . cefpodoxime  200 mg Oral Q12H  . donepezil  10 mg Oral QHS  . hydrocortisone sod succinate (SOLU-CORTEF) inj  50 mg Intravenous Q12H  . insulin aspart  0-9 Units Subcutaneous TID WC  . levothyroxine  50 mcg Oral QAC breakfast  . LORazepam  0.5 mg Intravenous Once  . LORazepam  1 mg Intravenous Once    . memantine  14 mg Oral Daily  . sodium chloride flush  3 mL Intravenous Q12H   Continuous: . dextrose 50 mL/hr at 04/13/16 1825   [id number] **OR** acetaminophen, hydrALAZINE, ondansetron **OR** ondansetron (ZOFRAN) IV  Assessment/Plan:  Active Problems:  DM (diabetes mellitus), type 2 with renal complications (HCC)   Essential hypertension   Coronary atherosclerosis   Peptic ulcer   Acute encephalopathy   Hypotension   Septic shock (HCC)   Myxedema coma (HCC)   Bradycardia   Acute hypoxemic respiratory failure (HCC)   HCAP (healthcare-associated pneumonia)   Goals of care, counseling/discussion   Palliative care encounter   Hypothermia Hypothermia has resolved. Thought to be secondary to sepsis versus myxedema coma. Cortisol level is greater than 100. However, it looks like this level was checked after the patient was given hydrocortisone, so may not be reliable. TSH was elevated at 7. Free T4 is 0.9. It is in the normal range. However, this level was checked, it appears, after the patient was given IV Synthroid, so level may not be reliable. Change to oral Synthroid today.  - Will continue to cutback some more on the IV hydrocortisone. Patient's family does not want aggressive measures. But okay to continue with current line of treatment.  Acute respiratory failure with hypoxia. Improving.  No infiltrates noted on chest x-ray.  Questionable Sepsis of unknown source There was some concern for sepsis due to her hypothermia. The cultures are negative so far.  - continue Vantin.   Essential hypertension. Blood pressure was initially low in the emergency department. Now in the hypertensive range.  - Resume her home medications. Hydralazine as needed.  Acute on chronic kidney disease stage 2 Renal function continues to improve.   Hypernatremia Likely due to poor free water intake. Patient was seen by his therapy. She is on a dysphagia 1 diet. Slow infusion of D5W  was initiated yesterday. Sodium level has improved. Continue fluids for today and could discontinue tomorrow.  Hypokalemia - Will replace orally - obtain bmp  Acute encephalopathy in the setting of hypothermia with known history of advanced Alzheimer's dementia Patient's mental status has improved. However, she remains confused although patient has advanced dementia. CT scan of the head did not show any acute findings.  History of advanced Alzheimer's dementia. All of her home medications were held. She is noted to be on Depakote at home. Depakote level was 19. She is also on Aricept as well as Namenda. These will be reinitiated. Intended to hold Depakote.  History of coronary artery disease. Stable. Troponins are normal.  History of diabetes mellitus type 2 with renal complications Continue to monitor CBGs. No recent HbA1c is in our system. Sliding scale coverage.  Normocytic anemia Some drop in hemoglobin is due to dilution. No overt bleeding has been noted.  Thrombocytopenia. Low platelet counts are  likely due to sepsis. No evidence for overt bleeding. Continue to monitor. Avoid heparin products.  DVT Prophylaxis: SCDs    Code Status: DO NOT RESUSCITATE  Family Communication: Discussed with the patient's daughter  Disposition Plan: Continue management as outlined above. Palliative medicine to continue to follow. PT evaluation. Even though the patient has improved over the last 48 hours, patient may still benefit from being on hospice since her prognosis does appear to be poor over long-term.    LOS: 5 days   [name]  [facility] Pager [phone] 04/14/2016, 2:45 PM  If 7PM-7AM, please contact night-coverage at [email or web address], password [facility]

## 2016-04-14 NOTE — Progress Notes (Signed)
Daily Progress Note   Patient Name: Emily [name]       Date: 04/14/2016 DOB: 02-17-1931  Age: 82 y.o. MRN#: [id number] Attending Physician: [name], MD Primary Care Physician: [name], MD Admit Date: 04/09/2016  Reason for Consultation/Follow-up: Establishing goals of care  82 year old African-American female with a past medical history of diabetes, advanced dementia, coronary artery disease, congestive heart failure of unknown type, hypertension and chronic kidney disease, who lives in a skilled nursing facility and was brought in due to increased lethargy for the past 1-2 days. Patient was found to be hypothermic. There was concern for sepsis. She had elevated TSH levels and there was concern for myxedema. She was hospitalized for further management.  Subjective: Patient is awake this morning.  Daughter is in room and patient is very conversational.  She remains confused. Still not able to participate in long-term goals conversation but does answer questions appropriately about how she is feeling, family, etc.  Only concern is that she would like more food when she gets her meals.  Discussed with her daughter [name] via phone yesterday.  Awaiting bed offers.  Length of Stay: 5  Current Medications: Scheduled Meds:  . allopurinol  150 mg Oral Daily  . amLODipine  10 mg Oral Daily  . carvedilol  18.75 mg Oral BID WC  . cefpodoxime  200 mg Oral Q12H  . donepezil  10 mg Oral QHS  . hydrocortisone sod succinate (SOLU-CORTEF) inj  50 mg Intravenous Q12H  . insulin aspart  0-9 Units Subcutaneous TID WC  . levothyroxine  50 mcg Oral QAC breakfast  . LORazepam  0.5 mg Intravenous Once  . LORazepam  1 mg Intravenous Once  . memantine  14 mg Oral Daily  . sodium chloride  flush  3 mL Intravenous Q12H    Continuous Infusions: . dextrose 50 mL/hr at 04/13/16 1825    PRN Meds: acetaminophen **OR** acetaminophen, hydrALAZINE, ondansetron **OR** ondansetron (ZOFRAN) IV  Physical Exam         Awake and responsive today S1 S2 Diminished bases, has a coarse cough Abdomen soft Trace edema  Vital Signs: BP (!) 152/77   Pulse 82   Temp 98.8 F (37.1 C) (Axillary)   Resp 16   Ht 5\' 8"  (1.727 m)   Wt 97.5 kg (214 lb  15.2 oz)   SpO2 95%   BMI 32.68 kg/m  SpO2: SpO2: 95 % O2 Device: O2 Device: Not Delivered O2 Flow Rate: O2 Flow Rate (L/min): 4 L/min  Intake/output summary:   Intake/Output Summary (Last 24 hours) at 04/14/16 1046 Last data filed at 04/14/16 [id number]  Gross per 24 hour  Intake          1170.83 ml  Output             1250 ml  Net           -79.17 ml   LBM: Last BM Date: 04/13/16 Baseline Weight: Weight: 113.4 kg (250 lb) Most recent weight: Weight: 97.5 kg (214 lb 15.2 oz)       Palliative Assessment/Data:    Flowsheet Rows   Flowsheet Row Most Recent Value  Intake Tab  Referral Department  Hospitalist  Unit at Time of Referral  Med/Surg Unit  Palliative Care Primary Diagnosis  Other (Comment)  Palliative Care Type  Return patient Palliative Care  Reason for referral  Clarify Goals of Care  Date first seen by Palliative Care  04/10/16  Clinical Assessment  Palliative Performance Scale Score  20%  Pain Max last 24 hours  4  Pain Min Last 24 hours  3  Dyspnea Max Last 24 Hours  4  Dyspnea Min Last 24 hours  3  Nausea Max Last 24 Hours  3  Nausea Min Last 24 Hours  2  Anxiety Max Last 24 Hours  3  Psychosocial & Spiritual Assessment  Palliative Care Outcomes  Patient/Family meeting held?  Yes  Who was at the meeting?  daughter and 2 grand daughters present in the room.   Palliative Care Outcomes  Clarified goals of care      Patient Active Problem List   Diagnosis Date Noted  . Goals of care, counseling/discussion    . Palliative care encounter   . Bradycardia   . Acute hypoxemic respiratory failure ([place])   . HCAP (healthcare-associated pneumonia)   . Acute encephalopathy 04/09/2016  . Hypotension 04/09/2016  . Septic shock ([place]) 04/09/2016  . Myxedema coma ([place]) 04/09/2016  . Acute GI bleeding 03/04/2013  . MICROCYTOSIS 07/29/2009  . DIVERTICULOSIS, COLON 09/14/2007  . DUODENITIS, WITHOUT HEMORRHAGE 08/17/2007  . CHEST PAIN 08/15/2007  . ADENOMATOUS COLONIC POLYP 05/31/2007  . Coronary atherosclerosis 05/31/2007  . HYPERLIPIDEMIA 08/09/2006  . DM (diabetes mellitus), type 2 with renal complications ([place]) [id number]  . HYPERCHOLESTEROLEMIA 06/29/2006  . OBESITY 06/29/2006  . Essential hypertension 06/29/2006  . Peptic ulcer 06/29/2006  . PSORIASIS 06/29/2006  . DEGENERATIVE JOINT DISEASE 06/29/2006  . SYMPTOM, MEMORY LOSS 06/29/2006  . WEIGHT LOSS 06/29/2006  . DEPRESSION, HX OF 06/29/2006  . HIP REPLACEMENT, TOTAL, HX OF 06/29/2006  . HYSTERECTOMY, HX OF 06/29/2006  . PERCUTANEOUS TRANSLUMINAL CORONARY ANGIOPLASTY, HX OF 06/29/2006  . CARPAL TUNNEL RELEASE, HX OF 06/29/2006    Palliative Care Assessment & Plan   Patient Profile:    Assessment:  advanced alzheimer's dementia Acute on chronic stage II CKD Hypothermia, acute hypoxic respiratory failure: ? Myxedema versus sepsis.   Recommendations/Plan: - Patient continues to improve.  Continue current therapy.   - Only concern per patient and family is that she is not getting enough food at her meals to be satisfied.  Wrote for double portions with meals. - Discussed with daughter/HCPOA [name]) yesterday regarding possibility of hospice services on discharge.  She believes that this would be beneficial.  Hope was to pursue  placement back at ALF with hospice services, but they are unable to take her back and meet her care needs.  Would recommend discharge to SNF with hospice following if this can be arranged.  Social work has been  working to facilitate this.   - Will no longer follow patient daily, as now largely deciding facility for disposition.  Please call if we can be of assistance at any point in the future in the care of [name].  Code Status:    Code Status Orders        Start     Ordered   04/10/16 0226  Do not attempt resuscitation (DNR)  Continuous    Question Answer Comment  In the event of cardiac or respiratory ARREST Do not call a "code blue"   In the event of cardiac or respiratory ARREST Do not perform Intubation, CPR, defibrillation or ACLS   In the event of cardiac or respiratory ARREST Use medication by any route, position, wound care, and other measures to relive pain and suffering. May use oxygen, suction and manual treatment of airway obstruction as needed for comfort.      04/10/16 0225    Code Status History    Date Active Date Inactive Code Status Order ID Comments User Context   04/09/2016 10:28 PM 04/10/2016  2:25 AM Partial Code [id number]  Toy Baker, MD Inpatient   03/03/2013 11:25 PM 03/05/2013  7:19 PM DNR [id number]  Othella Boyer, MD Inpatient   03/03/2013 11:10 PM 03/03/2013 11:25 PM Full Code [id number]  Othella Boyer, MD Inpatient    Advance Directive Documentation   Flowsheet Row Most Recent Value  Type of Advance Directive  Living will, Out of facility DNR (pink MOST or yellow form)  Pre-existing out of facility DNR order (yellow form or pink MOST form)  No data  "MOST" Form in Place?  No data       Prognosis:   < 6 months. While she has improved over the past few days, she has been declining over the past several weeks to months in regard to her nutrition, cognition, and functional status.. With her multiple comorbid conditions, I would anticipate her overall prognosis to be less than 6 months if her disease follows its natural course and she will be well served by hospice support on discharge.  Discharge Planning:  [place] with  Hospice  Care plan was discussed with  Patient and daughter  Thank you for allowing the Palliative Medicine Team to assist in the care of this patient.   Time In: 1015 Time Out: 1035 Total Time 20 Prolonged Time Billed  no       Greater than 50%  of this time was spent counseling and coordinating care related to the above assessment and plan.  Micheline Rough, MD [phone]  Please contact Palliative Medicine Team phone at [phone] for questions and concerns.

## 2016-04-15 LAB — GLUCOSE, CAPILLARY
GLUCOSE-CAPILLARY: 139 mg/dL — AB (ref 65–99)
GLUCOSE-CAPILLARY: 223 mg/dL — AB (ref 65–99)
Glucose-Capillary: 93 mg/dL (ref 65–99)
Glucose-Capillary: 209 mg/dL — ABNORMAL HIGH (ref 65–99)

## 2016-04-15 LAB — BASIC METABOLIC PANEL
Anion gap: 8 (ref 5–15)
BUN: 24 mg/dL — ABNORMAL HIGH (ref 6–20)
CHLORIDE: 106 mmol/L (ref 101–111)
CO2: 29 mmol/L (ref 22–32)
CREATININE: 1.13 mg/dL — AB (ref 0.44–1.00)
Calcium: 8.6 mg/dL — ABNORMAL LOW (ref 8.9–10.3)
GFR calc non Af Amer: 44 mL/min — ABNORMAL LOW (ref >60)
GFR, EST AFRICAN AMERICAN: 51 mL/min — AB (ref >60)
GLUCOSE: 96 mg/dL (ref 65–99)
Potassium: 3.2 mmol/L — ABNORMAL LOW (ref 3.5–5.1)
Sodium: 143 mmol/L (ref 135–145)

## 2016-04-15 MED ORDER — POTASSIUM CHLORIDE CRYS ER 20 MEQ PO TBCR
20.0000 meq | EXTENDED_RELEASE_TABLET | Freq: Once | ORAL | Status: AC
  Administered 2016-04-15: 20 meq via ORAL
  Filled 2016-04-15: qty 1

## 2016-04-15 MED ORDER — PREDNISONE 20 MG PO TABS
40.0000 mg | ORAL_TABLET | Freq: Every day | ORAL | Status: DC
  Administered 2016-04-16: 40 mg via ORAL
  Filled 2016-04-15: qty 2

## 2016-04-15 NOTE — Progress Notes (Signed)
Vega MD paged to contact POA carol with update about patient.

## 2016-04-15 NOTE — Progress Notes (Signed)
Emily [name] has agreed patient will d/c to [facility]. Patient can weekend d/c on Saturday Only. No D/C on Sunday to SNF. CSW will continue to follow and assist with discharge planning.

## 2016-04-15 NOTE — Progress Notes (Signed)
LCSWA discussed patient disposition with daughter. Family has chose [facility] as back up facility if [place] cannot accommodate once discharged.

## 2016-04-15 NOTE — Progress Notes (Signed)
[facility] PROGRESS NOTE  Emily [name] [id number] DOB: 1931/02/04 DOA: 04/09/2016  PCP: [name], MD  Brief History/Interval Summary: 82 year old African-American female with a past medical history of diabetes, advanced dementia, coronary artery disease, congestive heart failure of unknown type, hypertension and chronic kidney disease, who lives in a skilled nursing facility and was brought in due to increased lethargy for the past 1-2 days. Patient was found to be hypothermic. There was concern for sepsis. She had elevated TSH levels and there was concern for myxedema coma. She was hospitalized for further management. Patient was given IV Synthroid, daily steroids and placed on broad-spectrum antibiotics. Her mental status has improved over the last 48 hours.  Reason for Visit: Hypothermia and altered mental status  Consultants: Critical care medicine has signed off  Procedures: None yet  Antibiotics: Vancomycin stopped on 10/23 Zosyn stopped on 10/24. Vantin started 10/4  Subjective/Interval History: No acute issues reported overnight.  ROS: Unable to obtain due to advanced dementia  Objective:  Vital Signs  Vitals:   04/14/16 1507 04/14/16 2138 04/15/16 0500 04/15/16 1324  BP: (!) 151/80 (!) 144/73 (!) 154/73 130/64  Pulse: 80 73 68 68  Resp: 18 18 18 18   Temp: 98.5 F (36.9 C) 98.9 F (37.2 C) 98.9 F (37.2 C) 99.7 F (37.6 C)  TempSrc:  Oral Oral Oral  SpO2: 97% 100% 100% 100%  Weight:      Height:        Intake/Output Summary (Last 24 hours) at 04/15/16 1639 Last data filed at 04/15/16 1341  Gross per 24 hour  Intake                0 ml  Output             2200 ml  Net            -2200 ml   Filed Weights   04/09/16 1730 04/09/16 2213 04/10/16 1703  Weight: 113.4 kg (250 lb) 93.7 kg (206 lb 9.1 oz) 97.5 kg (214 lb 15.2 oz)    General appearance: Awake and Alert. Does not appear to be in any distress. Resp: Diminished air  entry at the bases. No wheezing, rales, rhonchi. Equal chest rise. Cardio: regular rate and rhythm, S1, S2 normal, no murmur, click, rub or gallop GI: soft, non-tender; bowel sounds normal; no masses,  no organomegaly Extremities: Minimal edema noted bilateral lower extremities. Neurologic: Awake and responsive. Follow certain commands, but not others. No obvious neurological deficits noted.   Lab Results:  Data Reviewed: I have personally reviewed following labs and imaging studies  CBC:  Recent Labs Lab 04/09/16 1713 04/10/16 0351 04/11/16 0520 04/12/16 0928 04/13/16 0557  WBC 8.3 7.5 5.0 5.2 4.9  NEUTROABS 7.2  --   --   --   --   HGB 10.2* 9.5* 9.9* 9.7* 9.9*  HCT 30.6* 28.7* 29.6* 29.4* 29.6*  MCV 86.0 84.7 86.5 86.2 85.1  PLT 98* 101* 96* 92* 96*    Basic Metabolic Panel:  Recent Labs Lab 04/10/16 0351 04/11/16 0520 04/12/16 0928 04/13/16 0557 04/14/16 1551 04/15/16 0548  NA 148* 151* 146* 147* 141 143  K 3.8 3.5 3.3* 2.9* 3.6 3.2*  CL 113* 115* 112* 109 107 106  CO2 30 29 27 31 27 29   GLUCOSE 69 90 196* 153* 220* 96  BUN 22* 19 20 18  25* 24*  CREATININE 1.80* 1.51* 1.36* 1.04* 1.13* 1.13*  CALCIUM 8.4* 8.8* 8.7* 8.6* 8.5* 8.6*  MG 2.0  --   --   --   --   --   PHOS 5.0*  --   --   --   --   --     GFR: Estimated Creatinine Clearance: 46 mL/min (by C-G formula based on SCr of 1.13 mg/dL (H)).  Liver Function Tests:  Recent Labs Lab 04/09/16 1713 04/10/16 0351 04/11/16 0520  AST 18 14* 11*  ALT 13* 11* 9*  ALKPHOS 93 84 73  BILITOT 0.6 0.8 0.5  PROT 7.5 6.9 6.7  ALBUMIN 3.1* 2.9* 2.7*     Coagulation Profile:  Recent Labs Lab 04/09/16 2247  INR 1.30    Cardiac Enzymes:  Recent Labs Lab 04/09/16 2247 04/10/16 0351 04/10/16 1050  TROPONINI <0.03 <0.03 <0.03    CBG:  Recent Labs Lab 04/14/16 1654 04/14/16 2128 04/15/16 0738 04/15/16 1147 04/15/16 1624  GLUCAP 223* 171* 93 139* 223*    Thyroid Function Tests: No  results for input(s): TSH, T4TOTAL, FREET4, T3FREE, THYROIDAB in the last 72 hours.   Recent Results (from the past 240 hour(s))  Blood Culture (routine x 2)     Status: None   Collection Time: 04/09/16  5:13 PM  Result Value Ref Range Status   Specimen Description BLOOD LEFT HAND  Final   Special Requests IN PEDIATRIC BOTTLE 2CC  Final   Culture   Final    NO GROWTH 5 DAYS Performed at [facility]    Report Status 04/14/2016 FINAL  Final  Urine culture     Status: None   Collection Time: 04/09/16  5:50 PM  Result Value Ref Range Status   Specimen Description URINE, CLEAN CATCH  Final   Special Requests NONE  Final   Culture NO GROWTH Performed at [facility]   Final   Report Status 04/10/2016 FINAL  Final  Blood Culture (routine x 2)     Status: None   Collection Time: 04/09/16  5:55 PM  Result Value Ref Range Status   Specimen Description BLOOD RIGHT WRIST  Final   Special Requests BOTTLES DRAWN AEROBIC AND ANAEROBIC 5CC  Final   Culture   Final    NO GROWTH 5 DAYS Performed at [facility]    Report Status 04/14/2016 FINAL  Final  MRSA PCR Screening     Status: None   Collection Time: 04/09/16 10:39 PM  Result Value Ref Range Status   MRSA by PCR NEGATIVE NEGATIVE Final    Comment:        The GeneXpert MRSA Assay (FDA approved for NASAL specimens only), is one component of a comprehensive MRSA colonization surveillance program. It is not intended to diagnose MRSA infection nor to guide or monitor treatment for MRSA infections.       Radiology Studies: No results found.   Medications:  Scheduled: . allopurinol  150 mg Oral Daily  . amLODipine  10 mg Oral Daily  . carvedilol  18.75 mg Oral BID WC  . cefpodoxime  200 mg Oral Q12H  . donepezil  10 mg Oral QHS  . hydrocortisone sod succinate (SOLU-CORTEF) inj  50 mg Intravenous Daily  . insulin aspart  0-9 Units Subcutaneous TID WC  . levothyroxine  50 mcg Oral QAC breakfast  .  LORazepam  0.5 mg Intravenous Once  . LORazepam  1 mg Intravenous Once  . memantine  14 mg Oral Daily  . potassium chloride  20 mEq Oral Once  . sodium chloride flush  3 mL Intravenous Q12H   Continuous: . dextrose 50 mL/hr at 04/15/16 0846   [id number] **OR** acetaminophen, hydrALAZINE, ondansetron **OR** ondansetron (ZOFRAN) IV  Assessment/Plan:  Active Problems:   DM (diabetes mellitus), type 2 with renal complications (HCC)   Essential hypertension   Coronary atherosclerosis   Peptic ulcer   Acute encephalopathy   Hypotension   Septic shock (HCC)   Myxedema coma (HCC)   Bradycardia   Acute hypoxemic respiratory failure ([place])   HCAP (healthcare-associated pneumonia)   Goals of care, counseling/discussion   Palliative care encounter   Hypothermia Hypothermia has resolved. Thought to be secondary to sepsis versus myxedema coma. tapering steroid regimen and will transition to oral regimen  Acute respiratory failure with hypoxia. resolved  Questionable Sepsis of unknown source There was some concern for sepsis due to her hypothermia. The cultures are negative so far.  - continue Vantin.   Essential hypertension. Blood pressure was initially low in the emergency department. Now in the hypertensive range.  - Resume her home medications. Hydralazine as needed.  Acute on chronic kidney disease stage 2 Renal function continues to improve.   Hypernatremia Likely due to poor free water intake.  - resolved  Hypokalemia - Will replace orally - obtain bmp  Acute encephalopathy in the setting of hypothermia with known history of advanced Alzheimer's dementia Patient's mental status has improved, near or at baseline.  History of advanced Alzheimer's dementia. All of her home medications were held. She is noted to be on Depakote at home. Depakote level was 19. She is also on Aricept as well as Namenda. These will be reinitiated. Intended to hold Depakote.  History  of coronary artery disease. Stable. Troponins are normal.  History of diabetes mellitus type 2 with renal complications Continue to monitor CBGs. No recent HbA1c is in our system. Sliding scale coverage.  Normocytic anemia Some drop in hemoglobin is due to dilution. No overt bleeding has been noted.  Thrombocytopenia. Low platelet counts are  likely due to sepsis. No evidence for overt bleeding. Continue to monitor. Avoid heparin products.  DVT Prophylaxis: SCDs    Code Status: DO NOT RESUSCITATE  Family Communication: Discussed with the patient's daughter  Disposition Plan: Most likely d/c next am.    LOS: 6 days   Coni Homesley, [facility] Pager [phone] 04/15/2016, 4:39 PM  If 7PM-7AM, please contact night-coverage at [email or web address], password [facility]

## 2016-04-16 DIAGNOSIS — R6521 Severe sepsis with septic shock: Secondary | ICD-10-CM

## 2016-04-16 LAB — CBC
HEMATOCRIT: 29.5 % — AB (ref 36.0–46.0)
HEMOGLOBIN: 9.7 g/dL — AB (ref 12.0–15.0)
MCH: 28 pg (ref 26.0–34.0)
MCHC: 32.9 g/dL (ref 30.0–36.0)
MCV: 85 fL (ref 78.0–100.0)
Platelets: 117 10*3/uL — ABNORMAL LOW (ref 150–400)
RBC: 3.47 MIL/uL — AB (ref 3.87–5.11)
RDW: 15.4 % (ref 11.5–15.5)
WBC: 9.5 10*3/uL (ref 4.0–10.5)

## 2016-04-16 LAB — GLUCOSE, CAPILLARY
GLUCOSE-CAPILLARY: 102 mg/dL — AB (ref 65–99)
Glucose-Capillary: 129 mg/dL — ABNORMAL HIGH (ref 65–99)

## 2016-04-16 LAB — BASIC METABOLIC PANEL
ANION GAP: 7 (ref 5–15)
BUN: 27 mg/dL — ABNORMAL HIGH (ref 6–20)
CHLORIDE: 108 mmol/L (ref 101–111)
CO2: 29 mmol/L (ref 22–32)
Calcium: 8.4 mg/dL — ABNORMAL LOW (ref 8.9–10.3)
Creatinine, Ser: 1.2 mg/dL — ABNORMAL HIGH (ref 0.44–1.00)
GFR calc non Af Amer: 41 mL/min — ABNORMAL LOW (ref >60)
GFR, EST AFRICAN AMERICAN: 47 mL/min — AB (ref >60)
GLUCOSE: 102 mg/dL — AB (ref 65–99)
POTASSIUM: 3.6 mmol/L (ref 3.5–5.1)
Sodium: 144 mmol/L (ref 135–145)

## 2016-04-16 MED ORDER — LEVOTHYROXINE SODIUM 50 MCG PO TABS
50.0000 ug | ORAL_TABLET | Freq: Every day | ORAL | 0 refills | Status: AC
Start: 2016-04-17 — End: ?

## 2016-04-16 MED ORDER — PREDNISONE 10 MG PO TABS: 10.0000 mg | ORAL_TABLET | ORAL | 0 refills | Status: DC

## 2016-04-16 NOTE — Clinical Social Work Placement (Signed)
Patient is set to discharge to [facility] SNF today. Patient & daughter/POA, [name] made aware. Discharge packet given to RN, Caryl Pina. [facility] called for transport to pickup at 4:30pm per Adela Lank at [facility] request.     Raynaldo Opitz, [place] Worker cell #: [phone]    CLINICAL SOCIAL WORK PLACEMENT  NOTE  Date:  04/16/2016  Patient Details  Name: Emily [name] MRN: [id number] Date of Birth: August 06, 1930  Clinical Social Work is seeking post-discharge placement for this patient at the [place] level of care (*CSW will initial, date and re-position this form in  chart as items are completed):  Yes   Patient/family provided with [place] Work Department's list of facilities offering this level of care within the geographic area requested by the patient (or if unable, by the patient's family).  Yes   Patient/family informed of their freedom to choose among providers that offer the needed level of care, that participate in Medicare, Medicaid or managed care program needed by the patient, have an available bed and are willing to accept the patient.  Yes   Patient/family informed of [facility]'s ownership interest in [facility] and [facility], as well as of the fact that they are under no obligation to receive care at these facilities.  PASRR submitted to EDS on 04/16/16     PASRR number received on 04/16/16     Existing PASRR number confirmed on       FL2 transmitted to all facilities in geographic area requested by pt/family on 04/16/16     FL2 transmitted to all facilities within larger geographic area on       Patient informed that his/her managed care company has contracts with or will negotiate with certain facilities, including the following:        Yes   Patient/family informed of bed offers received.  Patient chooses bed at [facility]     Physician recommends and  patient chooses bed at      Patient to be transferred to [facility] on 04/16/16.  Patient to be transferred to facility by [facility]     Patient family notified on 04/16/16 of transfer.  Name of family member notified:  patient's daughter/POA, [name] via phone     PHYSICIAN       Additional Comment:    _______________________________________________ Standley Brooking, LCSW 04/16/2016, 2:17 PM

## 2016-04-16 NOTE — Discharge Summary (Signed)
Physician Discharge Summary  Emily [name] [id number] DOB: 1931-03-30 DOA: 04/09/2016  PCP: [name], MD  Admit date: 04/09/2016 Discharge date: 04/16/2016  Time spent: > 35 minutes  Recommendations for Outpatient Follow-up:  1. Pt started on synthroid 2. Monitor blood pressures 3. Monitor hgb levels   Discharge Diagnoses:  Active Problems:   DM (diabetes mellitus), type 2 with renal complications (HCC)   Essential hypertension   Coronary atherosclerosis   Peptic ulcer   Acute encephalopathy   Hypotension   Septic shock (HCC)   Myxedema coma (HCC)   Bradycardia   Acute hypoxemic respiratory failure (HCC)   HCAP (healthcare-associated pneumonia)   Goals of care, counseling/discussion   Palliative care encounter   Discharge Condition: stable  Diet recommendation:   Filed Weights   04/09/16 1730 04/09/16 2213 04/10/16 1703  Weight: 113.4 kg (250 lb) 93.7 kg (206 lb 9.1 oz) 97.5 kg (214 lb 15.2 oz)    History of present illness:  82 year old African-American female with a past medical history of diabetes, advanced dementia, coronary artery disease, congestive heart failure of unknown type, hypertension and chronic kidney disease, who lives in a skilled nursing facility and was brought in due to increased lethargy for the past 1-2 days. Patient was found to be hypothermic. There was concern for sepsis. She had elevated TSH levels and there was concern for myxedema coma. She was hospitalized for further management. Patient was given IV Synthroid, daily steroids and placed on broad-spectrum antibiotics. Her mental status has improved over the last 48 hours.  Hospital Course:   Hypothermia Hypothermia has resolved. Thought to be secondary to sepsis versus myxedema coma. tapering steroid regimen on discharge  Acute respiratory failure with hypoxia. resolved  Questionable Sepsis of unknown source There was some concern for sepsis due to her hypothermia.  The cultures are negative so far.  - Patient has completed 7 days of antibiotic regimen.  Essential hypertension. - Resume her home medications.   Acute on chronic kidney disease stage 2 Renal function continues to improve.   Hypernatremia - resolved  Hypokalemia - resolved  Acute encephalopathy in the setting of hypothermia with known history of advanced Alzheimer's dementia Patient's mental status has improved, near or at baseline.  History of advanced Alzheimer's dementia. All of her home medications were held. She is noted to be on Depakote at home. Depakote level was 19. She is also on Aricept as well as Namenda. These will be reinitiated. Intended to hold Depakote.  History of coronary artery disease. Stable. Troponins are normal.  History of diabetes mellitus type 2 with renal complications Continue to monitor CBGs. No recent HbA1c is in our system. Sliding scale coverage.  Normocytic anemia Some drop in hemoglobin is due to dilution. No overt bleeding has been noted.  Thrombocytopenia. Low platelet counts are  likely due to sepsis. No evidence for overt bleeding. Continue to monitor. Avoid heparin products.   Procedures:  none  Consultations:  none  Discharge Exam: Vitals:   04/15/16 2154 04/16/16 0435  BP: 118/67 (!) 161/91  Pulse: 76 70  Resp: 18 18  Temp: 99 F (37.2 C) 97.8 F (36.6 C)    General: Pt in nad, alert and awake Cardiovascular: rrr, no rubs Respiratory: no increased wob, no wheezes  Discharge Instructions   Discharge Instructions    Call MD for:  severe uncontrolled pain    Complete by:  As directed    Call MD for:  temperature >100.4    Complete by:  As directed    Diet - low sodium heart healthy    Complete by:  As directed    Increase activity slowly    Complete by:  As directed      Current Discharge Medication List    START taking these medications   Details  levothyroxine (SYNTHROID, LEVOTHROID) 50 MCG  tablet Take 1 tablet (50 mcg total) by mouth daily before breakfast. Qty: 30 tablet, Refills: 0    predniSONE (DELTASONE) 10 MG tablet Take 1 tablet (10 mg total) by mouth as directed. Take 40 mg po daily for the next 2 days, then 30 mg po daily for the next 2 days, then 20 mg po daily for the next 2 days, then take 10 mg po daily for the next 2 days Qty: 30 tablet, Refills: 0      CONTINUE these medications which have NOT CHANGED   Details  !! acetaminophen (TYLENOL) 325 MG tablet Take 1,300 mg by mouth daily.    !! acetaminophen (TYLENOL) 500 MG tablet Take 500 mg by mouth every 4 (four) hours as needed for mild pain, fever or headache.    allopurinol (ZYLOPRIM) 300 MG tablet Take 150 mg by mouth daily.    alum & mag hydroxide-simeth (MINTOX) [id number] MG/5ML suspension Take 30 mLs by mouth every 6 (six) hours as needed for indigestion or heartburn.    amLODipine (NORVASC) 10 MG tablet Take 10 mg by mouth daily.     ammonium lactate (AMLACTIN) 12 % cream Apply 1 g topically 2 (two) times daily.    bag balm OINT ointment Apply 1 application topically 2 (two) times daily.    carvedilol (COREG) 12.5 MG tablet Take 18.75 mg by mouth 2 (two) times daily with a meal.     cetirizine (ZYRTEC) 5 MG tablet Take 5 mg by mouth daily.    cholecalciferol (VITAMIN D) 1000 UNITS tablet Take 1,000 Units by mouth daily.     divalproex (DEPAKOTE) 125 MG DR tablet Take 250 mg by mouth 3 (three) times daily.     donepezil (ARICEPT) 10 MG tablet Take 10 mg by mouth at bedtime.     furosemide (LASIX) 40 MG tablet Take 40 mg by mouth daily.     guaifenesin (ROBITUSSIN) 100 MG/5ML syrup Take 200 mg by mouth every 6 (six) hours as needed for cough.    loperamide (IMODIUM) 2 MG capsule Take 2 mg by mouth daily as needed for diarrhea or loose stools.    LORazepam (ATIVAN) 0.5 MG tablet Take 0.5 mg by mouth at bedtime.    losartan (COZAAR) 100 MG tablet Take 100 mg by mouth daily.     magnesium  hydroxide (MILK OF MAGNESIA) 400 MG/5ML suspension Take 30 mLs by mouth daily as needed for mild constipation.    memantine (NAMENDA XR) 14 MG CP24 24 hr capsule Take 14 mg by mouth daily.    Menthol, Topical Analgesic, (BIOFREEZE EX) Apply 1 application topically 2 (two) times daily.    mineral oil external liquid Place 2 drops into both ears 2 (two) times daily.    mirtazapine (REMERON) 15 MG tablet Take 15 mg by mouth at bedtime.    risperiDONE (RISPERDAL) 1 MG/ML oral solution Take 0.5 mg by mouth 2 (two) times daily. Mix in drink    traZODone (DESYREL) 150 MG tablet Take 150 mg by mouth at bedtime.    neomycin-bacitracin-polymyxin (NEOSPORIN) [phone] ointment Apply 1 application topically daily as needed (minor skin tears of abrasions.).     !! -  Potential duplicate medications found. Please discuss with provider.     No Known Allergies    The results of significant diagnostics from this hospitalization (including imaging, microbiology, ancillary and laboratory) are listed below for reference.    Significant Diagnostic Studies: Dg Chest 2 View  Result Date: 04/09/2016 CLINICAL DATA:  Altered mental status and fever. EXAM: CHEST  2 VIEW COMPARISON:  12/14/2015 FINDINGS: Unable to raise arms. Lungs are adequately inflated without definite airspace consolidation or effusion. There is moderate stable cardiomegaly. There is calcified plaque over the thoracic aorta. There are degenerative changes of the shoulders and spine. IMPRESSION: No acute cardiopulmonary disease. Stable cardiomegaly. Aortic atherosclerosis. Electronically Signed   By: Marin Olp M.D.   On: 04/09/2016 18:54   Ct Head Wo Contrast  Result Date: 04/09/2016 CLINICAL DATA:  Altered mental status. EXAM: CT HEAD WITHOUT CONTRAST TECHNIQUE: Contiguous axial images were obtained from the base of the skull through the vertex without intravenous contrast. COMPARISON:  02/19/2016 head CT. FINDINGS: Brain: Stable  densely calcified 2.6 cm meningioma in the right anterior frontal parafalcine convexity with stable local mass effect. No evidence of parenchymal hemorrhage or extra-axial fluid collection. Otherwise no mass lesion, mass effect, or midline shift. No CT evidence of acute infarction. Intracranial atherosclerosis. Generalized cerebral volume loss, most prominent in the bilateral temporal lobes. Nonspecific moderate subcortical and periventricular white matter hypodensity, most in keeping with chronic small vessel ischemic change. Cerebral ventricle sizes are stable and concordant with the degree of cerebral volume loss. Vascular: No hyperdense vessel or unexpected calcification. Skull: No evidence of calvarial fracture. Sinuses/Orbits: The visualized paranasal sinuses are essentially clear. Other:  The mastoid air cells are unopacified. IMPRESSION: 1.  No evidence of acute intracranial abnormality. 2. Stable right anterior frontal convexity meningioma. 3. Generalized cerebral volume loss, most prominent in the bilateral temporal lobes. Moderate chronic small vessel ischemia. Electronically Signed   By: Ilona Sorrel M.D.   On: 04/09/2016 18:51   Dg Chest Port 1 View  Result Date: 04/09/2016 CLINICAL DATA:  Per EMS pt from [facility] sent per request of grandson verbalizing patient is "different" since last seen and seems more lethargic. Pt norm is disoriented x4 with hx of dementia. Abnormal lung sounds noted with triage. Hx HTN, Diabetic, CHF. Non-smoker. EXAM: PORTABLE CHEST 1 VIEW COMPARISON:  04/09/2016 FINDINGS: Heart is enlarged. There is central perihilar opacity consistent with pulmonary edema. Small right effusion is noted. Degenerative changes are noted in the shoulders and thoracic spine. Study Quality is degraded by patient positioning. IMPRESSION: 1. Cardiomegaly and mild pulmonary edema. Electronically Signed   By: Nolon Nations M.D.   On: 04/09/2016 22:10    Microbiology: Recent Results  (from the past 240 hour(s))  Blood Culture (routine x 2)     Status: None   Collection Time: 04/09/16  5:13 PM  Result Value Ref Range Status   Specimen Description BLOOD LEFT HAND  Final   Special Requests IN PEDIATRIC BOTTLE [facility]  Final   Culture   Final    NO GROWTH 5 DAYS Performed at [facility]    Report Status 04/14/2016 FINAL  Final  Urine culture     Status: None   Collection Time: 04/09/16  5:50 PM  Result Value Ref Range Status   Specimen Description URINE, CLEAN CATCH  Final   Special Requests NONE  Final   Culture NO GROWTH Performed at [facility]   Final   Report Status 04/10/2016 FINAL  Final  Blood Culture (routine x 2)     Status: None   Collection Time: 04/09/16  5:55 PM  Result Value Ref Range Status   Specimen Description BLOOD RIGHT WRIST  Final   Special Requests BOTTLES DRAWN AEROBIC AND ANAEROBIC 5CC  Final   Culture   Final    NO GROWTH 5 DAYS Performed at [facility]    Report Status 04/14/2016 FINAL  Final  MRSA PCR Screening     Status: None   Collection Time: 04/09/16 10:39 PM  Result Value Ref Range Status   MRSA by PCR NEGATIVE NEGATIVE Final    Comment:        The GeneXpert MRSA Assay (FDA approved for NASAL specimens only), is one component of a comprehensive MRSA colonization surveillance program. It is not intended to diagnose MRSA infection nor to guide or monitor treatment for MRSA infections.      Labs: Basic Metabolic Panel:  Recent Labs Lab 04/10/16 0351  04/12/16 0928 04/13/16 0557 04/14/16 1551 04/15/16 0548 04/16/16 0545  NA 148*  < > 146* 147* 141 143 144  K 3.8  < > 3.3* 2.9* 3.6 3.2* 3.6  CL 113*  < > 112* 109 107 106 108  CO2 30  < > 27 31 27 29 29   GLUCOSE 69  < > 196* 153* 220* 96 102*  BUN 22*  < > 20 18 25* 24* 27*  CREATININE 1.80*  < > 1.36* 1.04* 1.13* 1.13* 1.20*  CALCIUM 8.4*  < > 8.7* 8.6* 8.5* 8.6* 8.4*  MG 2.0  --   --   --   --   --   --   PHOS 5.0*  --   --   --   --    --   --   < > = values in this interval not displayed. Liver Function Tests:  Recent Labs Lab 04/09/16 1713 04/10/16 0351 04/11/16 0520  AST 18 14* 11*  ALT 13* 11* 9*  ALKPHOS 93 84 73  BILITOT 0.6 0.8 0.5  PROT 7.5 6.9 6.7  ALBUMIN 3.1* 2.9* 2.7*   No results for input(s): LIPASE, AMYLASE in the last 168 hours. No results for input(s): AMMONIA in the last 168 hours. CBC:  Recent Labs Lab 04/09/16 1713 04/10/16 0351 04/11/16 0520 04/12/16 0928 04/13/16 0557 04/16/16 0545  WBC 8.3 7.5 5.0 5.2 4.9 9.5  NEUTROABS 7.2  --   --   --   --   --   HGB 10.2* 9.5* 9.9* 9.7* 9.9* 9.7*  HCT 30.6* 28.7* 29.6* 29.4* 29.6* 29.5*  MCV 86.0 84.7 86.5 86.2 85.1 85.0  PLT 98* 101* 96* 92* 96* 117*   Cardiac Enzymes:  Recent Labs Lab 04/09/16 2247 04/10/16 0351 04/10/16 1050  TROPONINI <0.03 <0.03 <0.03   BNP: BNP (last 3 results) No results for input(s): BNP in the last 8760 hours.  ProBNP (last 3 results) No results for input(s): PROBNP in the last 8760 hours.  CBG:  Recent Labs Lab 04/15/16 1147 04/15/16 1624 04/15/16 2149 04/16/16 0743 04/16/16 1141  GLUCAP 139* 223* 209* 102* 129*    Signed:  [name] MD.  [facility] 04/16/2016, 1:09 PM

## 2016-04-16 NOTE — Progress Notes (Signed)
Gave report to Murlean Caller at [facility].

## 2016-04-16 NOTE — Progress Notes (Signed)
Patient's sister and POA notified of discharge plan to [facility] and agrees.  IV removed.  Telemetry removed and CCMD notified.  Patient belongings gathered. Awaiting transport

## 2016-08-18 ENCOUNTER — Encounter: Payer: Self-pay | Admitting: Vascular Surgery

## 2016-08-22 ENCOUNTER — Other Ambulatory Visit: Payer: Self-pay | Admitting: Vascular Surgery

## 2016-08-22 DIAGNOSIS — L98499 Non-pressure chronic ulcer of skin of other sites with unspecified severity: Secondary | ICD-10-CM

## 2016-08-25 ENCOUNTER — Encounter: Payer: Self-pay | Admitting: Vascular Surgery

## 2016-08-25 ENCOUNTER — Ambulatory Visit (HOSPITAL_COMMUNITY)
Admission: RE | Admit: 2016-08-25 | Discharge: 2016-08-25 | Disposition: A | Discharge status: Home / Self Care | Payer: Medicare Other | Source: Ambulatory Visit | Attending: Vascular Surgery | Admitting: Vascular Surgery

## 2016-08-25 ENCOUNTER — Ambulatory Visit (INDEPENDENT_AMBULATORY_CARE_PROVIDER_SITE_OTHER): Payer: Medicare Other | Admitting: Vascular Surgery

## 2016-08-25 VITALS — BP 124/74 | HR 81 | Temp 98.7°F | Resp 18 | Ht 68.0 in | Wt 214.0 lb

## 2016-08-25 DIAGNOSIS — L97909 Non-pressure chronic ulcer of unspecified part of unspecified lower leg with unspecified severity: Secondary | ICD-10-CM

## 2016-08-25 DIAGNOSIS — I70299 Other atherosclerosis of native arteries of extremities, unspecified extremity: Secondary | ICD-10-CM

## 2016-08-25 DIAGNOSIS — L98499 Non-pressure chronic ulcer of skin of other sites with unspecified severity: Secondary | ICD-10-CM

## 2016-08-25 NOTE — Progress Notes (Signed)
Patient name: Emily [name] MRN: [id number] DOB: 18-Jun-1931 Sex: female  REASON FOR CONSULT: Diabetic foot ulcer.  HPI: Emily [name] is a 83 y.o. female, who is referred with extensive wounds of her left foot. She is a resident of [facility]. She has severe dementia. She is nonambulatory. I am unable to obtain any history from her. Her family is here and is helpful with a history. She does have a history of coronary artery disease and has undergone PTCA in the remote past. She reportedly does have pain in both feet.  Past Medical History:  Diagnosis Date  . Alzheimer disease   . CAD (coronary artery disease)    Moderate coronary artery disease status post angioplasty to the right coronary artery in 2003 and an ejection fraction 46% on catheterization in 2006.  . CKD (chronic kidney disease)   . Diabetes mellitus   . Duodenal erosion 2009   EGD by Dr. [name]  . Gastric erosion 2009   EGD by Dr. [name]  . Hypertension   . Osteoarthritis   . Perforated ulcer ([place]) 2002   Perforated pyloric channel ulcer patched by Dr. [name]  . Psoriasis   . Systolic CHF ([place])    EF as low is 45%, but 55% in 2009    Family History  Problem Relation Age of Onset  . Cancer Mother     SOCIAL HISTORY: Social History   Social History  . Marital status: Married    Spouse name: N/A  . Number of children: N/A  . Years of education: N/A   Occupational History  . Not on file.   Social History Main Topics  . Smoking status: Never Smoker  . Smokeless tobacco: Never Used  . Alcohol use No  . Drug use: No  . Sexual activity: Not on file   Other Topics Concern  . Not on file   Social History Narrative  . No narrative on file    No Known Allergies  Current Outpatient Prescriptions  Medication Sig Dispense Refill  . acetaminophen (TYLENOL) 325 MG tablet Take 1,300 mg by mouth daily.    Marland Kitchen acetaminophen (TYLENOL) 500 MG tablet Take 500 mg by mouth every 4 (four) hours as  needed for mild pain, fever or headache.    . allopurinol (ZYLOPRIM) 300 MG tablet Take 150 mg by mouth daily.    Marland Kitchen alum & mag hydroxide-simeth (MINTOX) [phone] MG/5ML suspension Take 30 mLs by mouth every 6 (six) hours as needed for indigestion or heartburn.    Marland Kitchen amLODipine (NORVASC) 10 MG tablet Take 10 mg by mouth daily.     Marland Kitchen ammonium lactate (AMLACTIN) 12 % cream Apply 1 g topically 2 (two) times daily.    . bag balm OINT ointment Apply 1 application topically 2 (two) times daily.    . carvedilol (COREG) 12.5 MG tablet Take 18.75 mg by mouth 2 (two) times daily with a meal.     . cetirizine (ZYRTEC) 5 MG tablet Take 5 mg by mouth daily.    . cholecalciferol (VITAMIN D) 1000 UNITS tablet Take 1,000 Units by mouth daily.     . divalproex (DEPAKOTE) 125 MG DR tablet Take 250 mg by mouth 3 (three) times daily.     Marland Kitchen donepezil (ARICEPT) 10 MG tablet Take 10 mg by mouth at bedtime.     . furosemide (LASIX) 40 MG tablet Take 40 mg by mouth daily.     Marland Kitchen guaifenesin (ROBITUSSIN) 100 MG/5ML  syrup Take 200 mg by mouth every 6 (six) hours as needed for cough.    . levothyroxine (SYNTHROID, LEVOTHROID) 50 MCG tablet Take 1 tablet (50 mcg total) by mouth daily before breakfast. 30 tablet 0  . loperamide (IMODIUM) 2 MG capsule Take 2 mg by mouth daily as needed for diarrhea or loose stools.    Marland Kitchen LORazepam (ATIVAN) 0.5 MG tablet Take 0.5 mg by mouth at bedtime.    Marland Kitchen losartan (COZAAR) 100 MG tablet Take 100 mg by mouth daily.     . magnesium hydroxide (MILK OF MAGNESIA) 400 MG/5ML suspension Take 30 mLs by mouth daily as needed for mild constipation.    . memantine (NAMENDA XR) 14 MG CP24 24 hr capsule Take 14 mg by mouth daily.    . Menthol, Topical Analgesic, (BIOFREEZE EX) Apply 1 application topically 2 (two) times daily.    . mineral oil external liquid Place 2 drops into both ears 2 (two) times daily.    . mirtazapine (REMERON) 15 MG tablet Take 15 mg by mouth at bedtime.    Marland Kitchen  neomycin-bacitracin-polymyxin (NEOSPORIN) [phone] ointment Apply 1 application topically daily as needed (minor skin tears of abrasions.).    Marland Kitchen predniSONE (DELTASONE) 10 MG tablet Take 1 tablet (10 mg total) by mouth as directed. Take 40 mg po daily for the next 2 days, then 30 mg po daily for the next 2 days, then 20 mg po daily for the next 2 days, then take 10 mg po daily for the next 2 days 30 tablet 0  . risperiDONE (RISPERDAL) 1 MG/ML oral solution Take 0.5 mg by mouth 2 (two) times daily. Mix in drink    . traZODone (DESYREL) 150 MG tablet Take 150 mg by mouth at bedtime.     No current facility-administered medications for this visit.     REVIEW OF SYSTEMS: Unable to obtain.   PHYSICAL EXAM: Vitals:   08/25/16 1409  BP: 124/74  Pulse: 81  Resp: 18  Temp: 98.7 F (37.1 C)  TempSrc: Oral  SpO2: 96%  Weight: 214 lb (97.1 kg)  Height: 5\' 8"  (1.727 m)    GENERAL: The patient is a well-nourished female, in no acute distress. The vital signs are documented above. CARDIAC: There is a regular rate and rhythm.  VASCULAR: I do not detect carotid bruits. She has palpable femoral pulses. I cannot palpate pedal pulses. She has mild bilateral lower extremity swelling. PULMONARY: There is good air exchange bilaterally without wheezing or rales. ABDOMEN: Soft and non-tender with normal pitched bowel sounds.  MUSCULOSKELETAL: There are no major deformities or cyanosis. NEUROLOGIC: No focal weakness or paresthesias are detected. SKIN: She has a full-thickness wound on her left heel which goes to the calcaneus. She also has a full-thickness wound on her medial malleolus which also appears to extend very close to the bone. There is no significant cellulitis or drainage noted. PSYCHIATRIC: The patient is responsive to voice.  DATA:   X-RAY LEFT CALCANEUS: This shows no evidence of bony pathology or fracture.  BILATERAL LOWER EXTREMITY ARTERIAL DOPPLER STUDY: I have independently  interpreted the bilateral lower extremity arterial Doppler study.  On the right side there is a monophasic dorsalis pedis signal with an ABI 53%. Toe pressure is 50 mmHg.  On the left side, which is the symptomatic site, there is a monophasic dorsalis pedis signal. ABI is 45%. Toe pressure is 0.  MEDICAL ISSUES:  NONSALVAGEABLE LEFT LOWER EXTREMITY: His is a elderly debilitated woman  with severe dementia who is nonambulatory who presents with extensive wounds on her left foot and severe infrainguinal arterial occlusive disease. She is clearly not a candidate for revascularization. The options would be palliative care versus a left above-the-knee amputation. I've discussed the risks of both approaches and the family will discuss this before making a decision. If they would like to proceed with AKA we can schedule this in the near future. Currently the wounds do not appear to be a source of sepsis as there is no significant cellulitis or drainage.   Deitra Mayo [facility] [place] [place] [phone]

## 2016-08-29 ENCOUNTER — Other Ambulatory Visit: Payer: Self-pay

## 2016-08-29 ENCOUNTER — Encounter: Payer: Self-pay | Admitting: Internal Medicine

## 2016-09-07 ENCOUNTER — Encounter (HOSPITAL_COMMUNITY): Payer: Self-pay | Admitting: *Deleted

## 2016-09-07 NOTE — Progress Notes (Signed)
Pt resides at [facility]. Pre-op instructions given to pt nurse, Mariann Laster, RN. Mariann Laster made aware to have pt stop taking  vitamins, fish oil and herbal medications. Do not take any NSAIDs ie: Ibuprofen, Advil, Naproxen, BC and Goody Powder. Nurse stated that pt only takes medications with pudding or applesauce; nurse then made aware to hold am medications . Please consider IV medications if possible, on DOS. Nurse denies that pt C/O SOB and chest pain. Nurses denies that pt is currently under the care of a cardiologist. Please complete Anesthesia Assessment and Apnea Screening on DOS, nurse unable to complete. Nurse made aware to check BG every 2 hours prior to arrival to hospital , administer glucose gel for BG < 70, recheck BG 15 minutes after intervention, if BG remains < 70 after gel , call SS unit. Nurse verbalized understanding of all pre-op instructions.

## 2016-09-08 ENCOUNTER — Encounter (HOSPITAL_COMMUNITY): Admission: RE | Disposition: E | Payer: Self-pay | Source: Ambulatory Visit | Attending: Vascular Surgery

## 2016-09-08 ENCOUNTER — Inpatient Hospital Stay (HOSPITAL_COMMUNITY)
Admission: RE | Admit: 2016-09-08 | Discharge: 2016-09-18 | DRG: 239 | Disposition: E | Payer: Medicare Other | Source: Ambulatory Visit | Attending: Vascular Surgery | Admitting: Vascular Surgery

## 2016-09-08 ENCOUNTER — Encounter (HOSPITAL_COMMUNITY): Payer: Self-pay | Admitting: Urology

## 2016-09-08 ENCOUNTER — Inpatient Hospital Stay (HOSPITAL_COMMUNITY): Payer: Medicare Other | Admitting: Certified Registered Nurse Anesthetist

## 2016-09-08 DIAGNOSIS — Z955 Presence of coronary angioplasty implant and graft: Secondary | ICD-10-CM

## 2016-09-08 DIAGNOSIS — R5381 Other malaise: Secondary | ICD-10-CM | POA: Diagnosis present

## 2016-09-08 DIAGNOSIS — F329 Major depressive disorder, single episode, unspecified: Secondary | ICD-10-CM | POA: Diagnosis present

## 2016-09-08 DIAGNOSIS — R1310 Dysphagia, unspecified: Secondary | ICD-10-CM | POA: Diagnosis present

## 2016-09-08 DIAGNOSIS — M79672 Pain in left foot: Secondary | ICD-10-CM | POA: Diagnosis present

## 2016-09-08 DIAGNOSIS — E1152 Type 2 diabetes mellitus with diabetic peripheral angiopathy with gangrene: Principal | ICD-10-CM | POA: Diagnosis present

## 2016-09-08 DIAGNOSIS — E11621 Type 2 diabetes mellitus with foot ulcer: Secondary | ICD-10-CM | POA: Diagnosis present

## 2016-09-08 DIAGNOSIS — I2510 Atherosclerotic heart disease of native coronary artery without angina pectoris: Secondary | ICD-10-CM | POA: Diagnosis present

## 2016-09-08 DIAGNOSIS — G309 Alzheimer's disease, unspecified: Secondary | ICD-10-CM | POA: Diagnosis present

## 2016-09-08 DIAGNOSIS — E039 Hypothyroidism, unspecified: Secondary | ICD-10-CM | POA: Diagnosis present

## 2016-09-08 DIAGNOSIS — Z66 Do not resuscitate: Secondary | ICD-10-CM | POA: Diagnosis present

## 2016-09-08 DIAGNOSIS — I130 Hypertensive heart and chronic kidney disease with heart failure and stage 1 through stage 4 chronic kidney disease, or unspecified chronic kidney disease: Secondary | ICD-10-CM | POA: Diagnosis present

## 2016-09-08 DIAGNOSIS — E669 Obesity, unspecified: Secondary | ICD-10-CM | POA: Diagnosis present

## 2016-09-08 DIAGNOSIS — F0280 Dementia in other diseases classified elsewhere without behavioral disturbance: Secondary | ICD-10-CM | POA: Diagnosis present

## 2016-09-08 DIAGNOSIS — E1129 Type 2 diabetes mellitus with other diabetic kidney complication: Secondary | ICD-10-CM | POA: Diagnosis present

## 2016-09-08 DIAGNOSIS — L97529 Non-pressure chronic ulcer of other part of left foot with unspecified severity: Secondary | ICD-10-CM | POA: Diagnosis present

## 2016-09-08 DIAGNOSIS — E1122 Type 2 diabetes mellitus with diabetic chronic kidney disease: Secondary | ICD-10-CM | POA: Diagnosis present

## 2016-09-08 DIAGNOSIS — L89153 Pressure ulcer of sacral region, stage 3: Secondary | ICD-10-CM | POA: Diagnosis present

## 2016-09-08 DIAGNOSIS — F0390 Unspecified dementia without behavioral disturbance: Secondary | ICD-10-CM | POA: Diagnosis not present

## 2016-09-08 DIAGNOSIS — Z0189 Encounter for other specified special examinations: Secondary | ICD-10-CM

## 2016-09-08 DIAGNOSIS — Z96641 Presence of right artificial hip joint: Secondary | ICD-10-CM | POA: Diagnosis present

## 2016-09-08 DIAGNOSIS — F03C0 Unspecified dementia, severe, without behavioral disturbance, psychotic disturbance, mood disturbance, and anxiety: Secondary | ICD-10-CM

## 2016-09-08 DIAGNOSIS — N182 Chronic kidney disease, stage 2 (mild): Secondary | ICD-10-CM | POA: Diagnosis present

## 2016-09-08 DIAGNOSIS — L97509 Non-pressure chronic ulcer of other part of unspecified foot with unspecified severity: Secondary | ICD-10-CM

## 2016-09-08 DIAGNOSIS — Z4659 Encounter for fitting and adjustment of other gastrointestinal appliance and device: Secondary | ICD-10-CM

## 2016-09-08 DIAGNOSIS — Z515 Encounter for palliative care: Secondary | ICD-10-CM | POA: Diagnosis not present

## 2016-09-08 DIAGNOSIS — L89612 Pressure ulcer of right heel, stage 2: Secondary | ICD-10-CM | POA: Diagnosis present

## 2016-09-08 DIAGNOSIS — R059 Cough, unspecified: Secondary | ICD-10-CM

## 2016-09-08 DIAGNOSIS — I5022 Chronic systolic (congestive) heart failure: Secondary | ICD-10-CM | POA: Diagnosis present

## 2016-09-08 DIAGNOSIS — T17908D Unspecified foreign body in respiratory tract, part unspecified causing other injury, subsequent encounter: Secondary | ICD-10-CM | POA: Diagnosis not present

## 2016-09-08 DIAGNOSIS — Z79899 Other long term (current) drug therapy: Secondary | ICD-10-CM | POA: Diagnosis not present

## 2016-09-08 DIAGNOSIS — T17908A Unspecified foreign body in respiratory tract, part unspecified causing other injury, initial encounter: Secondary | ICD-10-CM

## 2016-09-08 DIAGNOSIS — F419 Anxiety disorder, unspecified: Secondary | ICD-10-CM | POA: Diagnosis present

## 2016-09-08 DIAGNOSIS — R05 Cough: Secondary | ICD-10-CM

## 2016-09-08 DIAGNOSIS — I10 Essential (primary) hypertension: Secondary | ICD-10-CM | POA: Diagnosis not present

## 2016-09-08 DIAGNOSIS — R1319 Other dysphagia: Secondary | ICD-10-CM | POA: Diagnosis not present

## 2016-09-08 DIAGNOSIS — I70262 Atherosclerosis of native arteries of extremities with gangrene, left leg: Secondary | ICD-10-CM | POA: Diagnosis not present

## 2016-09-08 DIAGNOSIS — E118 Type 2 diabetes mellitus with unspecified complications: Secondary | ICD-10-CM | POA: Diagnosis not present

## 2016-09-08 DIAGNOSIS — Z6834 Body mass index (BMI) 34.0-34.9, adult: Secondary | ICD-10-CM | POA: Diagnosis not present

## 2016-09-08 HISTORY — DX: Acute respiratory failure, unspecified whether with hypoxia or hypercapnia: J96.00

## 2016-09-08 HISTORY — DX: Dysphasia: R47.02

## 2016-09-08 HISTORY — PX: AMPUTATION: SHX166

## 2016-09-08 HISTORY — DX: Depression, unspecified: F32.A

## 2016-09-08 HISTORY — DX: Major depressive disorder, single episode, unspecified: F32.9

## 2016-09-08 HISTORY — DX: Gangrene, not elsewhere classified: I96

## 2016-09-08 HISTORY — DX: Hypothermia, initial encounter: T68.XXXA

## 2016-09-08 HISTORY — DX: Hypothyroidism, unspecified: E03.9

## 2016-09-08 HISTORY — DX: Anxiety disorder, unspecified: F41.9

## 2016-09-08 HISTORY — DX: Bradycardia, unspecified: R00.1

## 2016-09-08 LAB — CBC
HCT: 26.9 % — ABNORMAL LOW (ref 36.0–46.0)
HEMATOCRIT: 21.8 % — AB (ref 36.0–46.0)
HEMOGLOBIN: 7.1 g/dL — AB (ref 12.0–15.0)
Hemoglobin: 8.8 g/dL — ABNORMAL LOW (ref 12.0–15.0)
MCH: 27.6 pg (ref 26.0–34.0)
MCH: 27.8 pg (ref 26.0–34.0)
MCHC: 32.6 g/dL (ref 30.0–36.0)
MCHC: 32.7 g/dL (ref 30.0–36.0)
MCV: 84.8 fL (ref 78.0–100.0)
MCV: 85.1 fL (ref 78.0–100.0)
PLATELETS: 292 10*3/uL (ref 150–400)
Platelets: 357 10*3/uL (ref 150–400)
RBC: 2.57 MIL/uL — ABNORMAL LOW (ref 3.87–5.11)
RBC: 3.16 MIL/uL — ABNORMAL LOW (ref 3.87–5.11)
RDW: 16.3 % — ABNORMAL HIGH (ref 11.5–15.5)
RDW: 15 % (ref 11.5–15.5)
WBC: 8.6 10*3/uL (ref 4.0–10.5)
WBC: 7 10*3/uL (ref 4.0–10.5)

## 2016-09-08 LAB — COMPREHENSIVE METABOLIC PANEL
ALBUMIN: 1.7 g/dL — AB (ref 3.5–5.0)
ALK PHOS: 51 U/L (ref 38–126)
ALT: 10 U/L — ABNORMAL LOW (ref 14–54)
ANION GAP: 11 (ref 5–15)
AST: 10 U/L — ABNORMAL LOW (ref 15–41)
BILIRUBIN TOTAL: 0.6 mg/dL (ref 0.3–1.2)
BUN: 18 mg/dL (ref 6–20)
CALCIUM: 8.6 mg/dL — AB (ref 8.9–10.3)
CO2: 23 mmol/L (ref 22–32)
CREATININE: 0.88 mg/dL (ref 0.44–1.00)
Chloride: 113 mmol/L — ABNORMAL HIGH (ref 101–111)
GFR calc non Af Amer: 59 mL/min — ABNORMAL LOW (ref >60)
GLUCOSE: 219 mg/dL — AB (ref 65–99)
Potassium: 4.1 mmol/L (ref 3.5–5.1)
Sodium: 147 mmol/L — ABNORMAL HIGH (ref 135–145)
TOTAL PROTEIN: 7.2 g/dL (ref 6.5–8.1)

## 2016-09-08 LAB — HEMOGLOBIN A1C
Hgb A1c MFr Bld: 7.9 % — ABNORMAL HIGH (ref 4.8–5.6)
Mean Plasma Glucose: 180 mg/dL

## 2016-09-08 LAB — GLUCOSE, CAPILLARY
GLUCOSE-CAPILLARY: 192 mg/dL — AB (ref 65–99)
Glucose-Capillary: 230 mg/dL — ABNORMAL HIGH (ref 65–99)
Glucose-Capillary: 212 mg/dL — ABNORMAL HIGH (ref 65–99)
Glucose-Capillary: 225 mg/dL — ABNORMAL HIGH (ref 65–99)
Glucose-Capillary: 175 mg/dL — ABNORMAL HIGH (ref 65–99)
Glucose-Capillary: 123 mg/dL — ABNORMAL HIGH (ref 65–99)

## 2016-09-08 LAB — CREATININE, SERUM
CREATININE: 0.9 mg/dL (ref 0.44–1.00)
GFR calc Af Amer: >60 mL/min (ref >60)
GFR, EST NON AFRICAN AMERICAN: 58 mL/min — AB (ref >60)

## 2016-09-08 LAB — PREPARE RBC (CROSSMATCH)

## 2016-09-08 SURGERY — AMPUTATION, ABOVE KNEE
Anesthesia: General | Laterality: Left

## 2016-09-08 MED ORDER — CARVEDILOL 6.25 MG PO TABS: 6.2500 mg | ORAL_TABLET | Freq: Two times a day (BID) | ORAL | Status: DC

## 2016-09-08 MED ORDER — LACTATED RINGERS IV SOLN
INTRAVENOUS | Status: DC
  Administered 2016-09-08: 10:00:00 via INTRAVENOUS

## 2016-09-08 MED ORDER — PHENYLEPHRINE HCL 10 MG/ML IJ SOLN
INTRAVENOUS | Status: DC | PRN
  Administered 2016-09-08: 80 ug/min via INTRAVENOUS

## 2016-09-08 MED ORDER — BACITRACIN ZINC 500 UNIT/GM EX OINT
TOPICAL_OINTMENT | CUTANEOUS | Status: AC
  Filled 2016-09-08: qty 28.35

## 2016-09-08 MED ORDER — PROPOFOL 10 MG/ML IV BOLUS
INTRAVENOUS | Status: DC | PRN
  Administered 2016-09-08: 100 mg via INTRAVENOUS

## 2016-09-08 MED ORDER — INSULIN ASPART 100 UNIT/ML ~~LOC~~ SOLN
0.0000 [IU] | Freq: Three times a day (TID) | SUBCUTANEOUS | Status: DC
  Administered 2016-09-08: 3 [IU] via SUBCUTANEOUS
  Administered 2016-09-09: 2 [IU] via SUBCUTANEOUS
  Administered 2016-09-10 (×2): 3 [IU] via SUBCUTANEOUS
  Administered 2016-09-10: 5 [IU] via SUBCUTANEOUS
  Administered 2016-09-11: 2 [IU] via SUBCUTANEOUS
  Administered 2016-09-11 – 2016-09-12 (×4): 3 [IU] via SUBCUTANEOUS
  Administered 2016-09-12 – 2016-09-13 (×4): 5 [IU] via SUBCUTANEOUS

## 2016-09-08 MED ORDER — TRAZODONE HCL 150 MG PO TABS
150.0000 mg | ORAL_TABLET | Freq: Every day | ORAL | Status: DC
  Administered 2016-09-08 – 2016-09-13 (×6): 150 mg via ORAL
  Filled 2016-09-08 (×6): qty 1

## 2016-09-08 MED ORDER — LEVOTHYROXINE SODIUM 50 MCG PO TABS
50.0000 ug | ORAL_TABLET | Freq: Every day | ORAL | Status: DC
  Administered 2016-09-09 – 2016-09-13 (×5): 50 ug via ORAL
  Filled 2016-09-08 (×5): qty 1

## 2016-09-08 MED ORDER — ONDANSETRON HCL 4 MG/2ML IJ SOLN: 4.0000 mg | Freq: Four times a day (QID) | INTRAMUSCULAR | Status: DC | PRN

## 2016-09-08 MED ORDER — SODIUM CHLORIDE 0.9 % IV SOLN: Freq: Once | INTRAVENOUS | Status: DC

## 2016-09-08 MED ORDER — LABETALOL HCL 5 MG/ML IV SOLN: 10.0000 mg | INTRAVENOUS | Status: DC | PRN

## 2016-09-08 MED ORDER — ONDANSETRON HCL 4 MG/2ML IJ SOLN
INTRAMUSCULAR | Status: AC
  Filled 2016-09-08: qty 2

## 2016-09-08 MED ORDER — SODIUM CHLORIDE 0.9 % IV SOLN: INTRAVENOUS | Status: DC

## 2016-09-08 MED ORDER — PRO-STAT SUGAR FREE PO LIQD: 30.0000 mL | Freq: Two times a day (BID) | ORAL | Status: DC

## 2016-09-08 MED ORDER — PHENOL 1.4 % MT LIQD: 1.0000 | OROMUCOSAL | Status: DC | PRN

## 2016-09-08 MED ORDER — CHLORHEXIDINE GLUCONATE CLOTH 2 % EX PADS: 6.0000 | MEDICATED_PAD | Freq: Once | CUTANEOUS | Status: DC

## 2016-09-08 MED ORDER — EPHEDRINE SULFATE 50 MG/ML IJ SOLN
INTRAMUSCULAR | Status: DC | PRN
  Administered 2016-09-08 (×2): 10 mg via INTRAVENOUS

## 2016-09-08 MED ORDER — OXYCODONE-ACETAMINOPHEN 5-325 MG PO TABS
1.0000 | ORAL_TABLET | ORAL | Status: DC | PRN
  Administered 2016-09-08: 1 via ORAL
  Administered 2016-09-09 – 2016-09-13 (×7): 2 via ORAL
  Filled 2016-09-08 (×4): qty 2
  Filled 2016-09-08: qty 1
  Filled 2016-09-08 (×2): qty 2

## 2016-09-08 MED ORDER — FENTANYL CITRATE (PF) 100 MCG/2ML IJ SOLN: 25.0000 ug | INTRAMUSCULAR | Status: DC | PRN

## 2016-09-08 MED ORDER — SODIUM CHLORIDE 0.9 % IV SOLN
INTRAVENOUS | Status: DC | PRN
  Administered 2016-09-08: 13:00:00 via INTRAVENOUS

## 2016-09-08 MED ORDER — AMLODIPINE BESYLATE 10 MG PO TABS
10.0000 mg | ORAL_TABLET | Freq: Every day | ORAL | Status: DC
  Administered 2016-09-10 – 2016-09-13 (×4): 10 mg via ORAL
  Filled 2016-09-08 (×4): qty 1

## 2016-09-08 MED ORDER — FENTANYL CITRATE (PF) 100 MCG/2ML IJ SOLN
INTRAMUSCULAR | Status: DC | PRN
  Administered 2016-09-08: 50 ug via INTRAVENOUS

## 2016-09-08 MED ORDER — DEXTROSE 5 % IV SOLN
1.5000 g | Freq: Two times a day (BID) | INTRAVENOUS | Status: AC
  Administered 2016-09-08 – 2016-09-09 (×2): 1.5 g via INTRAVENOUS
  Filled 2016-09-08 (×2): qty 1.5

## 2016-09-08 MED ORDER — DOCUSATE SODIUM 100 MG PO CAPS: 100.0000 mg | ORAL_CAPSULE | Freq: Every day | ORAL | Status: DC

## 2016-09-08 MED ORDER — SUGAMMADEX SODIUM 200 MG/2ML IV SOLN
INTRAVENOUS | Status: DC | PRN
  Administered 2016-09-08: 200 mg via INTRAVENOUS

## 2016-09-08 MED ORDER — METOPROLOL TARTRATE 5 MG/5ML IV SOLN: 2.0000 mg | INTRAVENOUS | Status: DC | PRN

## 2016-09-08 MED ORDER — CARVEDILOL 12.5 MG PO TABS
18.7500 mg | ORAL_TABLET | Freq: Two times a day (BID) | ORAL | Status: DC
  Administered 2016-09-10 – 2016-09-13 (×8): 18.75 mg via ORAL
  Filled 2016-09-08 (×8): qty 2

## 2016-09-08 MED ORDER — BISACODYL 10 MG RE SUPP: 10.0000 mg | Freq: Every day | RECTAL | Status: DC | PRN

## 2016-09-08 MED ORDER — SODIUM CHLORIDE 0.9 % IR SOLN
Status: DC | PRN
  Administered 2016-09-08: 1000 mL

## 2016-09-08 MED ORDER — GUAIFENESIN 100 MG/5ML PO SYRP
200.0000 mg | ORAL_SOLUTION | Freq: Four times a day (QID) | ORAL | Status: DC | PRN
  Filled 2016-09-08: qty 10

## 2016-09-08 MED ORDER — DONEPEZIL HCL 10 MG PO TABS
10.0000 mg | ORAL_TABLET | Freq: Every day | ORAL | Status: DC
  Administered 2016-09-08 – 2016-09-12 (×5): 10 mg via ORAL
  Filled 2016-09-08 (×5): qty 1

## 2016-09-08 MED ORDER — BACITRACIN ZINC 500 UNIT/GM EX OINT
TOPICAL_OINTMENT | CUTANEOUS | Status: DC | PRN
Start: 2016-09-08 — End: 2016-09-08
  Administered 2016-09-08: 1 via TOPICAL

## 2016-09-08 MED ORDER — ALLOPURINOL 300 MG PO TABS
150.0000 mg | ORAL_TABLET | Freq: Every day | ORAL | Status: DC
  Administered 2016-09-10 – 2016-09-13 (×4): 150 mg via ORAL
  Filled 2016-09-08 (×4): qty 1

## 2016-09-08 MED ORDER — MEMANTINE HCL ER 14 MG PO CP24
14.0000 mg | ORAL_CAPSULE | Freq: Every day | ORAL | Status: DC
  Administered 2016-09-08 – 2016-09-13 (×5): 14 mg via ORAL
  Filled 2016-09-08 (×6): qty 1

## 2016-09-08 MED ORDER — LOSARTAN POTASSIUM 50 MG PO TABS
100.0000 mg | ORAL_TABLET | Freq: Every day | ORAL | Status: DC
  Administered 2016-09-10 – 2016-09-13 (×4): 100 mg via ORAL
  Filled 2016-09-08 (×4): qty 2

## 2016-09-08 MED ORDER — LIDOCAINE HCL (CARDIAC) 20 MG/ML IV SOLN
INTRAVENOUS | Status: DC | PRN
  Administered 2016-09-08: 100 mg via INTRAVENOUS

## 2016-09-08 MED ORDER — PHENYLEPHRINE 40 MCG/ML (10ML) SYRINGE FOR IV PUSH (FOR BLOOD PRESSURE SUPPORT)
PREFILLED_SYRINGE | INTRAVENOUS | Status: DC | PRN
  Administered 2016-09-08 (×2): 120 ug via INTRAVENOUS

## 2016-09-08 MED ORDER — SODIUM CHLORIDE 0.9 % IV SOLN
INTRAVENOUS | Status: DC
  Administered 2016-09-08 – 2016-09-11 (×6): via INTRAVENOUS

## 2016-09-08 MED ORDER — MAGNESIUM SULFATE 2 GM/50ML IV SOLN
2.0000 g | Freq: Every day | INTRAVENOUS | Status: DC | PRN
  Filled 2016-09-08: qty 50

## 2016-09-08 MED ORDER — ONDANSETRON HCL 4 MG/2ML IJ SOLN
INTRAMUSCULAR | Status: DC | PRN
  Administered 2016-09-08: 4 mg via INTRAVENOUS

## 2016-09-08 MED ORDER — ACETAMINOPHEN 325 MG RE SUPP
325.0000 mg | RECTAL | Status: DC | PRN
  Administered 2016-09-14: 650 mg via RECTAL
  Filled 2016-09-08: qty 2

## 2016-09-08 MED ORDER — FENTANYL CITRATE (PF) 100 MCG/2ML IJ SOLN
INTRAMUSCULAR | Status: AC
  Filled 2016-09-08: qty 4

## 2016-09-08 MED ORDER — EPHEDRINE 5 MG/ML INJ
INTRAVENOUS | Status: AC
  Filled 2016-09-08: qty 10

## 2016-09-08 MED ORDER — ROCURONIUM BROMIDE 100 MG/10ML IV SOLN
INTRAVENOUS | Status: DC | PRN
  Administered 2016-09-08: 20 mg via INTRAVENOUS
  Administered 2016-09-08: 50 mg via INTRAVENOUS

## 2016-09-08 MED ORDER — MORPHINE SULFATE (PF) 2 MG/ML IV SOLN
2.0000 mg | INTRAVENOUS | Status: DC | PRN
  Administered 2016-09-08 – 2016-09-13 (×6): 2 mg via INTRAVENOUS
  Filled 2016-09-08 (×6): qty 1

## 2016-09-08 MED ORDER — ONDANSETRON HCL 4 MG/2ML IJ SOLN: 4.0000 mg | Freq: Once | INTRAMUSCULAR | Status: DC | PRN

## 2016-09-08 MED ORDER — VITAMIN D 1000 UNITS PO TABS
1000.0000 [IU] | ORAL_TABLET | Freq: Every day | ORAL | Status: DC
  Administered 2016-09-10 – 2016-09-13 (×4): 1000 [IU] via ORAL
  Filled 2016-09-08 (×4): qty 1

## 2016-09-08 MED ORDER — SUCCINYLCHOLINE CHLORIDE 200 MG/10ML IV SOSY
PREFILLED_SYRINGE | INTRAVENOUS | Status: AC
  Filled 2016-09-08: qty 10

## 2016-09-08 MED ORDER — ENOXAPARIN SODIUM 30 MG/0.3ML ~~LOC~~ SOLN
30.0000 mg | SUBCUTANEOUS | Status: DC
  Administered 2016-09-09 – 2016-09-13 (×5): 30 mg via SUBCUTANEOUS
  Filled 2016-09-08 (×4): qty 0.3

## 2016-09-08 MED ORDER — DEXTROSE 5 % IV SOLN
1.5000 g | INTRAVENOUS | Status: AC
  Administered 2016-09-08: 1.5 g via INTRAVENOUS
  Filled 2016-09-08: qty 1.5

## 2016-09-08 MED ORDER — FUROSEMIDE 20 MG PO TABS
20.0000 mg | ORAL_TABLET | Freq: Every day | ORAL | Status: DC
Start: 2016-09-09 — End: 2016-09-14
  Administered 2016-09-10 – 2016-09-13 (×4): 20 mg via ORAL
  Filled 2016-09-08 (×4): qty 1

## 2016-09-08 MED ORDER — AMMONIUM LACTATE 12 % EX LOTN
1.0000 "application " | TOPICAL_LOTION | Freq: Two times a day (BID) | CUTANEOUS | Status: DC
  Administered 2016-09-08 – 2016-09-13 (×10): 1 via TOPICAL
  Filled 2016-09-08: qty 400

## 2016-09-08 MED ORDER — POLYETHYLENE GLYCOL 3350 17 G PO PACK: 17.0000 g | PACK | Freq: Every day | ORAL | Status: DC | PRN

## 2016-09-08 MED ORDER — LORATADINE 10 MG PO TABS
10.0000 mg | ORAL_TABLET | Freq: Every day | ORAL | Status: DC
  Administered 2016-09-10 – 2016-09-13 (×4): 10 mg via ORAL
  Filled 2016-09-08 (×4): qty 1

## 2016-09-08 MED ORDER — POTASSIUM CHLORIDE CRYS ER 20 MEQ PO TBCR
20.0000 meq | EXTENDED_RELEASE_TABLET | Freq: Every day | ORAL | Status: DC | PRN
Start: 2016-09-08 — End: 2016-09-14

## 2016-09-08 MED ORDER — ACETAMINOPHEN 325 MG PO TABS
325.0000 mg | ORAL_TABLET | ORAL | Status: DC | PRN
  Administered 2016-09-11 – 2016-09-13 (×2): 650 mg via ORAL
  Filled 2016-09-08 (×2): qty 2

## 2016-09-08 MED ORDER — LORAZEPAM 0.5 MG PO TABS
0.5000 mg | ORAL_TABLET | Freq: Every day | ORAL | Status: DC
  Administered 2016-09-08 – 2016-09-13 (×6): 0.5 mg via ORAL
  Filled 2016-09-08 (×6): qty 1

## 2016-09-08 MED ORDER — HYDRALAZINE HCL 20 MG/ML IJ SOLN: 5.0000 mg | INTRAMUSCULAR | Status: DC | PRN

## 2016-09-08 MED ORDER — PANTOPRAZOLE SODIUM 40 MG PO TBEC
40.0000 mg | DELAYED_RELEASE_TABLET | Freq: Every day | ORAL | Status: DC
  Administered 2016-09-12 – 2016-09-13 (×2): 40 mg via ORAL
  Filled 2016-09-08 (×2): qty 1

## 2016-09-08 MED ORDER — PROPOFOL 10 MG/ML IV BOLUS
INTRAVENOUS | Status: AC
  Filled 2016-09-08: qty 20

## 2016-09-08 MED ORDER — RISPERIDONE 1 MG/ML PO SOLN
0.5000 mg | Freq: Two times a day (BID) | ORAL | Status: DC
  Administered 2016-09-08 – 2016-09-13 (×10): 0.5 mg via ORAL
  Filled 2016-09-08 (×13): qty 0.5

## 2016-09-08 MED ORDER — DIVALPROEX SODIUM 125 MG PO CSDR
250.0000 mg | DELAYED_RELEASE_CAPSULE | Freq: Three times a day (TID) | ORAL | Status: DC
  Administered 2016-09-08 – 2016-09-13 (×14): 250 mg via ORAL
  Filled 2016-09-08 (×18): qty 2

## 2016-09-08 MED ORDER — MINERAL OIL LIGHT OIL: 2.0000 [drp] | TOPICAL_OIL | Freq: Two times a day (BID) | Status: DC

## 2016-09-08 SURGICAL SUPPLY — 52 items
BANDAGE ELASTIC 4 VELCRO ST LF (GAUZE/BANDAGES/DRESSINGS) ×4 IMPLANT
BANDAGE ESMARK 6X9 LF (GAUZE/BANDAGES/DRESSINGS) IMPLANT
BLADE SAW RECIP 87.9 MT (BLADE) ×3 IMPLANT
BNDG CMPR 9X6 STRL LF SNTH (GAUZE/BANDAGES/DRESSINGS) ×1
BNDG COHESIVE 6X5 TAN STRL LF (GAUZE/BANDAGES/DRESSINGS) ×3 IMPLANT
BNDG ESMARK 6X9 LF (GAUZE/BANDAGES/DRESSINGS) ×3
BNDG GAUZE ELAST 4 BULKY (GAUZE/BANDAGES/DRESSINGS) ×3 IMPLANT
CANISTER SUCT 3000ML PPV (MISCELLANEOUS) ×3 IMPLANT
CLIP TI MEDIUM 6 (CLIP) ×2 IMPLANT
COVER SURGICAL LIGHT HANDLE (MISCELLANEOUS) ×3 IMPLANT
CUFF TOURNIQUET SINGLE 34IN LL (TOURNIQUET CUFF) ×2 IMPLANT
DRAPE ORTHO SPLIT 77X108 STRL (DRAPES) ×6
DRAPE SURG ORHT 6 SPLT 77X108 (DRAPES) ×2 IMPLANT
DRAPE U-SHAPE 47X51 STRL (DRAPES) ×3 IMPLANT
DRSG ADAPTIC 3X8 NADH LF (GAUZE/BANDAGES/DRESSINGS) ×3 IMPLANT
ELECT REM PT RETURN 9FT ADLT (ELECTROSURGICAL) ×3
ELECTRODE REM PT RTRN 9FT ADLT (ELECTROSURGICAL) ×1 IMPLANT
GAUZE SPONGE 4X4 12PLY STRL (GAUZE/BANDAGES/DRESSINGS) ×3 IMPLANT
GAUZE SPONGE 4X4 12PLY STRL LF (GAUZE/BANDAGES/DRESSINGS) ×2 IMPLANT
GLOVE BIO SURGEON STRL SZ 6.5 (GLOVE) ×2 IMPLANT
GLOVE BIO SURGEON STRL SZ7.5 (GLOVE) ×3 IMPLANT
GLOVE BIO SURGEONS STRL SZ 6.5 (GLOVE) ×2
GLOVE BIOGEL PI IND STRL 6.5 (GLOVE) IMPLANT
GLOVE BIOGEL PI IND STRL 7.5 (GLOVE) IMPLANT
GLOVE BIOGEL PI IND STRL 8 (GLOVE) ×1 IMPLANT
GLOVE BIOGEL PI INDICATOR 6.5 (GLOVE) ×4
GLOVE BIOGEL PI INDICATOR 7.5 (GLOVE) ×2
GLOVE BIOGEL PI INDICATOR 8 (GLOVE) ×2
GLOVE ECLIPSE 7.5 STRL STRAW (GLOVE) ×2 IMPLANT
GLOVE SURG SS PI 6.0 STRL IVOR (GLOVE) ×2 IMPLANT
GOWN STRL NON-REIN LRG LVL3 (GOWN DISPOSABLE) ×2 IMPLANT
GOWN STRL REUS W/ TWL LRG LVL3 (GOWN DISPOSABLE) ×3 IMPLANT
GOWN STRL REUS W/TWL LRG LVL3 (GOWN DISPOSABLE) ×6
KIT BASIN OR (CUSTOM PROCEDURE TRAY) ×3 IMPLANT
KIT ROOM TURNOVER OR (KITS) ×3 IMPLANT
NS IRRIG 1000ML POUR BTL (IV SOLUTION) ×3 IMPLANT
PACK GENERAL/GYN (CUSTOM PROCEDURE TRAY) ×3 IMPLANT
PAD ARMBOARD 7.5X6 YLW CONV (MISCELLANEOUS) ×6 IMPLANT
SLEEVE SURGEON STRL (DRAPES) ×2 IMPLANT
STAPLER VISISTAT (STAPLE) ×3 IMPLANT
STOCKINETTE IMPERVIOUS LG (DRAPES) ×3 IMPLANT
SUT SILK 0 TIES 10X30 (SUTURE) ×3 IMPLANT
SUT SILK 2 0 (SUTURE) ×3
SUT SILK 2 0 SH CR/8 (SUTURE) ×5 IMPLANT
SUT SILK 2-0 18XBRD TIE 12 (SUTURE) ×1 IMPLANT
SUT SILK 3 0 (SUTURE) ×3
SUT SILK 3-0 18XBRD TIE 12 (SUTURE) ×1 IMPLANT
SUT VIC AB 2-0 CT1 18 (SUTURE) ×3 IMPLANT
TOWEL OR 17X24 6PK STRL BLUE (TOWEL DISPOSABLE) ×3 IMPLANT
TOWEL OR 17X26 10 PK STRL BLUE (TOWEL DISPOSABLE) ×3 IMPLANT
UNDERPAD 30X30 (UNDERPADS AND DIAPERS) ×3 IMPLANT
WATER STERILE IRR 1000ML POUR (IV SOLUTION) ×3 IMPLANT

## 2016-09-08 NOTE — Op Note (Signed)
    NAME: Emily [name]    MRN: [id number] DOB: December 18, 1930    DATE OF OPERATION: 09/12/2016  PREOP DIAGNOSIS: Gangrene of left foot  POSTOP DIAGNOSIS: Same  PROCEDURE: Left AKA  SURGEON: Judeth Cornfield. [name], MD, FACS  ASSIST: Leontine Locket, PA  ANESTHESIA: Gen.   EBL: Minimal  INDICATIONS: [name] is a 83 y.o. female with an extensive wound of her left foot. She is non-ambulatory and severely demented. She is in a nursing home. She was not a candidate for revascularization and presents for primary left above-the-knee amputation.   FINDINGS: Healthy muscle with no signs of infection.  TECHNIQUE: The patient was brought to the operative room and received a general anesthetic. The left leg was prepped and draped in the usual sterile fashion. A fishmouth incision was marked above the level of the patella. Tourniquet had been placed on the upper thigh. The leg was exsanguinated with Esmarch bandage and tourniquet inflated to 300 mmHg. Under tourniquet control, the incision was carried down to the skin, subcutaneous tissue, fascia, muscle to the femur which was dissected free circumferentially. The periosteum was elevated and the bone divided proximal to the level of skin division. The femoral artery and vein were individually suture ligated with 2-0 silk ties. The tourniquet was then released. Additional hemostasis was obtained using electrocautery and 2-0 silk ties. The wound was irrigated with copious amounts of saline. The edges of the bone were rasped. The fascial layer was closed with interrupted 2-0 Vicryl's. The skin was closed with staples. Sterile dressing was applied. Patient tolerated well and was transferred to recovery in stable condition. All needle and sponge counts were correct.  Deitra Mayo, MD, FACS [facility]  DATE OF DICTATION:   09/04/2016

## 2016-09-08 NOTE — Anesthesia Procedure Notes (Signed)
Procedure Name: Intubation Date/Time: 09/07/2016 1:03 PM Performed by: Carney Living Pre-anesthesia Checklist: Patient identified, Emergency Drugs available, Suction available, Patient being monitored and Timeout performed Patient Re-evaluated:Patient Re-evaluated prior to inductionOxygen Delivery Method: Circle system utilized Preoxygenation: Pre-oxygenation with 100% oxygen Intubation Type: IV induction Ventilation: Mask ventilation without difficulty and Oral airway inserted - appropriate to patient size Laryngoscope Size: Mac and 4 Grade View: Grade I Tube type: Oral Tube size: 7.0 mm Number of attempts: 1 Airway Equipment and Method: Stylet Placement Confirmation: ETT inserted through vocal cords under direct vision,  positive ETCO2 and breath sounds checked- equal and bilateral Secured at: 22 cm Tube secured with: Tape Dental Injury: Teeth and Oropharynx as per pre-operative assessment

## 2016-09-08 NOTE — Anesthesia Preprocedure Evaluation (Addendum)
Anesthesia Evaluation  Patient identified by MRN, date of birth, ID band Patient confused    Reviewed: Allergy & Precautions, NPO status , Patient's Chart, lab work & pertinent test results, reviewed documented beta blocker date and time   Airway Mallampati: II  TM Distance: >3 FB Neck ROM: Full    Dental  (+) Teeth Intact, Dental Advisory Given   Pulmonary neg pulmonary ROS,    Pulmonary exam normal breath sounds clear to auscultation       Cardiovascular hypertension, Pt. on medications and Pt. on home beta blockers + CAD, + Cardiac Stents and +CHF  Normal cardiovascular exam Rhythm:Regular Rate:Normal     Neuro/Psych Seizures -,  PSYCHIATRIC DISORDERS Anxiety Depression Alzheimer's disease    GI/Hepatic Neg liver ROS, PUD, dysphagia   Endo/Other  diabetes, Type 2Hypothyroidism Obesity   Renal/GU Renal InsufficiencyRenal disease     Musculoskeletal  (+) Arthritis , Left foot ulcer   Abdominal   Peds  Hematology negative hematology ROS (+)   Anesthesia Other Findings Day of surgery medications reviewed with the patient.  Reproductive/Obstetrics                            Anesthesia Physical Anesthesia Plan  ASA: III  Anesthesia Plan: General   Post-op Pain Management:    Induction: Intravenous  Airway Management Planned: Oral ETT  Additional Equipment:   Intra-op Plan:   Post-operative Plan: Extubation in OR  Informed Consent: I have reviewed the patients History and Physical, chart, labs and discussed the procedure including the risks, benefits and alternatives for the proposed anesthesia with the patient or authorized representative who has indicated his/her understanding and acceptance.   Dental advisory given  Plan Discussed with: CRNA, Anesthesiologist and Surgeon  Anesthesia Plan Comments: (Risks/benefits of general anesthesia discussed with patient including risk  of damage to teeth, lips, gum, and tongue, nausea/vomiting, allergic reactions to medications, and the possibility of heart attack, stroke and death.  All patient questions answered.  Patient wishes to proceed.)       Anesthesia Quick Evaluation

## 2016-09-08 NOTE — Interval H&P Note (Signed)
History and Physical Interval Note:  09/06/2016 12:22 PM  Emily [name]  has presented today for surgery, with the diagnosis of Left foot ulcer L97.409  The various methods of treatment have been discussed with the patient and family. After consideration of risks, benefits and other options for treatment, the patient has consented to  Procedure(s): AMPUTATION ABOVE KNEE (Left) as a surgical intervention .  The patient's history has been reviewed, patient examined, no change in status, stable for surgery.  I have reviewed the patient's chart and labs.  Questions were answered to the patient's satisfaction.     Deitra Mayo

## 2016-09-08 NOTE — H&P (View-Only) (Signed)
Patient name: Emily [name] MRN: [id number] DOB: 06-04-1931 Sex: female  REASON FOR CONSULT: Diabetic foot ulcer.  HPI: Emily [name] is a 83 y.o. female, who is referred with extensive wounds of her left foot. She is a resident of [facility] [place] [facility]. She has severe dementia. She is nonambulatory. I am unable to obtain any history from her. Her family is here and is helpful with a history. She does have a history of coronary artery disease and has undergone PTCA in the remote past. She reportedly does have pain in both feet.  Past Medical History:  Diagnosis Date  . Alzheimer disease   . CAD (coronary artery disease)    Moderate coronary artery disease status post angioplasty to the right coronary artery in 2003 and an ejection fraction 46% on catheterization in 2006.  . CKD (chronic kidney disease)   . Diabetes mellitus   . Duodenal erosion 2009   EGD by Dr. [name]  . Gastric erosion 2009   EGD by Dr. [name]  . Hypertension   . Osteoarthritis   . Perforated ulcer ([place]) 2002   Perforated pyloric channel ulcer patched by Dr. [name]  . Psoriasis   . Systolic CHF ([place])    EF as low is 45%, but 55% in 2009    Family History  Problem Relation Age of Onset  . Cancer Mother     SOCIAL HISTORY: Social History   Social History  . Marital status: Married    Spouse name: N/A  . Number of children: N/A  . Years of education: N/A   Occupational History  . Not on file.   Social History Main Topics  . Smoking status: Never Smoker  . Smokeless tobacco: Never Used  . Alcohol use No  . Drug use: No  . Sexual activity: Not on file   Other Topics Concern  . Not on file   Social History Narrative  . No narrative on file    No Known Allergies  Current Outpatient Prescriptions  Medication Sig Dispense Refill  . acetaminophen (TYLENOL) 325 MG tablet Take 1,300 mg by mouth daily.    Marland Kitchen acetaminophen (TYLENOL) 500 MG tablet Take 500 mg by mouth every 4 (four) hours as  needed for mild pain, fever or headache.    . allopurinol (ZYLOPRIM) 300 MG tablet Take 150 mg by mouth daily.    Marland Kitchen alum & mag hydroxide-simeth (MINTOX) [phone] MG/5ML suspension Take 30 mLs by mouth every 6 (six) hours as needed for indigestion or heartburn.    Marland Kitchen amLODipine (NORVASC) 10 MG tablet Take 10 mg by mouth daily.     Marland Kitchen ammonium lactate (AMLACTIN) 12 % cream Apply 1 g topically 2 (two) times daily.    . bag balm OINT ointment Apply 1 application topically 2 (two) times daily.    . carvedilol (COREG) 12.5 MG tablet Take 18.75 mg by mouth 2 (two) times daily with a meal.     . cetirizine (ZYRTEC) 5 MG tablet Take 5 mg by mouth daily.    . cholecalciferol (VITAMIN D) 1000 UNITS tablet Take 1,000 Units by mouth daily.     . divalproex (DEPAKOTE) 125 MG DR tablet Take 250 mg by mouth 3 (three) times daily.     Marland Kitchen donepezil (ARICEPT) 10 MG tablet Take 10 mg by mouth at bedtime.     . furosemide (LASIX) 40 MG tablet Take 40 mg by mouth daily.     Marland Kitchen guaifenesin (ROBITUSSIN) 100 MG/5ML  syrup Take 200 mg by mouth every 6 (six) hours as needed for cough.    . levothyroxine (SYNTHROID, LEVOTHROID) 50 MCG tablet Take 1 tablet (50 mcg total) by mouth daily before breakfast. 30 tablet 0  . loperamide (IMODIUM) 2 MG capsule Take 2 mg by mouth daily as needed for diarrhea or loose stools.    Marland Kitchen LORazepam (ATIVAN) 0.5 MG tablet Take 0.5 mg by mouth at bedtime.    Marland Kitchen losartan (COZAAR) 100 MG tablet Take 100 mg by mouth daily.     . magnesium hydroxide (MILK OF MAGNESIA) 400 MG/5ML suspension Take 30 mLs by mouth daily as needed for mild constipation.    . memantine (NAMENDA XR) 14 MG CP24 24 hr capsule Take 14 mg by mouth daily.    . Menthol, Topical Analgesic, (BIOFREEZE EX) Apply 1 application topically 2 (two) times daily.    . mineral oil external liquid Place 2 drops into both ears 2 (two) times daily.    . mirtazapine (REMERON) 15 MG tablet Take 15 mg by mouth at bedtime.    Marland Kitchen  neomycin-bacitracin-polymyxin (NEOSPORIN) [phone] ointment Apply 1 application topically daily as needed (minor skin tears of abrasions.).    Marland Kitchen predniSONE (DELTASONE) 10 MG tablet Take 1 tablet (10 mg total) by mouth as directed. Take 40 mg po daily for the next 2 days, then 30 mg po daily for the next 2 days, then 20 mg po daily for the next 2 days, then take 10 mg po daily for the next 2 days 30 tablet 0  . risperiDONE (RISPERDAL) 1 MG/ML oral solution Take 0.5 mg by mouth 2 (two) times daily. Mix in drink    . traZODone (DESYREL) 150 MG tablet Take 150 mg by mouth at bedtime.     No current facility-administered medications for this visit.     REVIEW OF SYSTEMS: Unable to obtain.   PHYSICAL EXAM: Vitals:   08/25/16 1409  BP: 124/74  Pulse: 81  Resp: 18  Temp: 98.7 F (37.1 C)  TempSrc: Oral  SpO2: 96%  Weight: 214 lb (97.1 kg)  Height: 5\' 8"  (1.727 m)    GENERAL: The patient is a well-nourished female, in no acute distress. The vital signs are documented above. CARDIAC: There is a regular rate and rhythm.  VASCULAR: I do not detect carotid bruits. She has palpable femoral pulses. I cannot palpate pedal pulses. She has mild bilateral lower extremity swelling. PULMONARY: There is good air exchange bilaterally without wheezing or rales. ABDOMEN: Soft and non-tender with normal pitched bowel sounds.  MUSCULOSKELETAL: There are no major deformities or cyanosis. NEUROLOGIC: No focal weakness or paresthesias are detected. SKIN: She has a full-thickness wound on her left heel which goes to the calcaneus. She also has a full-thickness wound on her medial malleolus which also appears to extend very close to the bone. There is no significant cellulitis or drainage noted. PSYCHIATRIC: The patient is responsive to voice.  DATA:   X-RAY LEFT CALCANEUS: This shows no evidence of bony pathology or fracture.  BILATERAL LOWER EXTREMITY ARTERIAL DOPPLER STUDY: I have independently  interpreted the bilateral lower extremity arterial Doppler study.  On the right side there is a monophasic dorsalis pedis signal with an ABI 53%. Toe pressure is 50 mmHg.  On the left side, which is the symptomatic site, there is a monophasic dorsalis pedis signal. ABI is 45%. Toe pressure is 0.  MEDICAL ISSUES:  NONSALVAGEABLE LEFT LOWER EXTREMITY: His is a elderly debilitated woman  with severe dementia who is nonambulatory who presents with extensive wounds on her left foot and severe infrainguinal arterial occlusive disease. She is clearly not a candidate for revascularization. The options would be palliative care versus a left above-the-knee amputation. I've discussed the risks of both approaches and the family will discuss this before making a decision. If they would like to proceed with AKA we can schedule this in the near future. Currently the wounds do not appear to be a source of sepsis as there is no significant cellulitis or drainage.   Deitra Mayo [facility] [place] [phone]

## 2016-09-08 NOTE — Progress Notes (Signed)
Dr. Gifford Shave notified of patient's blood sugar, will continue to monitor patient.

## 2016-09-08 NOTE — Transfer of Care (Signed)
Immediate Anesthesia Transfer of Care Note  Patient: Emily [name]  Procedure(s) Performed: Procedure(s): AMPUTATION ABOVE KNEE (Left)  Patient Location: PACU  Anesthesia Type:General  Level of Consciousness: awake and lethargic  Airway & Oxygen Therapy: Patient Spontanous Breathing and Patient connected to face mask oxygen  Post-op Assessment: Report given to RN and Post -op Vital signs reviewed and stable  Post vital signs: Reviewed and stable  Last Vitals:  Vitals:   09/06/2016 1415 09/07/2016 1419  BP: 98/64   Pulse: 77 77  Resp: (!) 25 (!) 24  Temp:      Last Pain:  Vitals:   09/13/2016 1419  TempSrc:   PainSc: 0-No pain      Patients Stated Pain Goal: 1 (00/93/81 8299)  Complications: No apparent anesthesia complications

## 2016-09-08 NOTE — Progress Notes (Signed)
Patient lying in bed, grandaughter and great grandchildren at bedside. Patient very sleepy, call light within reach

## 2016-09-09 ENCOUNTER — Telehealth: Payer: Self-pay | Admitting: Vascular Surgery

## 2016-09-09 ENCOUNTER — Inpatient Hospital Stay (HOSPITAL_COMMUNITY): Payer: Medicare Other

## 2016-09-09 ENCOUNTER — Encounter (HOSPITAL_COMMUNITY): Payer: Self-pay | Admitting: Vascular Surgery

## 2016-09-09 LAB — GLUCOSE, CAPILLARY
GLUCOSE-CAPILLARY: 109 mg/dL — AB (ref 65–99)
Glucose-Capillary: 148 mg/dL — ABNORMAL HIGH (ref 65–99)
Glucose-Capillary: 128 mg/dL — ABNORMAL HIGH (ref 65–99)
Glucose-Capillary: 93 mg/dL (ref 65–99)

## 2016-09-09 LAB — TYPE AND SCREEN
ABO/RH(D): O POS
ANTIBODY SCREEN: NEGATIVE
UNIT DIVISION: 0
Unit division: 0

## 2016-09-09 LAB — BASIC METABOLIC PANEL
Anion gap: 8 (ref 5–15)
BUN: 19 mg/dL (ref 6–20)
CHLORIDE: 112 mmol/L — AB (ref 101–111)
CO2: 25 mmol/L (ref 22–32)
CREATININE: 1 mg/dL (ref 0.44–1.00)
Calcium: 7.9 mg/dL — ABNORMAL LOW (ref 8.9–10.3)
GFR calc Af Amer: 59 mL/min — ABNORMAL LOW (ref >60)
GFR calc non Af Amer: 51 mL/min — ABNORMAL LOW (ref >60)
Glucose, Bld: 157 mg/dL — ABNORMAL HIGH (ref 65–99)
Potassium: 4.7 mmol/L (ref 3.5–5.1)
SODIUM: 145 mmol/L (ref 135–145)

## 2016-09-09 LAB — CBC
HCT: 25.8 % — ABNORMAL LOW (ref 36.0–46.0)
Hemoglobin: 8.4 g/dL — ABNORMAL LOW (ref 12.0–15.0)
MCH: 27.7 pg (ref 26.0–34.0)
MCHC: 32.6 g/dL (ref 30.0–36.0)
MCV: 85.1 fL (ref 78.0–100.0)
PLATELETS: 286 10*3/uL (ref 150–400)
RBC: 3.03 MIL/uL — ABNORMAL LOW (ref 3.87–5.11)
RDW: 15.6 % — AB (ref 11.5–15.5)
WBC: 8.5 10*3/uL (ref 4.0–10.5)

## 2016-09-09 LAB — BPAM RBC
BLOOD PRODUCT EXPIRATION DATE: 201804132359
Blood Product Expiration Date: 201804132359
ISSUE DATE / TIME: 201803221229
ISSUE DATE / TIME: 201803221229
UNIT TYPE AND RH: 5100
Unit Type and Rh: 5100

## 2016-09-09 MED ORDER — JEVITY 1.2 CAL PO LIQD
1000.0000 mL | ORAL | Status: DC
  Administered 2016-09-09 – 2016-09-13 (×5): 1000 mL
  Filled 2016-09-09 (×9): qty 1000

## 2016-09-09 MED ORDER — JEVITY 1.2 CAL PO LIQD
1000.0000 mL | ORAL | Status: DC
  Filled 2016-09-09 (×2): qty 1000

## 2016-09-09 MED ORDER — PRO-STAT SUGAR FREE PO LIQD
30.0000 mL | Freq: Two times a day (BID) | ORAL | Status: DC
  Administered 2016-09-10 – 2016-09-13 (×6): 30 mL
  Filled 2016-09-09 (×5): qty 30

## 2016-09-09 NOTE — Anesthesia Postprocedure Evaluation (Signed)
Anesthesia Post Note  Patient: Emily [name]  Procedure(s) Performed: Procedure(s) (LRB): AMPUTATION ABOVE KNEE (Left)  Patient location during evaluation: PACU Anesthesia Type: General Level of consciousness: awake and alert Pain management: pain level controlled Vital Signs Assessment: post-procedure vital signs reviewed and stable Respiratory status: spontaneous breathing, nonlabored ventilation, respiratory function stable and patient connected to nasal cannula oxygen Cardiovascular status: blood pressure returned to baseline and stable Postop Assessment: no signs of nausea or vomiting Anesthetic complications: no       Last Vitals:  Vitals:   09/12/2016 1939 09/09/16 0714  BP: (!) 115/59 (!) 119/95  Pulse: 75 82  Resp: 20 18  Temp: 36.4 C 36.8 C    Last Pain:  Vitals:   09/09/16 0714  TempSrc: Axillary  PainSc:                  Catalina Gravel

## 2016-09-09 NOTE — Progress Notes (Signed)
PT Cancellation Note  Patient Details Name: Emily [name] MRN: [id number] DOB: 07-23-1930   Cancelled Treatment:    Reason Eval/Treat Not Completed: PT screened, no needs identified, will sign off. Per chart review, pt total care at [facility]. Spoke with RN and she confirmed pt with severe dementia, nonambulatory and nonverbal. Skilled PT intervention not indicated.   Lorriane Shire 09/09/2016, 10:08 AM

## 2016-09-09 NOTE — Progress Notes (Signed)
Patient given meds via tube and tolerated well. Pump  programmed at an increase of 10 mL to 33ml. Pump flushed prior and after medication given. Daughter present at bedside, call light within reach.

## 2016-09-09 NOTE — Progress Notes (Signed)
Initial Nutrition Assessment  DOCUMENTATION CODES:   Obesity unspecified  INTERVENTION:    Once CORTRAK tube placed, initiate Jevity 1.2 formula at 20 ml/hr and increase by 10 ml every 4 hours to goal rate of 60 ml/hr   Prostat liquid protein 30 ml BID via tube  Total TF regimen to provide 1928 kcals, 110 gm protein, 1162 ml of free water  NUTRITION DIAGNOSIS:   Inadequate oral intake related to inability to eat as evidenced by NPO status  GOAL:   Patient will meet greater than or equal to 90% of their needs  MONITOR:   TF tolerance, Labs, Weight trends, I & O's, Skin  REASON FOR ASSESSMENT:   Consult, Low Braden Enteral/tube feeding initiation and management  ASSESSMENT:   83 y.o. Female with an extensive wound of her left foot. She is non-ambulatory and severely demented. She is in a nursing home. She was not a candidate for revascularization.  Pt s/p procedure 3/22: LEFT ABOVE KNEE AMPUTATION   S/p bedside swallow evaluation.  Pt poorly responsive. Plan is for [place] small bore feeding tube placement. Jevity 1.2 formula ordered per Adult Tube Feeding Protocol. Labs and medications reviewed.  CBG's [phone].  RD unable to complete Nutrition-Focused physical exam at this time.   Diet Order:  Diet NPO time specified  Skin:  Reviewed, no issues  Last BM:  3/22  Height:   Ht Readings from Last 1 Encounters:  09/09/16 5\' 8"  (1.727 m)   Weight:   Wt Readings from Last 1 Encounters:  09/09/16 213 lb 13.5 oz (97 kg)   Ideal Body Weight:  63.6 kg  BMI:  36.4 kg/m2 (adjusted for AKA)  Estimated Nutritional Needs:   Kcal:  1800-2000  Protein:  100-110 gm  Fluid:  1.8-2.0 L  EDUCATION NEEDS:   No education needs identified at this time  Arthur Holms, RD, LDN Pager #: [phone] After-Hours Pager #: [phone]

## 2016-09-09 NOTE — Care Management Note (Signed)
Case Management Note Marvetta Gibbons RN, BSN Unit 2W-Case Manager [phone]  Patient Details  Name: Emily [name] MRN: [id number] Date of Birth: 02/14/1931  Subjective/Objective:  Pt admitted s/p AKA                  Action/Plan: PTA pt was at [facility] [place] consulted for placement needs- pt will need to return to SNF when medically stable  Expected Discharge Date:                  Expected Discharge Plan:  Skilled Nursing Facility  In-House Referral:  Clinical Social Work  Discharge planning Services     Post Acute Care Choice:  NA Choice offered to:  NA  DME Arranged:    DME Agency:     HH Arranged:    [place] Agency:     Status of Service:  Completed, signed off  If discussed at [employer], dates discussed:    Additional Comments:  Dawayne Patricia, RN 09/09/2016, 2:47 PM

## 2016-09-09 NOTE — Progress Notes (Signed)
OT Cancellation Note and Discharge  Patient Details Name: Emily [name] MRN: [id number] DOB: [birth year]   Cancelled Treatment:    Reason Eval/Treat Not Completed: OT screened, no needs identified, will sign off. Per PT note from earlier today pt total care at SNF, pt with severe dementia, nonambulatory and nonverbal. Skilled OT intervention not indicated.   Emily [name], Emily [name] [phone] 09/09/2016, 12:28 PM

## 2016-09-09 NOTE — Telephone Encounter (Signed)
Sched appt 10/12/16 at 1:00 PA. Spoke to pt's facility [facility] to inform them of appt.

## 2016-09-09 NOTE — Evaluation (Signed)
Clinical/Bedside Swallow Evaluation Patient Details  Name: Emily [name] MRN: [id number] Date of Birth: Dec 10, 1930  Today's Date: 09/09/2016 Time: SLP Start Time (ACUTE ONLY): 0950 SLP Stop Time (ACUTE ONLY): 1020 SLP Time Calculation (min) (ACUTE ONLY): 30 min  Past Medical History:  Past Medical History:  Diagnosis Date  . Acute respiratory failure ([place])   . Alzheimer disease   . Anxiety   . Bradycardia   . CAD (coronary artery disease)    Moderate coronary artery disease status post angioplasty to the right coronary artery in 2003 and an ejection fraction 46% on catheterization in 2006.  . CKD (chronic kidney disease)   . Depression    in remission  . Diabetes mellitus   . Duodenal erosion 2009   EGD by Dr. [name]  . Dysphasia   . Gangrene ([place])   . Gastric erosion 2009   EGD by Dr. [name]  . Hypertension   . Hypothermia   . Hypothyroidism   . Osteoarthritis   . Perforated ulcer ([place]) 2002   Perforated pyloric channel ulcer patched by Dr. [name]  . Psoriasis   . Systolic CHF ([place])    EF as low is 45%, but 55% in 2009   Past Surgical History:  Past Surgical History:  Procedure Laterality Date  . TOTAL HIP ARTHROPLASTY Right 2007   HPI:  83 y.o.femalefrom SNF admitted for L AKA due to extensive wound of her left foot. She is non-ambulatory and severely demented. Intubated 3/22 for procedure. Swallow evaluation at [facility] 04/11/16 - required careful hand-feeding, pureed food and thin liquids, no overt s/s of aspiration at that time.  Weight loss, poor appetite since that time per dtr.   Assessment / Plan / Recommendation Clinical Impression  Pt with advanced dementia and baseline dysphagia, now exacerbated and is a high aspiration risk.  Poorly responsive today, not following commands nor attentive to surroundings.   Audible, wet respirations; set-up oral suctioning and provided mouth care - pt not spontaneously swallowing.  Copious secretions removed from oral cavity;  they are accumulating and she is likely aspirating them.  No recognition/anticipation of approaching ice chips.  Pt unable to take oral alimentation.  D/W daughter. Given pt is only one day post-op, consider temporary enteral feeding (Cortrak) with hope that MS improves and oral diet can be resumed.  Would not recommend PEG - unequivocally contraindicated in advanced dementia. If progress  is poor, consider Palliative Care consult.  D/W RN. SLP Visit Diagnosis: Dysphagia, unspecified (R13.10)    Aspiration Risk  Severe aspiration risk    Diet Recommendation   npo  Medication Administration: Via alternative means    Other  Recommendations Oral Care Recommendations: Oral care QID   Follow up Recommendations  (tba)      Frequency and Duration min 2x/week  1 week       Prognosis Prognosis for Safe Diet Advancement: Fair      Swallow Study   General Date of Onset: 08/22/2016 HPI: 83 y.o.femalefrom SNF admitted for L AKA due to extensive wound of her left foot. She is non-ambulatory and severely demented. Intubated 3/22 for procedure. Swallow evaluation at [facility] 04/11/16 - required careful hand-feeding, pureed food and thin liquids, no overt s/s of aspiration at that time.  Type of Study: Bedside Swallow Evaluation Previous Swallow Assessment: see HPI Diet Prior to this Study: NPO Temperature Spikes Noted: No Respiratory Status: Nasal cannula History of Recent Intubation: Yes Length of Intubations (days):  (for procedure) Date extubated: 09/06/2016 Behavior/Cognition:  Lethargic/Drowsy Oral Cavity Assessment: Excessive secretions Oral Care Completed by SLP: Yes Oral Cavity - Dentition: Missing dentition Vision: Impaired for self-feeding Self-Feeding Abilities: Total assist Patient Positioning: Upright in bed Baseline Vocal Quality: Not observed Volitional Cough: Cognitively unable to elicit Volitional Swallow: Unable to elicit    Oral/Motor/Sensory Function Overall Oral  Motor/Sensory Function: Other (comment) (symmetric at rest; not f/c)   Ice Chips Ice chips: Impaired Presentation: Spoon Oral Phase Impairments: Poor awareness of bolus Oral Phase Functional Implications: Oral holding Pharyngeal Phase Impairments:  (no spontaneous swallows)   Thin Liquid Thin Liquid: Not tested    Nectar Thick Nectar Thick Liquid: Not tested   Honey Thick Honey Thick Liquid: Not tested   Puree Puree: Not tested   Solid   GO   Solid: Not tested        Emily [name] 09/09/2016,10:56 AM

## 2016-09-09 NOTE — Progress Notes (Signed)
Cortrak tube inserted to 90cm in patients right nare. Pt tolerated insertion well. Xray ordered and tube added to assessment. Please save tube if it become dislodged, it can be reinserted. Please contact cortrak team with any questions or concerns.

## 2016-09-09 NOTE — Progress Notes (Addendum)
[facility]  VASCULAR SURGERY ASSESSMENT & PLAN:   POD 1 S/P Left AKA. Dressing is dry. Dressing change tomorrow.   The only other issue is her swallowing. The nurse was concerned that she was not swallowing her medications. This prompted a consult to speech and pathology who evaluated her swallowing. There are impression was that she had advanced dementia with underlying dysphasia which was exacerbated with her recent surgery. Because of the risk of aspiration, they have recommended a temporary enteral feeding tube. If her mental status improves she could be converted back to an oral diet. Given her advanced dementia she would not be a good candidate for a PEG. I have discussed this with the family and they are agreeable to having a temporary feeding tube placed.   She currently lives at [place] home in the family is considering sending her somewhere else. Discharge planning team is working with them on this.  Deitra Mayo, MD, FACS Beeper [phone] Office: [phone]  Subjective  - Daughter in room.   Objective (!) 119/95 82 98.3 F (36.8 C) (Axillary) 18 100%  Intake/Output Summary (Last 24 hours) at 09/09/16 0744 Last data filed at 08/23/2016 1415  Gross per 24 hour  Intake             1770 ml  Output                0 ml  Net             1770 ml    Left AKA dressing clean and dry Right foot in protective soft boot, heel is dark no open wound   Assessment/Planning: POD # 1 Left AKA  Nursing is reporting sacral wound.  I asked for staging and measurements, protective side lying, turning often and sacral dressing is on. Plan to change left AKA dressing tomorrow. Will consult social work to help family with issues involving returning to present SNF verses a different facility  Laurence Slate [facility] 09/09/2016 7:44 AM --  Laboratory Lab Results:  Recent Labs  09/11/2016 1558 09/09/16 0210  WBC 7.0 8.5  HGB 8.8* 8.4*   HCT 26.9* 25.8*  PLT 292 286   BMET  Recent Labs  08/19/2016 1001 09/09/2016 1558 09/09/16 0210  NA 147*  --  145  K 4.1  --  4.7  CL 113*  --  112*  CO2 23  --  25  GLUCOSE 219*  --  157*  BUN 18  --  19  CREATININE 0.88 0.90 1.00  CALCIUM 8.6*  --  7.9*    COAG Lab Results  Component Value Date   INR 1.30 04/09/2016   INR 1.09 11/02/2015   INR 1.26 03/03/2013   No results found for: PTT

## 2016-09-09 NOTE — Progress Notes (Signed)
Received call from daughter [name]. Does not want [place] to give any family members any information, she will communicate with them. Wants a password--[employer]. Placed information on account.

## 2016-09-09 NOTE — Telephone Encounter (Signed)
-----   Message from Denman George, RN sent at 09/09/2016  4:05 PM EDT ----- Regarding: needs 4 week f/u with Dr. [name] for post op check/staple removal   ----- Message ----- From: Angelia Mould, MD Sent: 09/12/2016   2:30 PM To: Vvs Charge Pool Subject: charge                                         PROCEDURE: Left AKA  SURGEON: Judeth Cornfield. [name], MD, FACS  ASSIST: Leontine Locket, PA  She will need a follow up visit in 1 month for removal of her staples. Thank you. CD

## 2016-09-10 LAB — GLUCOSE, CAPILLARY
GLUCOSE-CAPILLARY: 204 mg/dL — AB (ref 65–99)
GLUCOSE-CAPILLARY: 175 mg/dL — AB (ref 65–99)
Glucose-Capillary: 188 mg/dL — ABNORMAL HIGH (ref 65–99)
Glucose-Capillary: 175 mg/dL — ABNORMAL HIGH (ref 65–99)

## 2016-09-10 LAB — CBC
HCT: 25.5 % — ABNORMAL LOW (ref 36.0–46.0)
HEMOGLOBIN: 8.2 g/dL — AB (ref 12.0–15.0)
MCH: 28.4 pg (ref 26.0–34.0)
MCHC: 32.2 g/dL (ref 30.0–36.0)
MCV: 88.2 fL (ref 78.0–100.0)
PLATELETS: 270 10*3/uL (ref 150–400)
RBC: 2.89 MIL/uL — AB (ref 3.87–5.11)
RDW: 16.4 % — ABNORMAL HIGH (ref 11.5–15.5)
WBC: 7.9 10*3/uL (ref 4.0–10.5)

## 2016-09-10 LAB — BASIC METABOLIC PANEL
ANION GAP: 9 (ref 5–15)
BUN: 19 mg/dL (ref 6–20)
CALCIUM: 7.7 mg/dL — AB (ref 8.9–10.3)
CO2: 23 mmol/L (ref 22–32)
CREATININE: 1.01 mg/dL — AB (ref 0.44–1.00)
Chloride: 116 mmol/L — ABNORMAL HIGH (ref 101–111)
GFR, EST AFRICAN AMERICAN: 58 mL/min — AB (ref >60)
GFR, EST NON AFRICAN AMERICAN: 50 mL/min — AB (ref >60)
Glucose, Bld: 143 mg/dL — ABNORMAL HIGH (ref 65–99)
Potassium: 4.3 mmol/L (ref 3.5–5.1)
Sodium: 148 mmol/L — ABNORMAL HIGH (ref 135–145)

## 2016-09-10 LAB — MRSA PCR SCREENING: MRSA BY PCR: NEGATIVE

## 2016-09-10 MED ORDER — COLLAGENASE 250 UNIT/GM EX OINT
TOPICAL_OINTMENT | Freq: Every day | CUTANEOUS | Status: DC
  Administered 2016-09-12 – 2016-09-13 (×2): via TOPICAL
  Filled 2016-09-10: qty 30

## 2016-09-10 MED ORDER — GLYCOPYRROLATE 0.2 MG/ML IJ SOLN
0.2000 mg | Freq: Four times a day (QID) | INTRAMUSCULAR | Status: DC | PRN
  Filled 2016-09-10: qty 1

## 2016-09-10 MED ORDER — CHLORHEXIDINE GLUCONATE 0.12 % MT SOLN
15.0000 mL | Freq: Two times a day (BID) | OROMUCOSAL | Status: DC
  Administered 2016-09-10 – 2016-09-13 (×8): 15 mL via OROMUCOSAL
  Filled 2016-09-10 (×6): qty 15

## 2016-09-10 MED ORDER — ORAL CARE MOUTH RINSE
15.0000 mL | Freq: Two times a day (BID) | OROMUCOSAL | Status: DC
  Administered 2016-09-10 – 2016-09-13 (×8): 15 mL via OROMUCOSAL

## 2016-09-10 NOTE — Consult Note (Signed)
[place] Nurse wound consult note Reason for Consult: Patient with Stage 2 pressure injury to right heel and stage 3 pressure injury to sacrum. Minor moisture associated skin damage to the periwound at the sacrum, resolving  Wound type:Pressure, moisture Pressure Injury POA: Yes Measurement:Sacrum with 4cm x 4cm x 0.2cm Stage 3 pressure injury with scattered threads of yellow slough obscuring 20% of wound bed.  80% of wound bed is red, moist with scant serous exudate.There is maceration in the surrounding wound area in a 1cm  "ring".  Right heel with 2cm round x 0.1cm Stage 2 pressure injury. Red, moist, scant serous exudate. Perianal area with IAD, there is a thin layer of our moisture barrier ointment in this region and that should improve over time with cleansing and repositioning. Wound bed:As described above Drainage (amount, consistency, odor) As described above Periwound: Intact, maceration at sacral ulcer as described above. Dressing procedure/placement/frequency:  A pressure redistribution mattress with low air loss feature is ordered.  Dressing to the right partial thickness tissue loss will be with white petrolatum dressing once daily.  The sacral ulcer will be treated with collagenase (Santyl) once daily topped with a saline dressing, dry dressing and covered with foam. Nursing has been given guidance for turning and repositioning. HOB has to be elevated 30 degrees due to tube feedings, but no higher than that. [place] nursing team will not follow, but will remain available to this patient, the nursing and medical teams.  Please re-consult if needed. Thanks, Maudie Flakes, MSN, RN, [place], Arther Abbott  Pager# [phone]

## 2016-09-10 NOTE — Progress Notes (Addendum)
[facility] [place]  Subjective  - Does not verbally communicate.  Daughter at bedside.   Objective 127/66 83 99.8 F (37.7 C) (Oral) 18 91%  Intake/Output Summary (Last 24 hours) at 09/10/16 0936 Last data filed at 09/10/16 0730  Gross per 24 hour  Intake                0 ml  Output                0 ml  Net                0 ml   Left AKA dressing changed.  Patient screaming and fighting with dressing change.  Applied 4 x 4 to cover incision.  She would not let me re-wrap the limb even with assistance of nursing staff. Incision healing well, skin appears healthy, stump is viable  Assessment/Planning: POD # 2 Left AKA  Will consult [employer] for sock to make dressing care easier on patient and care givers. Temp feeding tube in place. Speech eval:  Oral Phase Functional Implications: Oral holding Pharyngeal Phase Impairments:  (no spontaneous swallows)   Theda Sers, EMMA MAUREEN 09/10/2016 9:36 AM --  Laboratory Lab Results:  Recent Labs  09/09/16 0210 09/10/16 0147  WBC 8.5 7.9  HGB 8.4* 8.2*  HCT 25.8* 25.5*  PLT 286 270   BMET  Recent Labs  09/09/16 0210 09/10/16 0147  NA 145 148*  K 4.7 4.3  CL 112* 116*  CO2 25 23  GLUCOSE 157* 143*  BUN 19 19  CREATININE 1.00 1.01*  CALCIUM 7.9* 7.7*    COAG Lab Results  Component Value Date   INR 1.30 04/09/2016   INR 1.09 11/02/2015   INR 1.26 03/03/2013   No results found for: PTT  I have independently interviewed patient and agree with PA assessment and plan above.   Giavana Rooke C. Donzetta Matters, MD [facility] [place] Office: [phone] Pager: [phone]

## 2016-09-10 NOTE — Progress Notes (Signed)
Patient's nutrition increased to 12ml. Patient tolerating well, will continue to monitor.

## 2016-09-10 NOTE — Progress Notes (Signed)
Orth[facility] Tech Progress Note Patient Details:  Emily [name] 05-13-1931 [id number]  Patient ID: [name], female   DOB: 11-Aug-1930, 83 y.o.   MRN: [id number]   [place] Pink 09/10/2016, 1:20 PMCalled [employer] for left AKA sock with waist band.

## 2016-09-10 NOTE — Progress Notes (Signed)
L AKA sock with waist band applied with assistance of [employer] employee.   Fritz Pickerel, RN

## 2016-09-10 NOTE — Progress Notes (Signed)
Patient given morning meds, nutrition increased another 89ml to 30ml. MRSA swab sent to lab.Patient tolerating well, will continue to monitor.

## 2016-09-10 NOTE — Progress Notes (Signed)
Updated daughter [name] via telephone. [name] states she is the patient's court appointed legal guardian. Requested copy of paperwork for the chart. [name] states paperwork is in storage.  Fritz Pickerel, RN

## 2016-09-11 ENCOUNTER — Inpatient Hospital Stay (HOSPITAL_COMMUNITY): Payer: Medicare Other

## 2016-09-11 LAB — GLUCOSE, CAPILLARY
GLUCOSE-CAPILLARY: 149 mg/dL — AB (ref 65–99)
GLUCOSE-CAPILLARY: 179 mg/dL — AB (ref 65–99)
GLUCOSE-CAPILLARY: 180 mg/dL — AB (ref 65–99)
Glucose-Capillary: 163 mg/dL — ABNORMAL HIGH (ref 65–99)

## 2016-09-11 NOTE — Progress Notes (Addendum)
[facility] [place]  Subjective  - Resting, new mattress for skin protection, left AKA sock on.  NG tube backed out last night, nurse replaced it and ordered abdominal x ray to confer placement.    Objective (!) 131/59 86 99 F (37.2 C) (Oral) 18 98%  Intake/Output Summary (Last 24 hours) at 09/11/16 0721 Last data filed at 09/11/16 0700  Gross per 24 hour  Intake             1800 ml  Output                0 ml  Net             1800 ml    Lungs rhonchi breathing, decreased breath sound in bases. Left AKA clean and dry with sock in place Heart RRR 80 bpm  Assessment/Planning: POD # 3 left AKA Will check chest x ray for aspiration.  WBC normal, Tm 99 Pending abdominal x ray for placement Left AKA viable.   Laurence Slate [facility] 09/11/2016 7:21 AM --  Laboratory Lab Results:  Recent Labs  09/09/16 0210 09/10/16 0147  WBC 8.5 7.9  HGB 8.4* 8.2*  HCT 25.8* 25.5*  PLT 286 270   BMET  Recent Labs  09/09/16 0210 09/10/16 0147  NA 145 148*  K 4.7 4.3  CL 112* 116*  CO2 25 23  GLUCOSE 157* 143*  BUN 19 19  CREATININE 1.00 1.01*  CALCIUM 7.9* 7.7*    COAG Lab Results  Component Value Date   INR 1.30 04/09/2016   INR 1.09 11/02/2015   INR 1.26 03/03/2013   No results found for: PTT   I have independently interviewed patient and agree with PA assessment and plan above. Feeding tube in place, can resume tube feeds.   Zelma Snead C. Donzetta Matters, MD [facility] [place] Office: [phone] Pager: [phone]

## 2016-09-11 NOTE — Progress Notes (Signed)
SLP Cancellation Note  Patient Details Name: Branae B Niue MRN: [id number] DOB: Dec 01, 1930   Cancelled treatment:       Reason Eval/Treat Not Completed: Fatigue/lethargy limiting ability to participate.  ST will continue to follow for PO readiness.     Shelly Flatten, MA, [facility] SLP [phone] Lamar Sprinkles 09/11/2016, 9:37 AM

## 2016-09-11 NOTE — Progress Notes (Signed)
Per review of Pt record, Pt NG tube should read 90 cm at Pt nare.  Pt with moist respirations.  Current reading at 85 cm.  Call placed to SWOT, advised to readvance NG tube to 90 cm and get abdominal xray.  Will hold TF until verification of NG tube placement.

## 2016-09-11 NOTE — Progress Notes (Signed)
Verbal order from [facility] VVS PA to hold off on morning medicines until CXR results obtained.   Fritz Pickerel, RN

## 2016-09-12 ENCOUNTER — Inpatient Hospital Stay (HOSPITAL_COMMUNITY): Payer: Medicare Other

## 2016-09-12 LAB — GLUCOSE, CAPILLARY
GLUCOSE-CAPILLARY: 193 mg/dL — AB (ref 65–99)
GLUCOSE-CAPILLARY: 210 mg/dL — AB (ref 65–99)
GLUCOSE-CAPILLARY: 199 mg/dL — AB (ref 65–99)
Glucose-Capillary: 196 mg/dL — ABNORMAL HIGH (ref 65–99)

## 2016-09-12 NOTE — Care Management Important Message (Signed)
Important Message  Patient Details  Name: Emily [name] MRN: [id number] Date of Birth: 04/17/1931   Medicare Important Message Given:  Yes    Nathen May 09/12/2016, 3:36 PM

## 2016-09-12 NOTE — Progress Notes (Signed)
NG tube back out 5 cm compared to baseline secondary to patient's coughing. This RN re-advance the tubing to the 90 cm point. Will hold TF until verification of NG tube placement

## 2016-09-12 NOTE — Progress Notes (Signed)
  Speech Language Pathology Treatment: Dysphagia  Patient Details Name: Emily [name] MRN: [id number] DOB: 1930-07-13 Today's Date: 09/12/2016 Time: 1497-0263 SLP Time Calculation (min) (ACUTE ONLY): 27 min  Assessment / Plan / Recommendation Clinical Impression  Treatment focused on readiness for po intake. Patient lethargic but arousable with max cues for brief 3-5 second periods of time. Verbalized "good morning!" when spoken to this am. Through oral care provided with removal of thick, bloody secretions on lips and tongue. Wet vocal quality noted at baseline suggestive of penetration with likely aspiration of saliva increasing risk of an aspiration related infection. Reinforced importance of oral care with both RN and family member. Unit ordering oral care kits for patient use. Ice chip provided and patient with initial manipulation for 3-5 seconds followed by oral holding due to lethargy, inability to maintain alert state despite max cueing. Ice chip suctioned from oral cavity. At this time, patient not appropriate for pos. Will continue to f/u   HPI HPI: 83 y.o.femalefrom SNF admitted for L AKA due to extensive wound of her left foot. She is non-ambulatory and severely demented. Intubated 3/22 for procedure. Swallow evaluation at [facility] 04/11/16 - required careful hand-feeding, pureed food and thin liquids, no overt s/s of aspiration at that time.       SLP Plan  Continue with current plan of care       Recommendations  Diet recommendations: NPO Medication Administration: Via alternative means                Oral Care Recommendations: Oral care QID Follow up Recommendations: Other (comment) (TBD) SLP Visit Diagnosis: Dysphagia, unspecified (R13.10) Plan: Continue with current plan of care       Cannon Ball, [place] [phone]   [place] 09/12/2016, 11:06 AM

## 2016-09-12 NOTE — Progress Notes (Addendum)
[facility]  VASCULAR SURGERY ASSESSMENT & PLAN:   Left BKA is healing nicely.  A little more alert this afternoon.  I had a long discussion with the family. We agree that PEG is not a great idea.   We will give her a few more days to see if her swallowing improves. If so, she can go back the the SNF.  Deitra Mayo, MD, FACS Beeper [phone] Office: [phone]   Subjective  - Non responsive to moving her limb this am.     Objective 136/71 82 97.5 F (36.4 C) (Oral) 16 100%  Intake/Output Summary (Last 24 hours) at 09/12/16 0762 Last data filed at 09/12/16 2633  Gross per 24 hour  Intake           3137.5 ml  Output                0 ml  Net           3137.5 ml    Pulled NG tube again new X ray PORTABLE ABDOMEN - 1 VIEW IMPRESSION: Feeding tube tip noted over the distal stomach/ proximal duodenum. Minimal small bowel distention .  COMPARISON:  Chest radiograph 04/09/2016  FINDINGS: Enteric tube courses inferior to the diaphragm. Monitoring leads overlie the patient. Stable cardiomegaly with tortuosity of the thoracic aorta. Slight interval improvement in right mid lower lung opacities. No pleural effusion.  IMPRESSION: Interval improvement in previously visualized right mid and lower lung opacities.  Right heel with epidermis missing, in protective soft boot Left AKA clean and dry NG feeding tube in place, no rhonchi   Assessment/Planning: POD # 4 left AKA  Cr slight increase 1.01 HGB 8.2  Patient is not progressing, she does not wake up enough to work with speech therapy. If the family wants to continue full treatment measures we need to consider PEG tube placement.   Laurence Slate [facility] 09/12/2016 7:33 AM --  Laboratory Lab Results:  Recent Labs  09/10/16 0147  WBC 7.9  HGB 8.2*  HCT 25.5*  PLT 270   BMET  Recent Labs  09/10/16 0147  NA 148*  K 4.3  CL 116*  CO2 23  GLUCOSE 143*  BUN  19  CREATININE 1.01*  CALCIUM 7.7*    COAG Lab Results  Component Value Date   INR 1.30 04/09/2016   INR 1.09 11/02/2015   INR 1.26 03/03/2013   No results found for: PTT

## 2016-09-12 NOTE — Progress Notes (Signed)
Nutrition Follow Up  DOCUMENTATION CODES:   Obesity unspecified  INTERVENTION:    Continue Jevity 1.2 formula at goal rate of 60 ml/hr with Prostat 30 ml BID via tube  Total TF regimen providing 1928 kcals, 110 gm protein, 1162 ml of free water  NUTRITION DIAGNOSIS:   Inadequate oral intake related to inability to eat as evidenced by NPO status, ongoing  GOAL:   Patient will meet greater than or equal to 90% of their needs, met  MONITOR:   TF tolerance, Labs, Weight trends, I & O's, Skin  ASSESSMENT:   83 y.o. Female with an extensive wound of her left foot. She is non-ambulatory and severely demented. She is in a nursing home. She was not a candidate for revascularization.  Pt s/p procedure 3/22: LEFT ABOVE KNEE AMPUTATION   S/p bedside swallow evaluation.  Pt poorly responsive. Jevity 1.2 formula infusing at goal rate of 60 ml/hr via CORTRAK feeding tube (tip over distal stomach/proximal duodenum). Noted family may need to consider PEG tube placement. Labs reviewed.  Sodium 148 (H). CBG's [id number].  Family member tearful; leaning over patient. RD unable to complete Nutrition-Focused physical exam at this time.   Diet Order:  Diet NPO time specified  Skin:  Sacrum Stage 3 pressure injury, R heel Stage 2 pressure injury  Last BM:  3/22  Height:   Ht Readings from Last 1 Encounters:  09/09/16 '5\' 8"'$  (1.727 m)   Weight:   Wt Readings from Last 1 Encounters:  09/12/16 227 lb (103 kg)   Ideal Body Weight:  63.6 kg  BMI:  36.4 kg/m2 (adjusted for AKA)  Estimated Nutritional Needs:   Kcal:  1800-2000  Protein:  100-110 gm  Fluid:  1.8-2.0 L  EDUCATION NEEDS:   No education needs identified at this time  Arthur Holms, RD, LDN Pager #: [phone] After-Hours Pager #: [phone]

## 2016-09-12 NOTE — Progress Notes (Signed)
Clinical Social Worker received a consult stating "daughter feels patient is not being taken care of at her SNF". CSW contacted patient daughter Emily [name]. Emily [name] stated that she never said that and the facility is taken good care of her mother. Emily [name] stated tat her sisters don't agree with the facility so that is why her sisters are stating that. Emily [name] stated she is the court appointed lawyer for her mother. CSW asked Emily [name] to please bring a copy of the paperwork so it can be placed on patients hard chart. Emily [name] stated that she does not want any [employer] employees talking to her sisters but would rather everything come to her. CSW remains available for support and discharge needs.  Rhea Pink, MSW,  [place]

## 2016-09-13 ENCOUNTER — Inpatient Hospital Stay (HOSPITAL_COMMUNITY): Payer: Medicare Other

## 2016-09-13 ENCOUNTER — Encounter (HOSPITAL_COMMUNITY): Payer: Self-pay | Admitting: Internal Medicine

## 2016-09-13 DIAGNOSIS — E118 Type 2 diabetes mellitus with unspecified complications: Secondary | ICD-10-CM

## 2016-09-13 DIAGNOSIS — T17908D Unspecified foreign body in respiratory tract, part unspecified causing other injury, subsequent encounter: Secondary | ICD-10-CM

## 2016-09-13 DIAGNOSIS — R1310 Dysphagia, unspecified: Secondary | ICD-10-CM

## 2016-09-13 DIAGNOSIS — R1319 Other dysphagia: Secondary | ICD-10-CM

## 2016-09-13 DIAGNOSIS — T17908A Unspecified foreign body in respiratory tract, part unspecified causing other injury, initial encounter: Secondary | ICD-10-CM

## 2016-09-13 DIAGNOSIS — F0280 Dementia in other diseases classified elsewhere without behavioral disturbance: Secondary | ICD-10-CM | POA: Diagnosis present

## 2016-09-13 DIAGNOSIS — Z515 Encounter for palliative care: Secondary | ICD-10-CM

## 2016-09-13 DIAGNOSIS — I10 Essential (primary) hypertension: Secondary | ICD-10-CM

## 2016-09-13 DIAGNOSIS — F0390 Unspecified dementia without behavioral disturbance: Secondary | ICD-10-CM

## 2016-09-13 DIAGNOSIS — G309 Alzheimer's disease, unspecified: Secondary | ICD-10-CM

## 2016-09-13 DIAGNOSIS — F03C0 Unspecified dementia, severe, without behavioral disturbance, psychotic disturbance, mood disturbance, and anxiety: Secondary | ICD-10-CM

## 2016-09-13 LAB — CBC
HCT: 26 % — ABNORMAL LOW (ref 36.0–46.0)
HEMOGLOBIN: 7.8 g/dL — AB (ref 12.0–15.0)
MCH: 27.5 pg (ref 26.0–34.0)
MCHC: 30 g/dL (ref 30.0–36.0)
MCV: 91.5 fL (ref 78.0–100.0)
Platelets: 255 10*3/uL (ref 150–400)
RBC: 2.84 MIL/uL — ABNORMAL LOW (ref 3.87–5.11)
RDW: 16.9 % — ABNORMAL HIGH (ref 11.5–15.5)
WBC: 7.2 10*3/uL (ref 4.0–10.5)

## 2016-09-13 LAB — BASIC METABOLIC PANEL
Anion gap: 5 (ref 5–15)
BUN: 17 mg/dL (ref 6–20)
CHLORIDE: 122 mmol/L — AB (ref 101–111)
CO2: 29 mmol/L (ref 22–32)
Calcium: 7.6 mg/dL — ABNORMAL LOW (ref 8.9–10.3)
Creatinine, Ser: 0.82 mg/dL (ref 0.44–1.00)
GFR calc non Af Amer: >60 mL/min (ref >60)
Glucose, Bld: 210 mg/dL — ABNORMAL HIGH (ref 65–99)
POTASSIUM: 4.1 mmol/L (ref 3.5–5.1)
SODIUM: 156 mmol/L — AB (ref 135–145)

## 2016-09-13 LAB — AMMONIA: AMMONIA: 39 umol/L — AB (ref 9–35)

## 2016-09-13 LAB — GLUCOSE, CAPILLARY
GLUCOSE-CAPILLARY: 211 mg/dL — AB (ref 65–99)
Glucose-Capillary: 215 mg/dL — ABNORMAL HIGH (ref 65–99)
Glucose-Capillary: 218 mg/dL — ABNORMAL HIGH (ref 65–99)
Glucose-Capillary: 65 mg/dL (ref 65–99)
Glucose-Capillary: 78 mg/dL (ref 65–99)

## 2016-09-13 LAB — VALPROIC ACID LEVEL

## 2016-09-13 MED ORDER — SCOPOLAMINE 1 MG/3DAYS TD PT72
1.0000 | MEDICATED_PATCH | TRANSDERMAL | Status: DC
  Administered 2016-09-13: 1.5 mg via TRANSDERMAL
  Filled 2016-09-13: qty 1

## 2016-09-13 MED ORDER — DEXTROSE-NACL 5-0.45 % IV SOLN
INTRAVENOUS | Status: DC
  Administered 2016-09-13: 22:00:00 via INTRAVENOUS

## 2016-09-13 NOTE — Significant Event (Signed)
Rapid Response Event Note  Overview: Time Called: 1310 Arrival Time: 1315 Event Type: Respiratory  Initial Focused Assessment: Patient with labored breathing.  Minimally responsive, opens eyes occasionally.  Unable to maintain secretions.  Thick white secretions. Lung sounds rhonchi and expiratory wheezes. 125/65  HR 88  RR 24  O2 sat 93% on [place] Per staff, mental status is close to patient's baseline   Interventions: Deep oral suction NT suction, large amount of secretions Re Taped Cortrax tube  Patient appears to be breathing a little more comfortably  Per Dr [name] plan medical consult and possible palliative consult.   Plan of Care (if not transferred): RN to call if assistance needed.  Event Summary: Name of Physician Notified: Dr [name] at 1315    at    Outcome: Stayed in room and stabalized  Event End Time: [place]  Raliegh Ip

## 2016-09-13 NOTE — Progress Notes (Signed)
  Speech Language Pathology Treatment: Dysphagia  Patient Details Name: Emily [name] MRN: [id number] DOB: [birth year] Today's Date: 09/13/2016 Time: 4388-8757 SLP Time Calculation (min) (ACUTE ONLY): 29 min  Assessment / Plan / Recommendation Clinical Impression  Unfortunately, pt remains somnolent despite efforts to arouse, reposition, wet wash cloth to face.  Tongue appears to be edematous today.  RN phoned MD and is getting pulse oximetry.  No overt signs of airway difficulty, but pt remains with poor secretion management, requiring oral suctioning.  No recognition of stimuli at lips/tongue; no spontaneous swallowing observed.  If not improvements next few days, consider Palliative Care consult.     HPI HPI: 83 y.o.[facility]emalefrom SNF admitted for L AKA due to extensive wound of her left foot. She is non-ambulatory and severely demented. Intubated 3/22 for procedure. Swallow evaluation at [facility] 04/11/16 - required careful hand-feeding, pureed food and thin liquids, no overt s/s of aspiration at that time.       SLP Plan  Continue with current plan of care       Recommendations  Diet recommendations: NPO                Oral Care Recommendations: Oral care QID SLP Visit Diagnosis: Dysphagia, unspecified (R13.10) Plan: Continue with current plan of care       GO               Emily [name], [place] CCC/SLP Pager [phone]  Emily [name] 09/13/2016, 11:45 AM

## 2016-09-13 NOTE — Consult Note (Signed)
[place] Consultation  Emily [name] VCB:[id number] DOB: 01-27-1931 DOA: 09/05/2016 PCP: [name], MD   Requesting physician: [name] Date of consultation: 09/13/16 Reason for consultation: "tune up"  Impression/Recommendations Principal Problem:   Dysphagia Active Problems:   DM (diabetes mellitus), type 2 with renal complications ([place])   Essential hypertension   Diabetic foot ulcer ([place])   Alzheimer disease  1. dysphasia. Worsening since L AKA 4 days ago. Patient has baseline dysphagia and end-stage Alzheimer's. Patient evaluated by speech therapy 1 day postop who recommended temporary enteral feedings and monitoring for readiness to take by mouth. Today patient having difficulty managing her own secretions. Family indicates that her level of consciousness is very close to baseline and in the past while somnolent the majority of the time, she would wake up to eat. -CT of the head evaluate for  neuro event -Chest x-ray -Discontinue tube feedings as she is a very high risk for aspiration -Scopolamine patch -cbc, bmet -Palliative care consult  #2. Alzheimer disease. End-stage. Chart review indicates patient severely demented and nonambulatory. -palliative care for goals of care vs comfort   TRH will followup again tomorrow. Thank you for this consultation.  Chief Complaint: dysphagia  HPI:  Patient is an 83 year old with history of end-stage Alzheimer's, diabetes, hypertension, underwent left AKA 4 days ago for gangrenous foot ulcer. Has demonstrated worsening level of dysphasia since procedure.   Information is obtained from the chart and the nurse. Nurse reports family states that patient's level of consciousness is not that far off baseline but her swallowing is much worse. Patient has had dysphasia prior to procedure but today she has developed inability to manage her own secretions. Chart review indicates she was evaluated by speech the day after  procedure who recommended temporary enteral feeding in the hopes that swallowing will improve an oral diet can be resumed. PEG not recommended even her advanced dementia. They also recommended palliative care prognosis is poor. 4 days later reevaluated and swallowing has not improved.  Chart review indicates family has not been amenable to palliative care consult.  Review of Systems:  Unable to obtain due to advanced dementia  Past Medical History:  Diagnosis Date  . Acute respiratory failure (Emily [name])   . Alzheimer disease   . Anxiety   . Bradycardia   . CAD (coronary artery disease)    Moderate coronary artery disease status post angioplasty to the right coronary artery in 2003 and an ejection fraction 46% on catheterization in 2006.  . CKD (chronic kidney disease)   . Depression    in remission  . Diabetes mellitus   . Duodenal erosion 2009   EGD by Dr. Deatra [name]  . Dysphasia   . Gangrene (Emily [name])   . Gastric erosion 2009   EGD by Dr. Deatra [name]  . Hypertension   . Hypothermia   . Hypothyroidism   . Osteoarthritis   . Perforated ulcer (Emily [name]) 2002   Perforated pyloric channel ulcer patched by Dr. [name]  . Psoriasis   . Systolic CHF (Emily [name])    EF as low is 45%, but 55% in 2009   Past Surgical History:  Procedure Laterality Date  . AMPUTATION Left 09/01/2016   Procedure: AMPUTATION ABOVE KNEE;  Surgeon: Angelia Mould, MD;  Location: [facility];  Service: Vascular;  Laterality: Left;  . JOINT REPLACEMENT    . TOTAL HIP ARTHROPLASTY Right 2007   Social History:  reports that she has never smoked. She has never used smokeless tobacco. She reports  that she does not drink alcohol or use drugs.  Allergies  Allergen Reactions  . Lactose Intolerance (Gi)    Family History  Problem Relation Age of Onset  . Cancer Mother     Prior to Admission medications   Medication Sig Start Date End Date Taking? Authorizing Provider  acetaminophen (TYLENOL) 325 MG tablet Take 650 mg by mouth every 6  (six) hours as needed for moderate pain.    Yes Historical Provider, MD  acetaminophen (TYLENOL) 650 MG CR tablet Take 650 mg by mouth every 4 (four) hours as needed for pain or fever.   Yes Historical Provider, MD  allopurinol (ZYLOPRIM) 300 MG tablet Take 150 mg by mouth daily.   Yes Historical Provider, MD  Amino Acids-Protein Hydrolys (FEEDING SUPPLEMENT, PRO-STAT SUGAR FREE 64,) LIQD Take 30 mLs by mouth 2 (two) times daily.   Yes Historical Provider, MD  amLODipine (NORVASC) 10 MG tablet Take 10 mg by mouth daily.    Yes Historical Provider, MD  ammonium lactate (AMLACTIN) 12 % cream Apply 1 g topically 2 (two) times daily. body   Yes Historical Provider, MD  bag balm OINT ointment Apply 1 application topically 2 (two) times daily. legs   Yes Historical Provider, MD  carvedilol (COREG) 12.5 MG tablet Take 12.5 mg by mouth 2 (two) times daily with a meal.  10/12/10  Yes Emily Bravo, MD  carvedilol (COREG) 6.25 MG tablet Take 6.25 mg by mouth 2 (two) times daily with a meal.   Yes Historical Provider, MD  cetirizine (ZYRTEC) 5 MG tablet Take 5 mg by mouth daily.   Yes Historical Provider, MD  cholecalciferol (VITAMIN D) 1000 UNITS tablet Take 1,000 Units by mouth daily.    Yes Historical Provider, MD  divalproex (DEPAKOTE SPRINKLE) 125 MG capsule Take 250 mg by mouth 3 (three) times daily.   Yes Historical Provider, MD  donepezil (ARICEPT) 10 MG tablet Take 10 mg by mouth at bedtime.    Yes Historical Provider, MD  furosemide (LASIX) 20 MG tablet Take 20 mg by mouth daily.   Yes Historical Provider, MD  guaifenesin (ROBITUSSIN) 100 MG/5ML syrup Take 200 mg by mouth every 6 (six) hours as needed for cough.   Yes Historical Provider, MD  levothyroxine (SYNTHROID, LEVOTHROID) 50 MCG tablet Take 1 tablet (50 mcg total) by mouth daily before breakfast. 04/17/16  Yes Emily Bathe, MD  LORazepam (ATIVAN) 0.5 MG tablet Take 0.5 mg by mouth at bedtime.   Yes Historical Provider, MD  losartan (COZAAR)  100 MG tablet Take 100 mg by mouth daily.    Yes Historical Provider, MD  memantine (NAMENDA XR) 14 MG CP24 24 hr capsule Take 14 mg by mouth daily.   Yes Historical Provider, MD  Menthol, Topical Analgesic, (BIOFREEZE EX) Apply 1 application topically 2 (two) times daily. Knees   Yes Historical Provider, MD  mineral oil external liquid Place 2 drops into both ears 2 (two) times daily.   Yes Historical Provider, MD  Multiple Vitamins-Minerals (DECUBI-VITE PO) Take 1 tablet by mouth daily.   Yes Historical Provider, MD  risperiDONE (RISPERDAL) 1 MG/ML oral solution Take 0.5 mg by mouth 2 (two) times daily. Mix in drink   Yes Historical Provider, MD  traZODone (DESYREL) 150 MG tablet Take 150 mg by mouth at bedtime.   Yes Historical Provider, MD   Physical Exam: Blood pressure (!) 130/48, pulse 81, temperature 98.9 F (37.2 C), temperature source Oral, resp. rate 18, height 5\' 8"  (1.727  m), weight 103.9 kg (229 lb), SpO2 98 %. Vitals:   09/12/16 2115 09/13/16 0406  BP: (!) 97/51 (!) 130/48  Pulse: 78 81  Resp: 18 18  Temp: 98.2 F (36.8 C) 98.9 F (37.2 C)     General:  Obese in bed with head of bed elevated eyes closed drooling no acute distress  Eyes: no scleral icterus PERRL difficult to open  ENT: ears clear, nose without drainage. Mouth pink membranes, copious secretions drooling out of mouth enteral feeding tube intact.   Neck: supple no lymphadenopathy  Cardiovascular: rrr no MGR staples to L AKA clean and dry, right leg no edema  Respiratory: mild increased work of breathing. Diffuse rhonchi, no crackles  Abdomen: obese +bowel sounds   Skin: no rash  Musculoskeletal: left AKA otherwise joints without swelling/erythema  Psychiatric: n/a  Neurologic: facial grimace to vigorous sternal rub. Unable to open eyes. Attempts to close mouth during oral exam. Otherwise no response  Labs on Admission:  Basic Metabolic Panel:  Recent Labs Lab 08/20/2016 1001 08/28/2016 1558  09/09/16 0210 09/10/16 0147  NA 147*  --  145 148*  K 4.1  --  4.7 4.3  CL 113*  --  112* 116*  CO2 23  --  25 23  GLUCOSE 219*  --  157* 143*  BUN 18  --  19 19  CREATININE 0.88 0.90 1.00 1.01*  CALCIUM 8.6*  --  7.9* 7.7*   Liver Function Tests:  Recent Labs Lab 08/31/2016 1001  AST 10*  ALT 10*  ALKPHOS 51  BILITOT 0.6  PROT 7.2  ALBUMIN 1.7*   No results for input(s): LIPASE, AMYLASE in the last 168 hours. No results for input(s): AMMONIA in the last 168 hours. CBC:  Recent Labs Lab 08/31/2016 1001 09/06/2016 1558 09/09/16 0210 09/10/16 0147  WBC 8.6 7.0 8.5 7.9  HGB 7.1* 8.8* 8.4* 8.2*  HCT 21.8* 26.9* 25.8* 25.5*  MCV 84.8 85.1 85.1 88.2  PLT 357 292 286 270   Cardiac Enzymes: No results for input(s): CKTOTAL, CKMB, CKMBINDEX, TROPONINI in the last 168 hours. BNP: Invalid input(s): POCBNP CBG:  Recent Labs Lab 09/12/16 1151 09/12/16 1647 09/12/16 2113 09/13/16 0618 09/13/16 1144  GLUCAP 196* 210* 199* 215* 218*    Radiological Exams on Admission: Dg Abd Portable 1v  Result Date: 09/12/2016 CLINICAL DATA:  Nasogastric tube placement. EXAM: PORTABLE ABDOMEN - 1 VIEW COMPARISON:  09/11/2016 . FINDINGS: Feeding tube tip noted over the distal stomach/ proximal duodenum. Minimal small bowel distention. Stool in the colon. No free air. Aortoiliac and visceral vascular calcification. Postsurgical changes right hip. IMPRESSION: Feeding tube tip noted over the distal stomach/ proximal duodenum. Minimal small bowel distention . Electronically Signed   By: Marcello Moores  Register   On: 09/12/2016 06:58    EKG:   Time spent:   [facility]   If 7PM-7AM, please contact night-coverage [email or web address] Password [facility] 09/13/2016, 2:58 PM

## 2016-09-13 NOTE — Progress Notes (Signed)
ADDENDUM:  Called bc/ she is not able to handle her secretions. As per Speech Pathology, no progress with swallowing. Family has not been amenable to Palliative Care consult. Given her dementia and markedly debilitated state, she is not a good candidate for a PEG.   Will ask for help from [facility]. If they have nothing to offer, will need to convince family that Palliative Care is really the only option.  Deitra Mayo, MD, [place] [phone] Office: [phone]

## 2016-09-13 NOTE — Clinical Social Work Note (Signed)
Patient's daughter, [name], provided documentation showing that she is the patient's legal guardian. CSW made a copy and put it on the front of her chart.  Dayton Scrape, [place]

## 2016-09-13 NOTE — Progress Notes (Addendum)
[facility] [place]:   More alert today.   Will ask Speech Pathology to continue to work with her. Would like to get her back to her same SNF once she is able to eat safely.  Cont TF's for now.   Deitra Mayo, MD, FACS [place] [phone] Office: [phone]   Subjective  - Cried out once with dressing change.   Objective (!) 130/48 81 98.9 F (37.2 C) (Oral) 18 98% No intake or output data in the 24 hours ending 09/13/16 0749  Left AKA healing well Right heel protected Heart RRR Lungs Rhonchi breathing  Assessment/Planning: POD # 5 left AKA  NG tube in place Per Dr. Nicole Cella note yesterday: "I had a long discussion with the family. We agree that PEG is not a great idea.   We will give her a few more days to see if her swallowing improves. If so, she can go back the the SNF."  Laurence Slate [facility] 09/13/2016 7:49 AM --  Laboratory Lab Results: No results for input(s): WBC, HGB, HCT, PLT in the last 72 hours. BMET No results for input(s): NA, K, CL, CO2, GLUCOSE, BUN, CREATININE, CALCIUM in the last 72 hours.  COAG Lab Results  Component Value Date   INR 1.30 04/09/2016   INR 1.09 11/02/2015   INR 1.26 03/03/2013   No results found for: PTT

## 2016-09-13 NOTE — Progress Notes (Signed)
Patient noted to have increased work of breathing despite hourly oral suctioning.  O2 sats were in the low 80s.  Patient was on 2 L [place].  [place] O2 was titrated up to 4L.  Rapid Response was called.  Vascular PA was called.  Patient underwent deep suctioning and O2 saturation rebounded to mid-90s.  Will continue to monitor.

## 2016-09-14 LAB — BLOOD GAS, ARTERIAL
Acid-Base Excess: 0.2 mmol/L (ref 0.0–2.0)
Bicarbonate: 28.6 mmol/L — ABNORMAL HIGH (ref 20.0–28.0)
Drawn by: 419771
FIO2: 100
O2 Saturation: 97.5 %
PATIENT TEMPERATURE: 98.6
pCO2 arterial: 89.2 mmHg (ref 32.0–48.0)
pH, Arterial: 7.133 — CL (ref 7.350–7.450)
pO2, Arterial: 135 mmHg — ABNORMAL HIGH (ref 83.0–108.0)

## 2016-09-14 LAB — GLUCOSE, CAPILLARY
GLUCOSE-CAPILLARY: 120 mg/dL — AB (ref 65–99)
GLUCOSE-CAPILLARY: 220 mg/dL — AB (ref 65–99)

## 2016-09-14 MED ORDER — MORPHINE SULFATE (PF) 2 MG/ML IV SOLN
2.0000 mg | Freq: Once | INTRAVENOUS | Status: AC
  Administered 2016-09-14: 2 mg via INTRAVENOUS
  Filled 2016-09-14: qty 1

## 2016-09-18 NOTE — Progress Notes (Signed)
Called to bedside by rapid response due to pt being unresponsive with low sats. Upon my arrival pt was being bagged by RN's. At that time daughter wished to proceed with BIPAP despite the pt being a DNR. The pt was placed on BIPAP with no improvement and an ABG was drawn. The daughter consulted with other family and decided to make her comfort care. The BIPAP was removed and a morphine drip was ordered.

## 2016-09-18 NOTE — Discharge Summary (Signed)
Patient ID: Emily [name] MRN: [id number] DOB/AGE: 81-Dec-1932 81 y.o.  Admit date: 10-01-2016 Date of death: 10/07/16  Admission Diagnosis: Left foot ulcer L97.409  Discharge Diagnoses:  Left foot ulcer L97.409  Secondary Diagnoses: Past Medical History:  Diagnosis Date  . Acute respiratory failure ([place])   . Alzheimer disease   . Anxiety   . Bradycardia   . CAD (coronary artery disease)    Moderate coronary artery disease status post angioplasty to the right coronary artery in 2003 and an ejection fraction 46% on catheterization in 2006.  . CKD (chronic kidney disease)   . Depression    in remission  . Diabetes mellitus   . Duodenal erosion 2009   EGD by Dr. [name]  . Dysphasia   . Gangrene ([place])   . Gastric erosion 2009   EGD by Dr. [name]  . Hypertension   . Hypothermia   . Hypothyroidism   . Osteoarthritis   . Perforated ulcer ([place]) 2002   Perforated pyloric channel ulcer patched by Dr. [name]  . Psoriasis   . Systolic CHF ([place])    EF as low is 45%, but 55% in 2009    Procedures: 2016-10-01 Surgeon(s): Angelia Mould, MD Left AKA  HPI: This is a markedly debilitated 83 year old nursing home patient with severe dementia, who presented with a gangrenous wound on her left foot. I saw her on 08/25/2016. She was clearly not a candidate for revascularization. We discussed palliative care versus above-the-knee amputation and the family decided to proceed with above-the-knee amputation.   Hospital Course: The patient was admitted to the hospital and underwent a left above-the-knee amputation on 10/01/16. Postoperatively she does not appear to be swallowing well and was sometimes difficult to arouse. She did have dysphagia problems preoperatively but this appeared to be worsened postoperatively. A temporary feeding tube was placed for nutrition but she was not felt to be a candidate for a PEG. On 09/13/2016 she was having problems even managing her secretions and  medicine was consult. The family elected to continue with aggressive treatment however her status continued to deteriorate and this morning at 5:20 AM she was pronounced dead after the family had decided not to continue aggressive support.   Consults: [facility]   Significant Diagnostic Studies:  Ct Head Wo Contrast  Result Date: Oct 07, 2016 CLINICAL DATA:  Acute onset of worsening cognitive function after surgery. Initial encounter. EXAM: CT HEAD WITHOUT CONTRAST TECHNIQUE: Contiguous axial images were obtained from the base of the skull through the vertex without intravenous contrast. COMPARISON:  CT of the head performed 04/09/2016 FINDINGS: Brain: No evidence of acute infarction, hemorrhage, hydrocephalus, extra-axial collection or mass lesion/mass effect. Prominence of the ventricles and sulci reflects moderate cortical volume loss. Mild cerebellar atrophy is noted. Scattered periventricular and subcortical matter change likely reflects small vessel ischemic microangiopathy. The brainstem and fourth ventricle are within normal limits. The basal ganglia are unremarkable in appearance. The cerebral hemispheres demonstrate grossly normal gray-white differentiation. No mass effect or midline shift is seen. Vascular: No hyperdense vessel or unexpected calcification. Skull: There is no evidence of fracture; visualized osseous structures are unremarkable in appearance. Sinuses/Orbits: The orbits are within normal limits. The paranasal sinuses and mastoid air cells are well-aerated. Other: No significant soft tissue abnormalities are seen. IMPRESSION: 1. No acute intracranial pathology seen on CT. 2. Moderate cortical volume loss and scattered small vessel ischemic microangiopathy. Electronically Signed   By: Garald Balding M.D.   On: 07-Oct-2016 00:42   Dg  Chest Port 1 View  Result Date: 09/13/2016 CLINICAL DATA:  Cough. EXAM: PORTABLE CHEST 1 VIEW COMPARISON:  09/11/2016 and 04/09/2016. FINDINGS:  1526 hours. Feeding tube projects below the diaphragm, tip not visualized. There are persistent low lung volumes with vascular congestion and probable patchy bibasilar atelectasis. There is no overt pulmonary edema, confluent airspace opacity, pneumothorax or significant pleural effusion. The heart remains enlarged. There are glenohumeral degenerative changes bilaterally. IMPRESSION: Patchy probable bibasilar atelectasis. No focal airspace disease or edema. Stable cardiomegaly. Electronically Signed   By: Richardean Sale M.D.   On: 09/13/2016 15:39    CBC    Component Value Date/Time   WBC 7.2 09/13/2016 1510   RBC 2.84 (L) 09/13/2016 1510   HGB 7.8 (L) 09/13/2016 1510   HCT 26.0 (L) 09/13/2016 1510   PLT 255 09/13/2016 1510   MCV 91.5 09/13/2016 1510   MCH 27.5 09/13/2016 1510   MCHC 30.0 09/13/2016 1510   RDW 16.9 (H) 09/13/2016 1510   LYMPHSABS 0.7 04/09/2016 1713   MONOABS 0.4 04/09/2016 1713   EOSABS 0.0 04/09/2016 1713   BASOSABS 0.0 04/09/2016 1713    BMET    Component Value Date/Time   NA 156 (H) 09/13/2016 1510   K 4.1 09/13/2016 1510   CL 122 (H) 09/13/2016 1510   CO2 29 09/13/2016 1510   GLUCOSE 210 (H) 09/13/2016 1510   BUN 17 09/13/2016 1510   CREATININE 0.82 09/13/2016 1510   CALCIUM 7.6 (L) 09/13/2016 1510   GFRNONAA >60 09/13/2016 1510   GFRAA >60 09/13/2016 1510    COAG Lab Results  Component Value Date   INR 1.30 04/09/2016   INR 1.09 11/02/2015   INR 1.26 03/03/2013       Follow-up Information    Deitra Mayo, MD In 4 weeks.   Specialties:  Vascular Surgery, Cardiology Why:  Office will call you to arrange your appt (sent) Contact information: [address] [place] [address] [phone]           Signed: Deitra Mayo 10/06/16, 7:33 AM

## 2016-09-18 NOTE — Progress Notes (Signed)
VASCULAR SURGERY:  This patient has continued to deteriorate. Yesterday she was unable to handle her secretions. Given her severe dementia and markedly debilitated states was not a candidate for a PEG. This morning she developed labored breathing and became unresponsive. After discussion with the family the decision was made to not treat this aggressively. She expired at 5:20 AM. I have discussed this with the family he was at the bedside.  Deitra Mayo, MD, [place] [phone] Office: [phone]

## 2016-09-18 NOTE — Significant Event (Signed)
Rapid Response Event Note:  While rounding on floor Butch Penny, RN requested I see this pt due to overall deteriorating status.  Overview: Time Called: 0410 Arrival Time: 0410 Event Type: Respiratory, Hypotension  Initial Focused Assessment: At bedside pt is nonresponsive at baseline with skin hot to touch, diaphoretic with labored guppy  breathing  RR 16 and O2 sats 58% on 2 L [place].  BP 82/56.  Pt is  DNR status   Interventions: Manual bagging and placement on BIPAP at daughter Caroline's request, ABG: 7.13  PCO2 89 PO2 135. NS 250 cc bolus, Rectal temp 101.3  Tylenol 650 suppository given After discussion with Ferdinand Cava, NP and daughter Chrys Racer decision was made to withdraw BIPAP and provide comfort care and Morphine as needed for air hunger.  Plan of Care (if not transferred): Allow for a peaceful death.    Pt expired at 23 with daughter at bedside. Butch Penny, RN to contact vascular surgeon regarding pt demise  Event Summary: Name of Physician Notified: Chaney Malling, NP at [address]

## 2016-09-18 NOTE — Care Management Note (Signed)
Case Management Note Marvetta Gibbons RN, BSN Unit 2W-Case Manager [phone]  Patient Details  Name: Emily [name] MRN: [id number] Date of Birth: April 11, 1931  Subjective/Objective:  Pt admitted s/p AKA                  Action/Plan: PTA pt was at [facility], [place] consulted for placement needs- pt will need to return to SNF when medically stable  Expected Discharge Date:                  Expected Discharge Plan:  Skilled Nursing Facility  In-House Referral:  Clinical Social Work  Discharge planning Services     Post Acute Care Choice:  NA Choice offered to:  NA  DME Arranged:    DME Agency:     HH Arranged:    [place] Agency:     Status of Service:  Completed, signed off  If discussed at [employer] of Stay Meetings, dates discussed:  09/13/16  Discharge Disposition: expired   Additional Comments:  09/12/16- 1400- Marvetta Gibbons RN,, CM- pt continues not to progress- on TF via NGT- swallowing not improving- per MD note not a good candidate for PEG- CSW continues to follow for d/c needs- will need to have some final discussion with family on plan of care.   Dawayne Patricia, RN 10-10-16, 11:22 AM

## 2016-09-18 NOTE — Consult Note (Signed)
Palliative Consultation Reason: Terminal Care Requested by: Emily Carrel, NP  Patient was seen and there was no family at bedside-her daughter is a bus driver and unavailable at the time of my visit. I reviewed the documentation and discussed her case at length with the bedside nurse. Emily [name] appears to be actively dying and unresponsive. She is not having distress but has a slight grimace on her face. I agree with the plans set forth by the [facility] team and appreciate their contact with this family for goals of care-patient is a DNR-our focus should be on comfort. Discussed use of IV PRNs with care team and should she decline we can consider additional comfort medications. PPS score is 10%. She is appropriate for a hospice facility if family willing. I will reach out to them in AM re: meeting to discuss our goals of care. In the past they have refused palliative consultation.  Time: 30 minutes Greater than 50%  of this time was spent counseling and coordinating care related to the above assessment and plan.  Emily Hacker, DO Palliative Medicine [phone]

## 2016-09-18 NOTE — Progress Notes (Signed)
Post-mortem checklist complete. Pt's body prepared and taken to morgue.   Grant Fontana BSN, RN

## 2016-09-18 DEATH — deceased

## 2018-02-20 IMAGING — CR DG FEMUR 2+V*L*
4 series · 4 of 4 positions shown · non-contrast
Comparison: None.

CLINICAL DATA: Left hip pain without injury

EXAM:
LEFT FEMUR 2 VIEWS

[t femur proximal ap left]
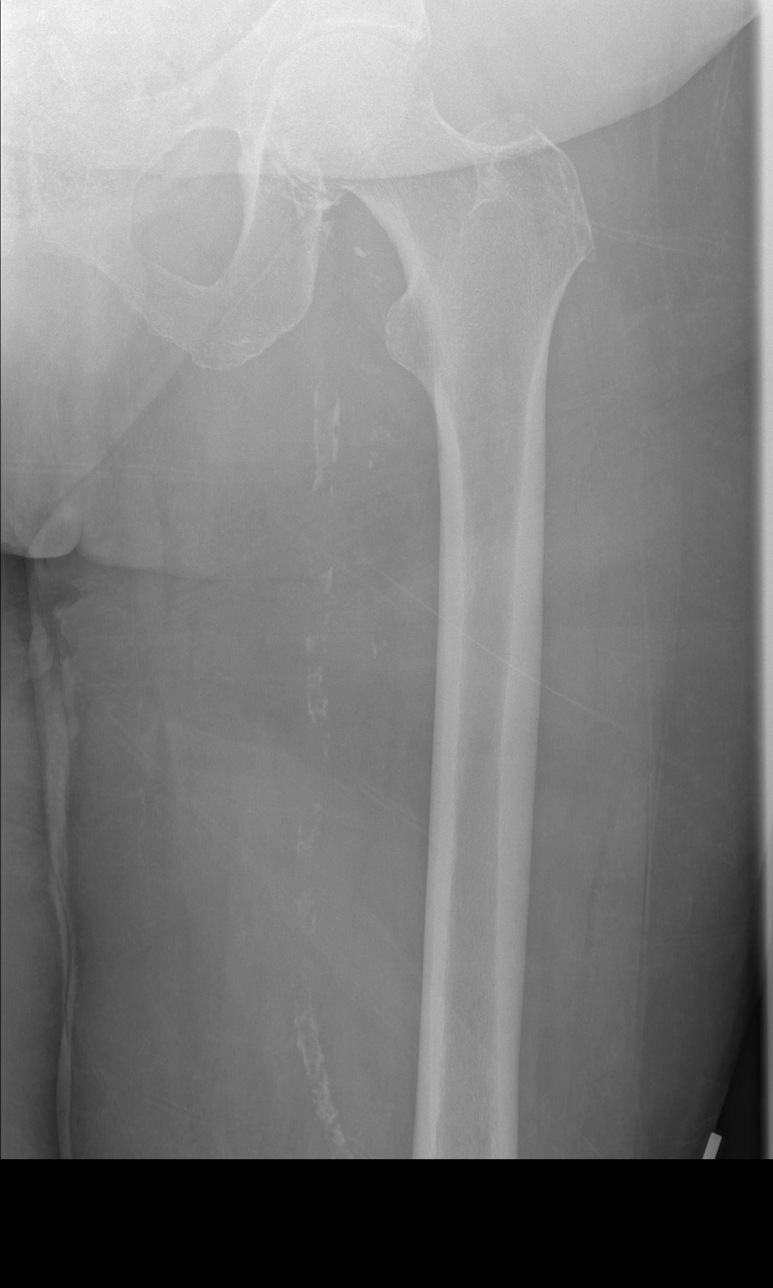

[t femur distal ap left]
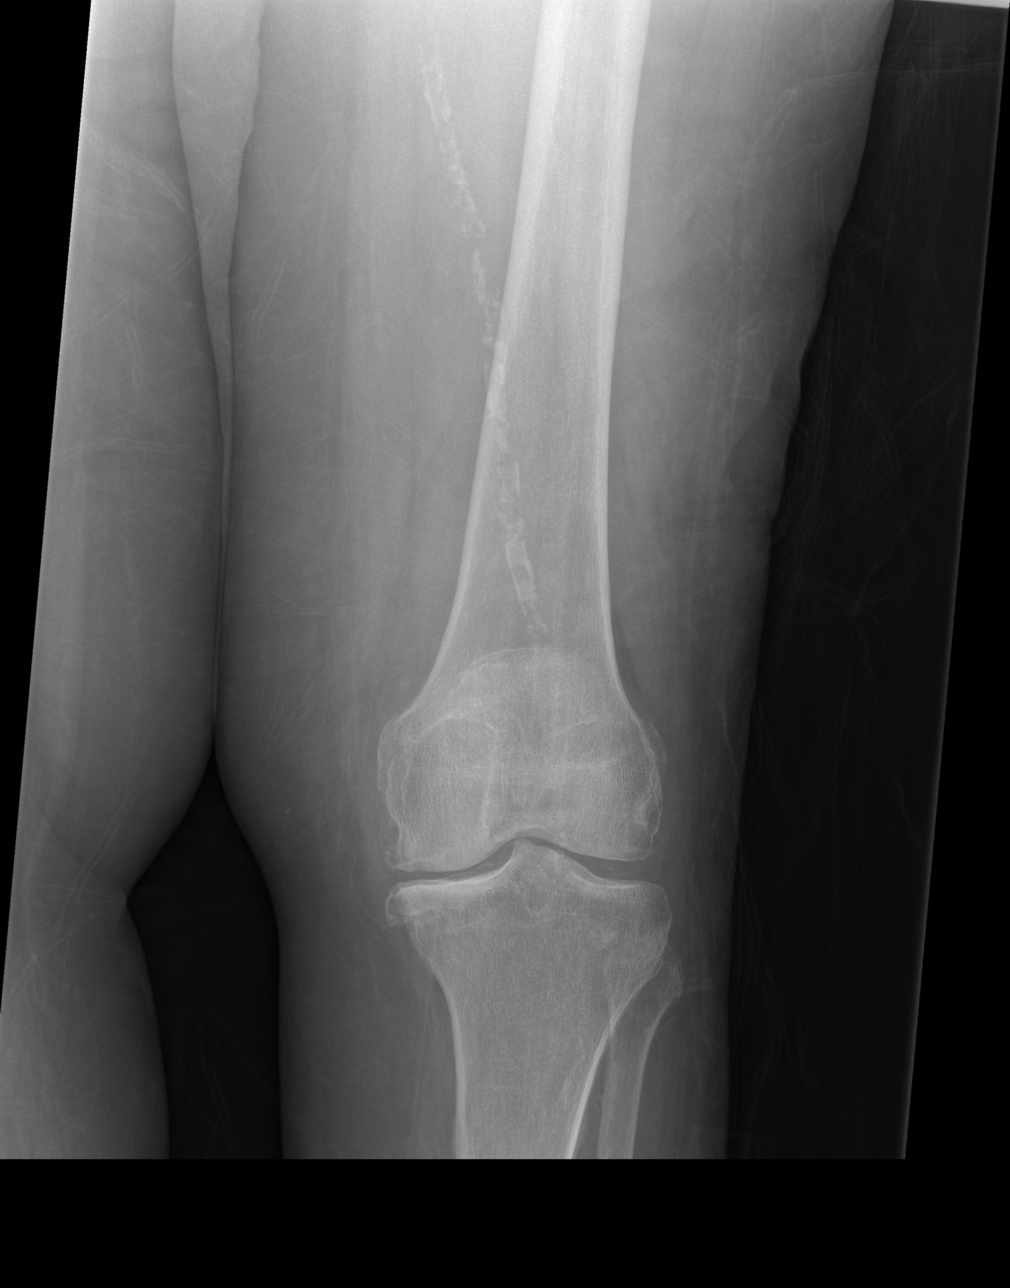

[w femur distal lat left]
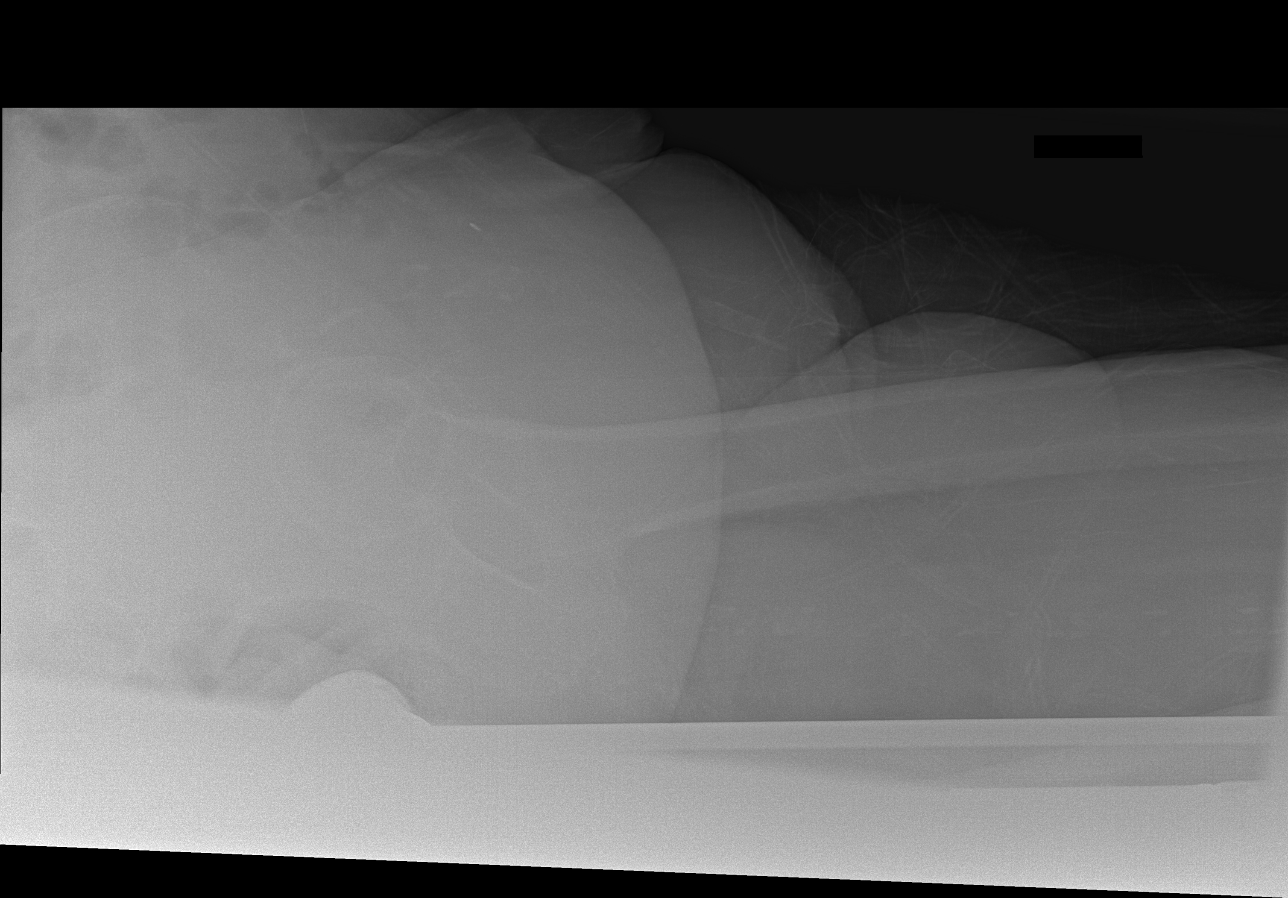

[x femur proximal ap left]
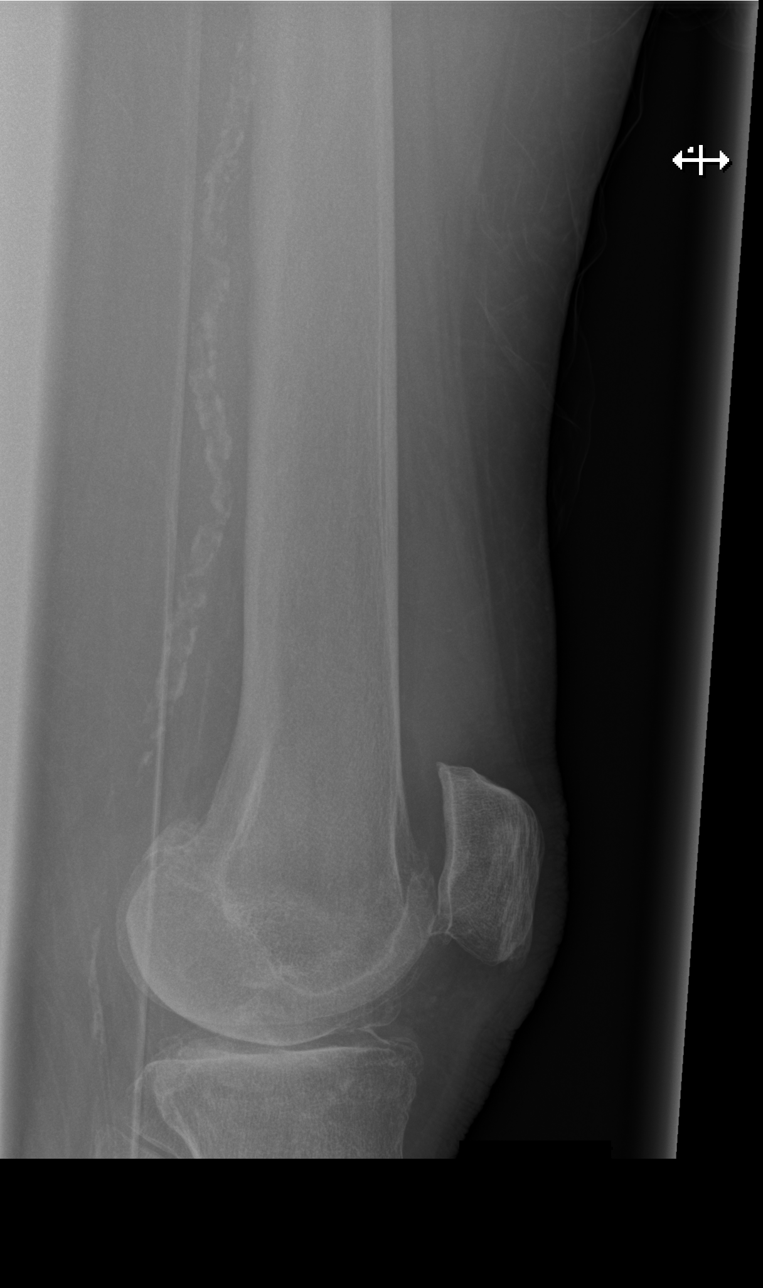

[4 of 4 positions shown; findings below may reference images not displayed]

FINDINGS: No fracture or dislocation is seen.

Mild degenerative changes of the left hip.

Moderate degenerative changes of the left knee.

Visualized soft tissues are notable for vascular calcifications.
IMPRESSION: No fracture or dislocation is seen.

Mild degenerative changes of the left hip.

Moderate degenerative changes of the left knee.
